# Patient Record
Sex: Female | Born: 1945
Health system: Southern US, Community
[De-identification: ages and names within clinical notes are randomized; demographics above are authoritative.]

## PROBLEM LIST (undated history)

## (undated) DIAGNOSIS — E079 Disorder of thyroid, unspecified: Secondary | ICD-10-CM

## (undated) DIAGNOSIS — K5790 Diverticulosis of intestine, part unspecified, without perforation or abscess without bleeding: Secondary | ICD-10-CM

## (undated) DIAGNOSIS — I10 Essential (primary) hypertension: Secondary | ICD-10-CM

## (undated) DIAGNOSIS — H269 Unspecified cataract: Secondary | ICD-10-CM

## (undated) DIAGNOSIS — Z87442 Personal history of urinary calculi: Secondary | ICD-10-CM

## (undated) DIAGNOSIS — I2510 Atherosclerotic heart disease of native coronary artery without angina pectoris: Secondary | ICD-10-CM

## (undated) DIAGNOSIS — I350 Nonrheumatic aortic (valve) stenosis: Secondary | ICD-10-CM

## (undated) DIAGNOSIS — C801 Malignant (primary) neoplasm, unspecified: Secondary | ICD-10-CM

## (undated) DIAGNOSIS — M1990 Unspecified osteoarthritis, unspecified site: Secondary | ICD-10-CM

## (undated) DIAGNOSIS — Z8719 Personal history of other diseases of the digestive system: Secondary | ICD-10-CM

## (undated) DIAGNOSIS — N390 Urinary tract infection, site not specified: Secondary | ICD-10-CM

## (undated) DIAGNOSIS — M109 Gout, unspecified: Secondary | ICD-10-CM

## (undated) DIAGNOSIS — E785 Hyperlipidemia, unspecified: Secondary | ICD-10-CM

## (undated) DIAGNOSIS — I639 Cerebral infarction, unspecified: Secondary | ICD-10-CM

## (undated) DIAGNOSIS — R0989 Other specified symptoms and signs involving the circulatory and respiratory systems: Secondary | ICD-10-CM

## (undated) DIAGNOSIS — F40240 Claustrophobia: Secondary | ICD-10-CM

## (undated) DIAGNOSIS — Z8249 Family history of ischemic heart disease and other diseases of the circulatory system: Secondary | ICD-10-CM

## (undated) DIAGNOSIS — R011 Cardiac murmur, unspecified: Secondary | ICD-10-CM

## (undated) DIAGNOSIS — K589 Irritable bowel syndrome without diarrhea: Secondary | ICD-10-CM

## (undated) DIAGNOSIS — K219 Gastro-esophageal reflux disease without esophagitis: Secondary | ICD-10-CM

## (undated) HISTORY — PX: CARDIAC CATHETERIZATION: SHX172

## (undated) HISTORY — PX: COLONOSCOPY: SHX174

## (undated) HISTORY — DX: Gout, unspecified: M10.9

## (undated) HISTORY — DX: Gastro-esophageal reflux disease without esophagitis: K21.9

## (undated) HISTORY — DX: Cerebral infarction, unspecified: I63.9

## (undated) HISTORY — PX: OTHER SURGICAL HISTORY: SHX169

## (undated) HISTORY — PX: OOPHORECTOMY: SHX86

## (undated) HISTORY — DX: Hyperlipidemia, unspecified: E78.5

## (undated) HISTORY — DX: Unspecified cataract: H26.9

## (undated) HISTORY — PX: BLADDER SURGERY: SHX569

## (undated) HISTORY — PX: PARTIAL HYSTERECTOMY: SHX80

## (undated) HISTORY — PX: APPENDECTOMY: SHX54

## (undated) HISTORY — DX: Nonrheumatic aortic (valve) stenosis: I35.0

## (undated) HISTORY — DX: Family history of ischemic heart disease and other diseases of the circulatory system: Z82.49

## (undated) HISTORY — PX: EYE SURGERY: SHX253

## (undated) HISTORY — DX: Urinary tract infection, site not specified: N39.0

---

## 1999-02-15 ENCOUNTER — Other Ambulatory Visit: Admission: RE | Admit: 1999-02-15 | Discharge: 1999-02-15 | Payer: Self-pay | Admitting: Gastroenterology

## 1999-08-26 ENCOUNTER — Other Ambulatory Visit: Admission: RE | Admit: 1999-08-26 | Discharge: 1999-08-26 | Payer: Self-pay | Admitting: Family Medicine

## 1999-10-06 ENCOUNTER — Ambulatory Visit: Admission: RE | Admit: 1999-10-06 | Discharge: 1999-10-06 | Payer: Self-pay | Admitting: Gynecology

## 1999-10-08 ENCOUNTER — Encounter: Admission: RE | Admit: 1999-10-08 | Discharge: 1999-10-08 | Payer: Self-pay | Admitting: Gynecology

## 1999-10-11 ENCOUNTER — Encounter: Payer: Self-pay | Admitting: Obstetrics and Gynecology

## 1999-10-11 ENCOUNTER — Ambulatory Visit: Admission: RE | Admit: 1999-10-11 | Discharge: 1999-10-11 | Payer: Self-pay | Admitting: Obstetrics and Gynecology

## 1999-10-19 ENCOUNTER — Inpatient Hospital Stay (HOSPITAL_COMMUNITY): Admission: RE | Admit: 1999-10-19 | Discharge: 1999-10-22 | Payer: Self-pay | Admitting: Obstetrics and Gynecology

## 1999-10-19 ENCOUNTER — Encounter (INDEPENDENT_AMBULATORY_CARE_PROVIDER_SITE_OTHER): Payer: Self-pay

## 2001-03-12 ENCOUNTER — Ambulatory Visit (HOSPITAL_COMMUNITY): Admission: RE | Admit: 2001-03-12 | Discharge: 2001-03-12 | Payer: Self-pay | Admitting: Surgery

## 2001-03-12 ENCOUNTER — Encounter: Payer: Self-pay | Admitting: Surgery

## 2001-05-10 ENCOUNTER — Encounter: Payer: Self-pay | Admitting: Emergency Medicine

## 2001-05-10 ENCOUNTER — Emergency Department (HOSPITAL_COMMUNITY): Admission: EM | Admit: 2001-05-10 | Discharge: 2001-05-10 | Payer: Self-pay | Admitting: Emergency Medicine

## 2002-03-08 ENCOUNTER — Other Ambulatory Visit: Admission: RE | Admit: 2002-03-08 | Discharge: 2002-03-08 | Payer: Self-pay | Admitting: Urology

## 2002-04-10 ENCOUNTER — Ambulatory Visit (HOSPITAL_BASED_OUTPATIENT_CLINIC_OR_DEPARTMENT_OTHER): Admission: RE | Admit: 2002-04-10 | Discharge: 2002-04-10 | Payer: Self-pay | Admitting: Urology

## 2002-04-23 DIAGNOSIS — D126 Benign neoplasm of colon, unspecified: Secondary | ICD-10-CM

## 2003-01-14 ENCOUNTER — Other Ambulatory Visit: Admission: RE | Admit: 2003-01-14 | Discharge: 2003-01-14 | Payer: Self-pay | Admitting: Urology

## 2003-01-17 ENCOUNTER — Other Ambulatory Visit: Admission: RE | Admit: 2003-01-17 | Discharge: 2003-01-17 | Payer: Self-pay | Admitting: Obstetrics and Gynecology

## 2003-02-12 ENCOUNTER — Ambulatory Visit (HOSPITAL_BASED_OUTPATIENT_CLINIC_OR_DEPARTMENT_OTHER): Admission: RE | Admit: 2003-02-12 | Discharge: 2003-02-12 | Payer: Self-pay | Admitting: Urology

## 2004-03-04 ENCOUNTER — Ambulatory Visit (HOSPITAL_BASED_OUTPATIENT_CLINIC_OR_DEPARTMENT_OTHER): Admission: RE | Admit: 2004-03-04 | Discharge: 2004-03-04 | Payer: Self-pay | Admitting: Orthopedic Surgery

## 2004-03-04 ENCOUNTER — Ambulatory Visit (HOSPITAL_COMMUNITY): Admission: RE | Admit: 2004-03-04 | Discharge: 2004-03-04 | Payer: Self-pay | Admitting: Orthopedic Surgery

## 2005-03-16 ENCOUNTER — Other Ambulatory Visit: Admission: RE | Admit: 2005-03-16 | Discharge: 2005-03-16 | Payer: Self-pay | Admitting: Obstetrics and Gynecology

## 2005-04-01 ENCOUNTER — Ambulatory Visit: Payer: Self-pay | Admitting: Gastroenterology

## 2005-04-12 ENCOUNTER — Ambulatory Visit: Payer: Self-pay | Admitting: Gastroenterology

## 2005-04-12 DIAGNOSIS — K5730 Diverticulosis of large intestine without perforation or abscess without bleeding: Secondary | ICD-10-CM | POA: Insufficient documentation

## 2005-12-21 ENCOUNTER — Ambulatory Visit (HOSPITAL_COMMUNITY): Admission: RE | Admit: 2005-12-21 | Discharge: 2005-12-21 | Payer: Self-pay | Admitting: Urology

## 2006-01-09 ENCOUNTER — Ambulatory Visit (HOSPITAL_COMMUNITY): Admission: RE | Admit: 2006-01-09 | Discharge: 2006-01-09 | Payer: Self-pay | Admitting: Urology

## 2008-02-10 ENCOUNTER — Emergency Department (HOSPITAL_COMMUNITY): Admission: EM | Admit: 2008-02-10 | Discharge: 2008-02-10 | Payer: Self-pay | Admitting: Emergency Medicine

## 2008-06-26 DIAGNOSIS — E781 Pure hyperglyceridemia: Secondary | ICD-10-CM

## 2008-06-26 DIAGNOSIS — E039 Hypothyroidism, unspecified: Secondary | ICD-10-CM | POA: Insufficient documentation

## 2008-06-26 DIAGNOSIS — E7800 Pure hypercholesterolemia, unspecified: Secondary | ICD-10-CM

## 2008-07-01 ENCOUNTER — Ambulatory Visit: Payer: Self-pay | Admitting: Gastroenterology

## 2008-07-01 DIAGNOSIS — K219 Gastro-esophageal reflux disease without esophagitis: Secondary | ICD-10-CM

## 2008-07-01 DIAGNOSIS — K625 Hemorrhage of anus and rectum: Secondary | ICD-10-CM | POA: Insufficient documentation

## 2008-07-02 ENCOUNTER — Telehealth: Payer: Self-pay | Admitting: Gastroenterology

## 2008-07-07 ENCOUNTER — Ambulatory Visit: Payer: Self-pay | Admitting: Gastroenterology

## 2009-04-08 ENCOUNTER — Emergency Department (HOSPITAL_BASED_OUTPATIENT_CLINIC_OR_DEPARTMENT_OTHER): Admission: EM | Admit: 2009-04-08 | Discharge: 2009-04-08 | Payer: Self-pay | Admitting: Emergency Medicine

## 2009-04-08 ENCOUNTER — Ambulatory Visit: Payer: Self-pay | Admitting: Diagnostic Radiology

## 2011-02-23 ENCOUNTER — Other Ambulatory Visit: Payer: Self-pay | Admitting: Dermatology

## 2011-04-06 LAB — BASIC METABOLIC PANEL
BUN: 17 mg/dL (ref 6–23)
Creatinine, Ser: 0.9 mg/dL (ref 0.4–1.2)
GFR calc non Af Amer: >60 mL/min (ref >60)
Glucose, Bld: 131 mg/dL — ABNORMAL HIGH (ref 70–99)
Potassium: 3.7 mEq/L (ref 3.5–5.1)

## 2011-04-06 LAB — POCT CARDIAC MARKERS: Troponin i, poc: <0.05 ng/mL (ref 0.00–0.09)

## 2011-05-13 NOTE — Op Note (Signed)
[name] [name], [name] [name]               ACCOUNT NO.:  [id number]   MEDICAL RECORD NO.:  [id number]          PATIENT TYPE:  AMB   LOCATION:  DAY                          FACILITY:  [facility]   PHYSICIAN:  Houston M. Kimbrough, M.D.DATE OF BIRTH:  Mar 20, 1946   DATE OF PROCEDURE:  12/21/2005  DATE OF DISCHARGE:                                 OPERATIVE REPORT   PREOPERATIVE DIAGNOSIS:  Left ureteral, renal calculus with obstruction,  hematuria, hemangioma left lateral wall.   POSTOPERATIVE DIAGNOSIS:  Left ureteral, renal calculus with obstruction,  hematuria, hemangioma left lateral wall.   OPERATION:  Cystoscopy, left retrograde pyelogram, insertion left ureteral  stent, fulguration of bladder lesion left lateral wall.   ANESTHESIA:  General.   SURGEON:  Courtney Paris, M.D.   BRIEF HISTORY:  This 65 year old lady presented with acute left flank pain  and findings on CT scan of an obstructing 1 cm stone in the proximal left  ureter and a 2 cm in her left mid kidney. She had had previous renal  lasertripsy of a primarily uric acid stone over 2 years ago but these are  now seen on KUB and have calcifications and most likely have calcium in  them. She is heading to [place] to spend a week with her sister right after  New Years and enters now for cysto and retrograde pyelogram and insertion of  left ureteral stent prior to anticipated lithotripsy in about 3 weeks.   DESCRIPTION OF PROCEDURE:  The patient was placed on the operating table in  the dorsal lithotomy position and after satisfactory induction of general  anesthesia was prepped and draped with Betadine in the usual sterile  fashion. The scope was inserted and the bladder inspected. There was a  little hemangioma in the left lateral wall which was photographed. A left  ureteral orifice was catheterized with a six open-ended ureteral catheter  and occlusive retrograde was done. This demonstrated an obstructing stone in  the  proximal left ureter at the UPJ and a large stone in the left mid  kidney. The ureter was normal and nonobstructing down to the bladder.   A floppy-tip guidewire was then passed up the open-ended catheter under  fluoroscopy past the stone into the renal pelvis and the open-ended catheter  was then removed. Over the guidewire, a 6-French x 24 cm length double-J  ureteral stent was then passed but when  I removed the guidewire, it became  apparent that this was a little too short for the patient. The fact that she  is going to have it for nearly a month, I elected to pull the end of the  stent out with forceps beyond the urethral meatus, passed another guidewire  up the stent under fluoroscopy and then removed the stent. I backloaded the  cystoscope over the guidewire and then passed a new 6-French x 26 cm stent  which seemed to be adequate length and position. There was a nice coil in  the renal pelvis and one in the bladder, scope was removed. Saline was then  discontinued and water was started and using  the Bugbee electrode, the  little hemangioma  in the left lateral wall was then fulgurated. Pictures were then made of  this as well. Hemostasis was quite good and the bladder drained. She was  given a B&O suppository and some Toradol and taken to the recovery room in  good condition to be later discharged as an outpatient.      Courtney Paris, M.D.  Electronically Signed     HMK/MEDQ  D:  12/21/2005  T:  12/22/2005  Job:  [id number]

## 2011-05-13 NOTE — Consult Note (Signed)
[facility]  Patient:    [name] [name], [name] [name]                 MRN: [id number] Proc. Date: 05/10/01 Adm. Date:  [id number] Attending:  Donnetta Hutching CC:         Angelena Sole, M.D. [facility]   Consultation Report  DATE OF BIRTH:  [birth year]  REQUESTING PHYSICIAN:  [facility].  REASON FOR EVALUATION:  Left leg numbness.  HISTORY OF PRESENT ILLNESS:  This is the initial emergency room consultation evaluation of this 65 year old woman with a past medical history including hypertension, hypercholesterolemia and hypothyroidism who one week ago, went to [facility] after developing left facial numbness.  She was diagnosed with a presumed cerebrovascular event at that time and was treated with Plavix and increase in her Zestril.  She subsequently had an MRI at [facility], the report of which is available today, which showed an old left basal ganglia lacunar stroke but no apparent acute infarction.  Per the MRI technician at [facility], this was done with diffusion-weighted imaging.  She comes to the ER today because this morning she was standing in her kitchen making soup and experienced acute tingling and numbness in the left foot up to the ankle.  There was no associated weakness, slurring of speech or vision changes.  She reported that she has been set up for outpatient carotid Doppler and echocardiogram and that this is being arranged. She states her symptoms have improved a little bit since being in the ER.  PAST MEDICAL HISTORY:  Hypertension, up a little bit more recently; hypercholesterolemia; no known history of diabetes; she is also hypothyroid and has gastroesophageal reflux disease.  FAMILY HISTORY:  She reports that her mother had carotid disease and had an endarterectomy.  SOCIAL HISTORY:  She lives with her husband.  She does not smoke.  MEDICATIONS:  Plavix for the last two to three days only,  Zestril, levothyroxine, Prevacid, Zocor.  REVIEW OF SYSTEMS:  No headache, loss of consciousness, dizziness.  No chest pain, palpitations.  No trouble breathing.  No nausea, vomiting, abdominal pain, diarrhea.  She does have chronic neck and back pain and occasionally some radiating pain, especially on the left.  PHYSICAL EXAMINATION:  VITAL SIGNS:  Temperature 97.2, blood pressure 155/91, pulse 96, respirations 20.  GENERAL:  She is alert in no evident distress.  NECK:  Supple without carotid bruit.  HEENT:  Cranium is normocephalic and atraumatic.  Oropharynx is benign.  HEART:  Regular rate and rhythm without murmurs.  NEUROLOGIC:  Mental status:  She is awake, alert and completely oriented. Mood is euthymic and affect appropriate.  Speech is fluent and not dysarthric. Cranial nerves:  Funduscopic exam is benign.  Pupils are equal and briskly reactive.  Extraocular movements are normal without nystagmus.  Visual fields are full to confrontation.  Facial strength and sensation are normal.  Tongue and palate move normally and in the midline.  Motor exam:  Normal bulk and tone.  No atrophy or fasciculations.  Normal strength in all tested extremity muscles.  Sensation is intact to light touch, vibration and pinprick in all extremities.  Reflexes are 2+ and symmetric.  Toes are downgoing. Finger-to-nose and heel-to-shin are performed well.  Gait exam is deferred.  LABORATORY REVIEW:  CBC is normal.  Coagulations are normal. CMET is normal except for an elevated glucose of 129.  CT of the head is personally reviewed and  reveals an old left basal ganglia lacunar without evident acute findings.  IMPRESSION: 1. Left foot numbness.  Etiology of this is unclear.  It is possibly a very    small pure sensory ______ , though I more likely would favor a referred    pain from her low back. 2. Numbness in the left thigh, most likely meralgia paresthetica. 3. Increased glucose of unclear  significance. 4. Hypertension. 5. Hypercholesterolemia.  PLAN: 1. Continue Plavix. 2. Will add baby aspirin 81 mg q.d. 3. Continue Zestril and Zocor. 4. Outpatient Doppler and MRI with cranial MRA.  This should be done within    about a week. 5. Follow up with [facility] as appropriate. DD:  05/10/01 TD:  05/11/01 Job: [id number]

## 2011-05-13 NOTE — Op Note (Signed)
[facility]  Patient:    [name] [name], [name] [name] Visit Number: [id number] MRN: [id number]          Service Type: NES Location: NESC Attending Physician:  Katherine Roan Dictated by:   Rozanna Boer., M.D. Proc. Date: 04/10/02 Admit Date:  04/10/2002                             Operative Report  PREOPERATIVE DIAGNOSES:  Left mid renal calcification, hematuria.  POSTOPERATIVE DIAGNOSES:  Left mid renal calcification, hematuria.  PROCEDURE:  Cystoscopy, left retrograde pyelogram.  ANESTHESIA:  General.  SURGEON:  Rozanna Boer., M.D.  BRIEF HISTORY:  This 65 year old white female was admitted with left mid renal calcification for cystoscopy, retrograde, and further evaluation.  She possibly had a stone in the past but has never recovered one.  She usually has less than one UTI per year.  No irritative bladder symptoms.  A CT scan before this did show that she had two, around 5 mm stones in the left mid pole calix on the left side and some liver hemangiomas that were stable.  There is no hydronephrosis present.  DESCRIPTION OF PROCEDURE:  After satisfactory preoperative evaluation, she underwent successful induction of general anesthesia and was prepped and draped with Betadine in the usual sterile fashion.  A 22 panendoscope was inserted, and the the bladder was careful inspected using the right angle and the four oblique lens.  No bladder mucosal lesions seen, no tumors.  Trigone appeared normal.  There was clear efflux bilaterally.  A 6 open-ended ureteral catheter was inserted in the left orifice, and an occlusive retrograde was done, filling out the filling defect in the left mid pole calix which from previous studies appeared to be two 5 mm stones.  There was some blunting in the upper pole calices, but the ureter drained well on postdrainage film.  The bladder was drained and a B&O suppository inserted, and she  was taken to the recovery room in good condition to be later discharged as an outpatient.  She may need lithotripsy for these stones in the future, but this will be ascertained at a later time. Dictated by:   Rozanna Boer., M.D. Attending Physician:  Katherine Roan DD:  04/10/02 TD:  04/11/02 Job: [id number]

## 2011-05-13 NOTE — Op Note (Signed)
NAME:  [name] [name], [name] [name]                         ACCOUNT NO.:  [id number]   MEDICAL RECORD NO.:  [id number]                   PATIENT TYPE:  AMB   LOCATION:  NESC                                 FACILITY:  [facility]   PHYSICIAN:  Rozanna Boer., M.D.      DATE OF BIRTH:  02-12-1946   DATE OF PROCEDURE:  02/12/2003  DATE OF DISCHARGE:                                 OPERATIVE REPORT   PREOPERATIVE DIAGNOSIS:  Left renal calculus.   POSTOPERATIVE DIAGNOSIS:  Left renal calculus.   OPERATION:  1. Flexible ureteroscopy with holmium lithotripsy.  2. Insertion of stent.   ANESTHESIA:  General.   SURGEON:  Courtney Paris, M.D.   BRIEF HISTORY:  This 66 year old patient was admitted with some  complications of the left kidney that had doubled in size and was thought to  be possibly in a caliceal diverticulum.  Cytology was reactive, does have  hematuria, now has a 20 x 12 mm faintly calcified stone in the mid left  kidney.  Last year was 12 x 10.  She had a TAH in 1988, cystocele in 1989,  ovarian cystectomy in 2000.   DESCRIPTION OF PROCEDURE:  The patient was placed on the operating table in  the dorsal lithotomy position after satisfactory induction of general  anesthesia, was prepped and draped with Betadine in the usual sterile  fashion.  The 21 panendoscope was inserted.  The bladder was smooth,  nontrabeculated, and had no bladder mucosal lesions seen.  The left orifice  was catheterized with a .038 floppy-tip guidewire which under fluoroscopy  was passed up to the level of the kidney.  Over the guidewire, a 55 cm  ureteral access sheath was passed over the guidewire under fluoroscopy up to  the level of the UP junction.  The Stylet was then removed and through the  ureteral access sheath, a 6 French flexible ureteroscope was passed into the  kidney.  The stone in the mid pole calix was seen.  It was quite large, and  there was not a caliceal diverticulum that I  could see.  With the holmium  laser at  3.2 watts, I was able to break the stone into tiny pieces.  I then  removed the scope and inserted the guidewire back to the access sheath and  removed the access sheath.  Over the guidewire, a 6 French x 24 cm length  double-J ureteral stent was passed under fluoroscopy in good position, as  the guidewire was then removed.  The bladder was drained, scope removed, and  B&O suppository inserted.  The patient was then taken to the recovery room  in good condition to be later discharged as an outpatient.  Rozanna Boer., M.D.    HMK/MEDQ  D:  02/12/2003  T:  02/12/2003  Job:  [id number]

## 2011-05-13 NOTE — Op Note (Signed)
NAME:  [name] [name], [name] [name]                         ACCOUNT NO.:  [id number]   MEDICAL RECORD NO.:  [id number]                   PATIENT TYPE:  AMB   LOCATION:  DSC                                  FACILITY:  [facility]   PHYSICIAN:  Katy Fitch. Naaman Plummer., M.D.          DATE OF BIRTH:  Dec 06, 1946   DATE OF PROCEDURE:  03/04/2004  DATE OF DISCHARGE:                                 OPERATIVE REPORT   PREOPERATIVE DIAGNOSIS:  Chronic lesion, radial aspect of right ring finger  radial nail fold, consistent with draining mucous cyst.   POSTOPERATIVE DIAGNOSIS:  Chronic lesion, radial aspect of right ring finger  radial nail fold, consistent with draining mucous cyst.   OPERATION:  Debridement of mucoid cyst and debridement of osteophytes at  distal interphalangeal joint, right ring finger.   OPERATING SURGEON:  Katy Fitch. Sypher, M.D.   ASSISTANT:  Jonni Sanger, P.A.   ANESTHESIA:  0.25% Marcaine and 2% lidocaine metacarpal-head level block,  right ring finger, supplemented by IV sedation supervised by the  anesthesiologist, Janetta Hora. Gelene Mink, M.D.   INDICATIONS:  [name] is a 65 year old woman who was referred by her  family physician, Dr. Ivory Broad, for evaluation of a chronic lesion on the  radial nail fold of her right ring finger.   She had had a course of three separate antibiotics without resolution.  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   She requested surgical management.   DESCRIPTION OF PROCEDURE:  Konstantina Nachreiner is brought to the operating room  and placed in the supine position on the operating table.  Following routine  Betadine scrub and paint, the right ring finger was anesthetized with 0.25%  Marcaine and 2% lidocaine infiltrated at the metacarpal  head level.   When anesthesia in the ring finger was satisfactory, the finger was  exsanguinated with a gauze wrap and a quarter-inch  Penrose drain at the base  of the finger was used as a digital tourniquet.  The procedure commenced  with an elliptical excision of the sinus tract and the lesion at the radial  nail fold.  This was removed piecemeal.  The radial nail fold was then  incised back to the level of the DIP joint.  The capsule was entered and the  marginal osteophytes excised.   Care was taken to spread through Cleland's ligaments to be certain there was  not a glomus tumor deep to the ligaments or other lesion.   The wound was then thoroughly irrigated and repaired with interrupted suture  of 5-0 nylon.   There were no apparent complications.   For aftercare Ms. Gerstenberger has a prescription for oxycodone, which she will  use.  She is also given a prescription for Levaquin 500 mg one p.o. daily x4  days as a prophylactic antibiotic.  Katy Fitch Naaman Plummer., M.D.    RVS/MEDQ  D:  03/04/2004  T:  03/05/2004  Job:  [id number]

## 2011-05-27 ENCOUNTER — Other Ambulatory Visit: Payer: Self-pay | Admitting: Dermatology

## 2011-09-06 ENCOUNTER — Other Ambulatory Visit: Payer: Self-pay | Admitting: Dermatology

## 2011-10-04 ENCOUNTER — Other Ambulatory Visit: Payer: Self-pay | Admitting: Dermatology

## 2012-03-13 ENCOUNTER — Other Ambulatory Visit: Payer: Self-pay | Admitting: Dermatology

## 2012-09-11 ENCOUNTER — Other Ambulatory Visit: Payer: Self-pay | Admitting: Dermatology

## 2013-06-11 ENCOUNTER — Emergency Department (HOSPITAL_BASED_OUTPATIENT_CLINIC_OR_DEPARTMENT_OTHER)
Admission: EM | Admit: 2013-06-11 | Discharge: 2013-06-11 | Disposition: A | Discharge status: Home / Self Care | Payer: Medicare Other | Attending: Emergency Medicine | Admitting: Emergency Medicine

## 2013-06-11 ENCOUNTER — Encounter (HOSPITAL_BASED_OUTPATIENT_CLINIC_OR_DEPARTMENT_OTHER): Payer: Self-pay | Admitting: *Deleted

## 2013-06-11 ENCOUNTER — Emergency Department (HOSPITAL_BASED_OUTPATIENT_CLINIC_OR_DEPARTMENT_OTHER): Payer: Medicare Other

## 2013-06-11 DIAGNOSIS — N23 Unspecified renal colic: Secondary | ICD-10-CM | POA: Insufficient documentation

## 2013-06-11 DIAGNOSIS — R112 Nausea with vomiting, unspecified: Secondary | ICD-10-CM | POA: Insufficient documentation

## 2013-06-11 DIAGNOSIS — Z90710 Acquired absence of both cervix and uterus: Secondary | ICD-10-CM | POA: Insufficient documentation

## 2013-06-11 DIAGNOSIS — Z79899 Other long term (current) drug therapy: Secondary | ICD-10-CM | POA: Insufficient documentation

## 2013-06-11 DIAGNOSIS — E079 Disorder of thyroid, unspecified: Secondary | ICD-10-CM | POA: Insufficient documentation

## 2013-06-11 DIAGNOSIS — Z880 Allergy status to penicillin: Secondary | ICD-10-CM | POA: Insufficient documentation

## 2013-06-11 DIAGNOSIS — Z87442 Personal history of urinary calculi: Secondary | ICD-10-CM | POA: Insufficient documentation

## 2013-06-11 DIAGNOSIS — I10 Essential (primary) hypertension: Secondary | ICD-10-CM | POA: Insufficient documentation

## 2013-06-11 HISTORY — DX: Disorder of thyroid, unspecified: E07.9

## 2013-06-11 HISTORY — DX: Essential (primary) hypertension: I10

## 2013-06-11 LAB — URINALYSIS, ROUTINE W REFLEX MICROSCOPIC
Glucose, UA: 100 mg/dL — AB
Nitrite: NEGATIVE
Protein, ur: NEGATIVE mg/dL
pH: 5.5 (ref 5.0–8.0)

## 2013-06-11 LAB — URINE MICROSCOPIC-ADD ON

## 2013-06-11 LAB — CBC WITH DIFFERENTIAL/PLATELET
Basophils Absolute: 0 10*3/uL (ref 0.0–0.1)
Basophils Relative: 0 % (ref 0–1)
HCT: 42.4 % (ref 36.0–46.0)
Lymphocytes Relative: 17 % (ref 12–46)
Monocytes Absolute: 0.7 10*3/uL (ref 0.1–1.0)
Neutro Abs: 9.5 10*3/uL — ABNORMAL HIGH (ref 1.7–7.7)
Neutrophils Relative %: 77 % (ref 43–77)
RDW: 14.6 % (ref 11.5–15.5)
WBC: 12.4 10*3/uL — ABNORMAL HIGH (ref 4.0–10.5)

## 2013-06-11 LAB — BASIC METABOLIC PANEL
CO2: 25 mEq/L (ref 19–32)
Chloride: 100 mEq/L (ref 96–112)
Creatinine, Ser: 0.8 mg/dL (ref 0.50–1.10)
GFR calc Af Amer: 85 mL/min — ABNORMAL LOW (ref >90)
Potassium: 2.9 mEq/L — ABNORMAL LOW (ref 3.5–5.1)
Sodium: 140 mEq/L (ref 135–145)

## 2013-06-11 MED ORDER — KETOROLAC TROMETHAMINE 15 MG/ML IJ SOLN
15.0000 mg | Freq: Once | INTRAMUSCULAR | Status: AC
  Administered 2013-06-11: 15 mg via INTRAVENOUS
  Filled 2013-06-11: qty 1

## 2013-06-11 MED ORDER — ONDANSETRON HCL 4 MG/2ML IJ SOLN
4.0000 mg | Freq: Once | INTRAMUSCULAR | Status: AC
  Administered 2013-06-11: 4 mg via INTRAVENOUS
  Filled 2013-06-11: qty 2

## 2013-06-11 MED ORDER — METOCLOPRAMIDE HCL 5 MG/ML IJ SOLN
INTRAMUSCULAR | Status: AC
  Administered 2013-06-11: 5 mg via INTRAVENOUS
  Filled 2013-06-11: qty 2

## 2013-06-11 MED ORDER — NAPROXEN SODIUM 275 MG PO TABS: ORAL_TABLET | ORAL | Status: DC

## 2013-06-11 MED ORDER — PROMETHAZINE HCL 25 MG PO TABS: 25.0000 mg | ORAL_TABLET | Freq: Four times a day (QID) | ORAL | Status: DC | PRN

## 2013-06-11 MED ORDER — METOCLOPRAMIDE HCL 5 MG/ML IJ SOLN: 5.0000 mg | Freq: Once | INTRAMUSCULAR | Status: AC

## 2013-06-11 MED ORDER — SODIUM CHLORIDE 0.9 % IV SOLN
INTRAVENOUS | Status: DC
  Administered 2013-06-11: 04:00:00 via INTRAVENOUS

## 2013-06-11 MED ORDER — TAMSULOSIN HCL 0.4 MG PO CAPS: ORAL_CAPSULE | ORAL | Status: DC

## 2013-06-11 MED ORDER — FENTANYL CITRATE 0.05 MG/ML IJ SOLN
100.0000 ug | Freq: Once | INTRAMUSCULAR | Status: AC
  Administered 2013-06-11: 100 ug via INTRAVENOUS
  Filled 2013-06-11: qty 2

## 2013-06-11 MED ORDER — OXYCODONE-ACETAMINOPHEN 10-325 MG PO TABS: 0.5000 | ORAL_TABLET | ORAL | Status: DC | PRN

## 2013-06-11 NOTE — ED Provider Notes (Signed)
History     CSN: [id number]  Arrival date & time 06/11/13  4098   First MD Initiated Contact with Patient 06/11/13 0405      Chief Complaint  Patient presents with  . Flank Pain    (Consider location/radiation/quality/duration/timing/severity/associated sxs/prior treatment) HPI Level 5 Caveat: severe, distracting pain. This is a 67 year old female with right flank pain that woke her from sleep approximately 1:30 this morning. The pain is severe and is characterized as similar to prior or kidney stone. The pain does not change with movement or palpation. She took 2 tramadol without relief. She has had associated nausea and a small amount of vomiting with this. She has not noticed any hematuria.  Past Medical History  Diagnosis Date  . Hypertension   . Thyroid disease     Past Surgical History  Procedure Laterality Date  . Abdominal hysterectomy    . Bladder lift      No family history on file.  History  Substance Use Topics  . Smoking status: Never Smoker   . Smokeless tobacco: Not on file  . Alcohol Use: No    OB History   Grav Para Term Preterm Abortions TAB SAB Ect Mult Living                  Review of Systems  Unable to perform ROS   Allergies  Ampicillin and Clopidogrel bisulfate  Home Medications   Current Outpatient Rx  Name  Route  Sig  Dispense  Refill  . levothyroxine (SYNTHROID, LEVOTHROID) 125 MCG tablet   Oral   Take 125 mcg by mouth daily before breakfast.         . olmesartan (BENICAR) 40 MG tablet   Oral   Take 40 mg by mouth daily.           BP 185/92  Pulse 74  Temp(Src) 98.2 F (36.8 C) (Oral)  Resp 18  SpO2 100%  Physical Exam General: Well-developed, well-nourished female; appearance consistent with age of record HENT: normocephalic, atraumatic Eyes: pupils equal round and reactive to light; extraocular muscles intact Neck: supple Heart: regular rate and rhythm Lungs: clear to auscultation bilaterally Abdomen:  soft; nondistended; nontender; no masses or hepatosplenomegaly; bowel sounds present GU: No CVA tenderness; clear, yellow urine Extremities: No deformity; full range of motion; pulses normal Neurologic: Awake, alert; motor function intact in all extremities and symmetric; no facial droop Skin: Warm and dry Psychiatric: Moaning in obvious discomfort    ED Course  Procedures (including critical care time)     MDM   Nursing notes and vitals signs, including pulse oximetry, reviewed.  Summary of this visit's results, reviewed by myself:  Labs:  Results for orders placed during the hospital encounter of 06/11/13 (from the past 24 hour(s))  CBC WITH DIFFERENTIAL     Status: Abnormal   Collection Time    06/11/13  4:00 AM      Result Value Range   WBC 12.4 (*) 4.0 - 10.5 K/uL   RBC 4.92  3.87 - 5.11 MIL/uL   Hemoglobin 14.5  12.0 - 15.0 g/dL   HCT 11.9  14.7 - 82.9 %   MCV 86.2  78.0 - 100.0 fL   MCH 29.5  26.0 - 34.0 pg   MCHC 34.2  30.0 - 36.0 g/dL   RDW 56.2  13.0 - 86.5 %   Platelets 217  150 - 400 K/uL   Neutrophils Relative % 77  43 - 77 %  Neutro Abs 9.5 (*) 1.7 - 7.7 K/uL   Lymphocytes Relative 17  12 - 46 %   Lymphs Abs 2.1  0.7 - 4.0 K/uL   Monocytes Relative 6  3 - 12 %   Monocytes Absolute 0.7  0.1 - 1.0 K/uL   Eosinophils Relative 0  0 - 5 %   Eosinophils Absolute 0.0  0.0 - 0.7 K/uL   Basophils Relative 0  0 - 1 %   Basophils Absolute 0.0  0.0 - 0.1 K/uL  BASIC METABOLIC PANEL     Status: Abnormal   Collection Time    06/11/13  4:00 AM      Result Value Range   Sodium 140  135 - 145 mEq/L   Potassium 2.9 (*) 3.5 - 5.1 mEq/L   Chloride 100  96 - 112 mEq/L   CO2 25  19 - 32 mEq/L   Glucose, Bld 200 (*) 70 - 99 mg/dL   BUN 21  6 - 23 mg/dL   Creatinine, Ser 1.61  0.50 - 1.10 mg/dL   Calcium 09.6  8.4 - 04.5 mg/dL   GFR calc non Af Amer 74 (*) >90 mL/min   GFR calc Af Amer 85 (*) >90 mL/min  URINALYSIS, ROUTINE W REFLEX MICROSCOPIC     Status: Abnormal    Collection Time    06/11/13  4:56 AM      Result Value Range   Color, Urine YELLOW  YELLOW   APPearance CLEAR  CLEAR   Specific Gravity, Urine 1.024  1.005 - 1.030   pH 5.5  5.0 - 8.0   Glucose, UA 100 (*) NEGATIVE mg/dL   Hgb urine dipstick LARGE (*) NEGATIVE   Bilirubin Urine NEGATIVE  NEGATIVE   Ketones, ur NEGATIVE  NEGATIVE mg/dL   Protein, ur NEGATIVE  NEGATIVE mg/dL   Urobilinogen, UA 0.2  0.0 - 1.0 mg/dL   Nitrite NEGATIVE  NEGATIVE   Leukocytes, UA SMALL (*) NEGATIVE  URINE MICROSCOPIC-ADD ON     Status: Abnormal   Collection Time    06/11/13  4:56 AM      Result Value Range   Squamous Epithelial / LPF MANY (*) RARE   WBC, UA 3-6  <3 WBC/hpf   RBC / HPF 7-10  <3 RBC/hpf   Bacteria, UA FEW (*) RARE   Casts HYALINE CASTS (*) NEGATIVE   Urine-Other MUCOUS PRESENT      Imaging Studies: Ct Abdomen Pelvis Wo Contrast  06/11/2013   *RADIOLOGY REPORT*  Clinical Data: Right-sided flank pain.  CT ABDOMEN AND PELVIS WITHOUT CONTRAST  Technique:  Multidetector CT imaging of the abdomen and pelvis was performed following the standard protocol without intravenous contrast.  Comparison: CT of the abdomen and pelvis 12/21/2007.  Findings:  Lung Bases: Dependent areas of scarring and/or atelectasis in the lower lobes of the lungs bilaterally.  Moderate sized hiatal hernia.  Atherosclerotic calcifications throughout the mid and distal right coronary artery.  Abdomen/Pelvis:  Image 78 of series 2 demonstrates a 3 mm calcification in the posterior aspect of the urinary bladder at or immediately beyond the right ureterovesicular junction.  There is some mild right-sided hydroureteronephrosis and extensive right- sided perinephric stranding.  No additional calculi are noted within the collecting system of either kidney, along the course of either ureter, or within the lumen of the urinary bladder at this time.  No focal renal lesions are identified on today's noncontrast CT examination.  The  unenhanced appearance of the liver, gallbladder, pancreas,  spleen and bilateral adrenal glands is unremarkable. Atherosclerosis throughout the abdominal and pelvic vasculature, without definite aneurysm or dissection.  There are numerous colonic diverticula, particularly in the descending and sigmoid colon, without surrounding inflammatory changes to suggest acute diverticulitis at this time.  Fecalization of distal small bowel contents is incidentally noted.  Status post hysterectomy.  Ovaries are not confidently identified may be surgically absent or atrophic.  No significant volume of ascites.  No pneumoperitoneum. No pathologic distension of small bowel. No definite pathologic lymphadenopathy identified within the abdomen or pelvis on today's noncontrast CT examination.  Musculoskeletal: There are no aggressive appearing lytic or blastic lesions noted in the visualized portions of the skeleton.  IMPRESSION: 1.  3 mm calcification along the posterior aspect of the urinary bladder.  This is either at or immediately beyond the right ureterovesicular junction.  The mild right-sided hydroureteronephrosis and extensive right-sided perinephric stranding are either indicative of residual mild obstruction, or are sequelae of recent obstruction in the setting of a recently passed stone. 2.  Extensive colonic diverticulosis without findings to suggest acute diverticulitis at this time. 3.  Extensive atherosclerosis, including right coronary artery disease. 4.  Additional incidental findings, as above.   Original Report Authenticated By: Trudie Reed, M.D.   6:30 AM Patient still having some discomfort in the right flank although her severe pain has been well-controlled with fentanyl and Toradol IV. It is unclear if the patient has passed her stone or if it is still lodged at the UVJ. She has not passed it through urine strainer in the ED.         Hanley Seamen, MD 06/11/13 [phone]

## 2013-06-11 NOTE — ED Notes (Signed)
Patient transported to CT 

## 2013-06-11 NOTE — ED Notes (Signed)
Pt c/o right flank pain that woke her from sleep at approx. 1:30 this morning. Pt took 2 tramadol without relief.

## 2013-06-11 NOTE — ED Notes (Signed)
Pt strained her urine but no stone or sediment noted in strainer. Pt c/o nausea. Dr. Read Drivers notified and order placed.

## 2013-06-11 NOTE — ED Notes (Signed)
Immediately after giving fentanyl, pt became somnolent. Pulse ox monitor placed on pt and O2 sat found to be 78. Pt roused to voice and oxygen was applied at 2LPM via [place]. Pt encouraged to breath deeply through her nose and her O2 sat climbed back up to 96.

## 2013-06-12 LAB — URINE CULTURE

## 2013-11-29 ENCOUNTER — Other Ambulatory Visit: Payer: Self-pay | Admitting: Obstetrics and Gynecology

## 2013-12-26 HISTORY — PX: COLONOSCOPY: SHX174

## 2013-12-27 ENCOUNTER — Encounter: Payer: Self-pay | Admitting: Gastroenterology

## 2014-01-22 ENCOUNTER — Encounter: Payer: Self-pay | Admitting: Gastroenterology

## 2014-01-22 ENCOUNTER — Ambulatory Visit (INDEPENDENT_AMBULATORY_CARE_PROVIDER_SITE_OTHER): Payer: No Typology Code available for payment source | Admitting: Gastroenterology

## 2014-01-22 VITALS — BP 140/80 | HR 92 | Ht 65.5 in | Wt 227.0 lb

## 2014-01-22 DIAGNOSIS — D126 Benign neoplasm of colon, unspecified: Secondary | ICD-10-CM

## 2014-01-22 DIAGNOSIS — R195 Other fecal abnormalities: Secondary | ICD-10-CM

## 2014-01-22 NOTE — Assessment & Plan Note (Signed)
Hemoccult-positive stool may be do to polyps, AVMs, less likely neoplasm, or hemorrhoids.  Occult upper GI bleeding source is also a consideration.  Recommendations #1 colonoscopy

## 2014-01-22 NOTE — Patient Instructions (Signed)

## 2014-01-22 NOTE — Progress Notes (Signed)
_                                                                                                                History of Present Illness: Pleasant 68 year old white female referred for Hemoccult-positive stool.  This was noted on routine testing.  She rarely has seen minimal amounts of blood on the toilet tissue which she attributes to hemorrhoids.  She has some difficulty with rectal cleansing.  Colon polyps were removed in the late 1990s and 2003.  Diverticula and hemorrhoids were seen at 2006.  She underwent colonoscopy because of gross hematochezia in 2009.  Bleeding was attributed to internal hemorrhoids.  Bowels tend to be loose and erratic.  She is on no gastric irritants including nonsteroidals.  She denies abdominal pain, melena or hematochezia.    Past Medical History  Diagnosis Date  . Hypertension   . Thyroid disease   . GERD (gastroesophageal reflux disease)   . Kidney stones   . Hyperlipemia   . Gout    Past Surgical History  Procedure Laterality Date  . Partial hysterectomy    . Bladder surgery      Lift   . Oophorectomy     family history is negative for Colon cancer. Current Outpatient Prescriptions  Medication Sig Dispense Refill  . allopurinol (ZYLOPRIM) 100 MG tablet Take 100 mg by mouth daily.      Marland Kitchen amLODipine (NORVASC) 5 MG tablet Take 5 mg by mouth daily.      . lansoprazole (PREVACID) 15 MG capsule Take 15 mg by mouth daily.      Marland Kitchen levothyroxine (SYNTHROID, LEVOTHROID) 125 MCG tablet Take 125 mcg by mouth daily before breakfast.      . naproxen sodium (ANAPROX) 275 MG tablet Take 1 tablet twice daily until stone passes.  30 tablet  0  . olmesartan (BENICAR) 40 MG tablet Take 40 mg by mouth daily.      . rosuvastatin (CRESTOR) 5 MG tablet Take 5 mg by mouth daily.      . traMADol (ULTRAM-ER) 100 MG 24 hr tablet Take 100 mg by mouth daily.       No current facility-administered medications for this visit.   Allergies as of 01/22/2014 -  Review Complete 01/22/2014  Allergen Reaction Noted  . Ampicillin  07/01/2008  . Clopidogrel bisulfate  07/01/2008    reports that she has never smoked. She has never used smokeless tobacco. She reports that she does not drink alcohol or use illicit drugs.     Review of Systems: Pertinent positive and negative review of systems were noted in the above HPI section. All other review of systems were otherwise negative.  Vital signs were reviewed in today's medical record Physical Exam: General: Well developed , well nourished, no acute distress Skin: anicteric Head: Normocephalic and atraumatic Eyes:  sclerae anicteric, EOMI Ears: Normal auditory acuity Mouth: No deformity or lesions Neck: Supple, no masses or thyromegaly Lungs: Clear throughout to auscultation Heart: Regular rate and rhythm; no murmurs,  rubs or bruits Abdomen: Soft, non tender and non distended. No masses, hepatosplenomegaly or hernias noted. Normal Bowel sounds Rectal: There are external skin tags on visual examination Musculoskeletal: Symmetrical with no gross deformities  Skin: No lesions on visible extremities Pulses:  Normal pulses noted Extremities: No clubbing, cyanosis, edema or deformities noted Neurological: Alert oriented x 4, grossly nonfocal Cervical Nodes:  No significant cervical adenopathy Inguinal Nodes: No significant inguinal adenopathy Psychological:  Alert and cooperative. Normal mood and affect  See Assessment and Plan under Problem List

## 2014-01-28 ENCOUNTER — Ambulatory Visit (AMBULATORY_SURGERY_CENTER): Payer: No Typology Code available for payment source | Admitting: Gastroenterology

## 2014-01-28 ENCOUNTER — Encounter: Payer: Self-pay | Admitting: Gastroenterology

## 2014-01-28 VITALS — BP 123/83 | HR 79 | Temp 98.3°F | Resp 18 | Ht 65.5 in | Wt 227.0 lb

## 2014-01-28 DIAGNOSIS — Z860100 Personal history of colon polyps, unspecified: Secondary | ICD-10-CM

## 2014-01-28 DIAGNOSIS — D126 Benign neoplasm of colon, unspecified: Secondary | ICD-10-CM

## 2014-01-28 MED ORDER — SODIUM CHLORIDE 0.9 % IV SOLN: 500.0000 mL | INTRAVENOUS | Status: DC

## 2014-01-28 NOTE — Progress Notes (Signed)
Lidocaine-40mg IV prior to Propofol InductionPropofol given over incremental dosages 

## 2014-01-28 NOTE — Progress Notes (Signed)
Called to room to assist during endoscopic procedure.  Patient ID and intended procedure confirmed with present staff. Received instructions for my participation in the procedure from the performing physician.  

## 2014-01-28 NOTE — Op Note (Signed)
[name]  [name] [place], [address]   COLONOSCOPY PROCEDURE REPORT  PATIENT: [name] [name]  MR#: [id number] BIRTHDATE: [birth year] , 67  yrs. old GENDER: Female ENDOSCOPIST: Inda Castle, MD REFERRED [id number] Seth Bake, M.D. PROCEDURE DATE:  01/28/2014 PROCEDURE:   Colonoscopy with snare polypectomy and Colonoscopy with cold biopsy polypectomy First Screening Colonoscopy - Avg.  risk and is 50 yrs.  old or older - No.  Prior Negative Screening - Now for repeat screening. N/A  History of Adenoma - Now for follow-up colonoscopy & has been > or = to 3 yrs.  Yes hx of adenoma.  Has been 3 or more years since last colonoscopy.  Polyps Removed Today? Yes. ASA CLASS:   Class II INDICATIONS:heme-positive stool. MEDICATIONS: MAC sedation, administered by CRNA and propofol (Diprivan) 250mg  IV  DESCRIPTION OF PROCEDURE:   After the risks benefits and alternatives of the procedure were thoroughly explained, informed consent was obtained.  A digital rectal exam revealed no abnormalities of the rectum.   The LB [id number]  endoscope was introduced through the anus and advanced to the cecum, which was identified by both the appendix and ileocecal valve. No adverse events experienced.   The quality of the prep was Suprep good  The instrument was then slowly withdrawn as the colon was fully examined.      COLON FINDINGS: A sessile polyp measuring 2 mm in size was found at the cecum.  A polypectomy was performed with cold forceps.   A sessile polyp measuring 3 mm in size was found in the descending colon.  A polypectomy was performed with a cold snare.  The resection was complete and the polyp tissue was completely retrieved.   There was severe diverticulosis noted in the sigmoid colon with associated tortuosity, muscular hypertrophy, colonic narrowing and luminal narrowing.   There was moderate scattered diverticulosis noted in the descending colon,  transverse colon, and ascending colon.   Internal hemorrhoids were found.  Retroflexed views revealed no abnormalities. The time to cecum=3 minutes 03 seconds.  Withdrawal time=7 minutes 15 seconds.  The scope was withdrawn and the procedure completed. COMPLICATIONS: There were no complications.  ENDOSCOPIC IMPRESSION: 1.   Sessile polyp measuring 2 mm in size was found at the cecum; polypectomy was performed with cold forceps 2.   Sessile polyp measuring 3 mm in size was found in the descending colon; polypectomy was performed with a cold snare 3.   There was severe diverticulosis noted in the sigmoid colon 4.   There was moderate diverticulosis noted in the descending colon, transverse colon, and ascending colon 5.   Internal hemorrhoids  Hemoccult-positive stool could be due to hemorrhoids or a more proximal colonic bleeding source.  Diminutive polyps were not bleeding.  RECOMMENDATIONS: 1.  If the polyp(s) removed today are proven to be adenomatous (pre-cancerous) polyps, you will need a repeat colonoscopy in 5 years.  Otherwise you should continue to follow colorectal cancer screening guidelines for "routine risk" patients with colonoscopy in 10 years.  You will receive a letter within 1-2 weeks with the results of your biopsy as well as final recommendations.  Please call my office if you have not received a letter after 3 weeks. 2.  followup Hemoccults in 2 weeks 3.  office visit in one month   eSigned:  Inda Castle, MD 01/28/2014 4:23 PM   cc:   PATIENT NAME:  [name], [name] MR#: [id number]

## 2014-01-28 NOTE — Patient Instructions (Signed)
YOU HAD AN ENDOSCOPIC PROCEDURE TODAY AT THE [facility]: Refer to the procedure report that was given to you for any specific questions about what was found during the examination.  If the procedure report does not answer your questions, please call your gastroenterologist to clarify.  If you requested that your care partner not be given the details of your procedure findings, then the procedure report has been included in a sealed envelope for you to review at your convenience later.  YOU SHOULD EXPECT: Some feelings of bloating in the abdomen. Passage of more gas than usual.  Walking can help get rid of the air that was put into your GI tract during the procedure and reduce the bloating. If you had a lower endoscopy (such as a colonoscopy or flexible sigmoidoscopy) you may notice spotting of blood in your stool or on the toilet paper. If you underwent a bowel prep for your procedure, then you may not have a normal bowel movement for a few days.  DIET: Your first meal following the procedure should be a light meal and then it is ok to progress to your normal diet.  A half-sandwich or bowl of soup is an example of a good first meal.  Heavy or fried foods are harder to digest and may make you feel nauseous or bloated.  Likewise meals heavy in dairy and vegetables can cause extra gas to form and this can also increase the bloating.  Drink plenty of fluids but you should avoid alcoholic beverages for 24 hours.  ACTIVITY: Your care partner should take you home directly after the procedure.  You should plan to take it easy, moving slowly for the rest of the day.  You can resume normal activity the day after the procedure however you should NOT DRIVE or use heavy machinery for 24 hours (because of the sedation medicines used during the test).    SYMPTOMS TO REPORT IMMEDIATELY: A gastroenterologist can be reached at any hour.  During normal business hours, 8:30 AM to 5:00 PM Monday through Friday,  call [phone].  After hours and on weekends, please call the GI answering service at [phone] who will take a message and have the physician on call contact you.   Following lower endoscopy (colonoscopy or flexible sigmoidoscopy):  Excessive amounts of blood in the stool  Significant tenderness or worsening of abdominal pains  Swelling of the abdomen that is new, acute  Fever of 100F or higher  FOLLOW UP: If any biopsies were taken you will be contacted by phone or by letter within the next 1-3 weeks.  Call your gastroenterologist if you have not heard about the biopsies in 3 weeks.  Our staff will call the home number listed on your records the next business day following your procedure to check on you and address any questions or concerns that you may have at that time regarding the information given to you following your procedure. This is a courtesy call and so if there is no answer at the home number and we have not heard from you through the emergency physician on call, we will assume that you have returned to your regular daily activities without incident.  SIGNATURES/CONFIDENTIALITY: You and/or your care partner have signed paperwork which will be entered into your electronic medical record.  These signatures attest to the fact that that the information above on your After Visit Summary has been reviewed and is understood.  Full responsibility of the confidentiality of this   discharge information lies with you and/or your care-partner.  Recommendations See procedure report  

## 2014-01-28 NOTE — Progress Notes (Signed)
BP rechecked 150/100 at 1533.

## 2014-01-29 ENCOUNTER — Telehealth: Payer: Self-pay | Admitting: *Deleted

## 2014-01-29 NOTE — Telephone Encounter (Signed)
  Follow up Call-  Call back number 01/28/2014  Post procedure Call Back phone  # [phone]  Permission to leave phone message Yes     Patient questions:  Do you have a fever, pain , or abdominal swelling? no Pain Score  0 *  Have you tolerated food without any problems? yes  Have you been able to return to your normal activities? yes  Do you have any questions about your discharge instructions: Diet   no Medications  no Follow up visit  no  Do you have questions or concerns about your Care? no  Actions: * If pain score is 4 or above: No action needed, pain <4.

## 2014-01-30 ENCOUNTER — Telehealth: Payer: Self-pay | Admitting: *Deleted

## 2014-01-30 ENCOUNTER — Other Ambulatory Visit: Payer: Self-pay

## 2014-01-30 ENCOUNTER — Encounter: Payer: Self-pay | Admitting: Podiatrist

## 2014-01-30 ENCOUNTER — Ambulatory Visit (INDEPENDENT_AMBULATORY_CARE_PROVIDER_SITE_OTHER): Payer: Medicare Other | Admitting: Podiatrist

## 2014-01-30 VITALS — BP 136/65 | HR 77 | Resp 12

## 2014-01-30 DIAGNOSIS — R195 Other fecal abnormalities: Secondary | ICD-10-CM

## 2014-01-30 DIAGNOSIS — B351 Tinea unguium: Secondary | ICD-10-CM

## 2014-01-30 MED ORDER — EFINACONAZOLE 10 % EX SOLN: 1.0000 [drp] | Freq: Every day | CUTANEOUS | Status: DC

## 2014-01-30 NOTE — Telephone Encounter (Signed)
Left 5th toenail fragment sent to [facility] for definitive diagnosis of fungal element.

## 2014-01-30 NOTE — Patient Instructions (Signed)
Your prescription has been sent to [facility] as it is the most affordable place to fill your prescription for this particular medication.  Please give them a call at [phone] to confirm insurance benefits and to ensure correct delivery information.  If they have not heard from you, they will contact you to be sure you want them to fill the medication for you. Calling them first greatly speeds up the time it will take for you to receive your medication so it can begin working for you!    Their phone number is [phone].  Their pharmacy hours are Mon-Thurs 8-7; Fri 8-6pm.      

## 2014-01-30 NOTE — Progress Notes (Signed)
   Subjective:    Patient ID: [name] [name], female    DOB: 02-13-1946, 68 y.o.   MRN: [id number]  HPI    Review of Systems  HENT: Postnasal drip: '' LT FOOT 5TH TOE IS THICK HAND HAVE FUNGUS AND TREATMENT TRIED CICLOPIROX.''   All other systems reviewed and are negative.       Objective:   Physical Exam GENERAL APPEARANCE: Alert, conversant. Appropriately groomed. No acute distress.  VASCULAR: Pedal pulses palpable at 2/4 DP and PT bilateral.  Capillary refill time is immediate to all digits,  Proximal to distal cooling it warm to warm.  Digital hair growth is present bilateral  NEUROLOGIC: sensation is intact epicritically and protectively to 5.07 monofilament at 5/5 sites bilateral.  Light touch is intact bilateral, vibratory sensation intact bilateral, achilles tendon reflex is intact bilateral.  MUSCULOSKELETAL: acceptable muscle strength, tone and stability bilateral.  Intrinsic muscluature intact bilateral.  Rectus appearance of foot and digits noted bilateral.   DERMATOLOGIC: Dryness and flaking is located on the lateral aspect of the left foot. It appears irritated with a red base. No pustules are present. No sign of infection is noted. The left fifth digital nail also is thickened appears that there is fungus possible fungus versus dystrophy second toe right . She has tried ciclopirox with no relief in symptoms. She's also tried soaking and Listerine and states that she's noticed improvement with this. She has used the Solaraze gel for the porokeratotic lesions and this has worked [employer]. She has not used the urea cream for anything at.  \    Assessment & Plan:  Mycotic nail left fifth digit and possible right second digit, dermatitis, xerosis, porokeratosis. Plan: Recommended use of urea cream on the area of dry skin. If no improvement in symptoms can consider Elidel 1% cream.  Also called in [place] for her to try as opposed to the ciclopirox. She'll be seen back as  needed for followup and will call if these symptoms do not resolve with the prescribe medications.

## 2014-02-03 ENCOUNTER — Encounter: Payer: Self-pay | Admitting: Gastroenterology

## 2014-02-04 ENCOUNTER — Encounter: Payer: Self-pay | Admitting: *Deleted

## 2014-02-07 ENCOUNTER — Telehealth: Payer: Self-pay | Admitting: Gastroenterology

## 2014-02-07 NOTE — Telephone Encounter (Signed)
Discussed stool cards with pt and questions answered.

## 2014-02-13 ENCOUNTER — Telehealth: Payer: Self-pay | Admitting: Gastroenterology

## 2014-02-13 NOTE — Telephone Encounter (Signed)
Pt states she messed up doing her stool cards and she is going out of town next week. Wants to know if it will be ok for her to complete the cards after her trip. Discussed with pt that it will be fine for her to wait until her trip to [place]. Pt also wanted to know some names of drugs for IBS, she wants to check with her insurance company to see if they will cover any of them.  Pt called back and states that her insurance company will pay for dicyclomine and she would like to try this. Pt would like a script for this. Please advise.

## 2014-02-14 MED ORDER — DICYCLOMINE HCL 10 MG PO CAPS: ORAL_CAPSULE | ORAL | Status: DC

## 2014-02-14 NOTE — Telephone Encounter (Signed)
Okay to use dicyclomine 10 mg 4 times a day when necessary #30

## 2014-02-14 NOTE — Telephone Encounter (Signed)
Pt aware and script sent to the pharmacy. 

## 2014-02-17 ENCOUNTER — Telehealth: Payer: Self-pay | Admitting: *Deleted

## 2014-02-17 NOTE — Telephone Encounter (Signed)
Patient called regarding results of a nail specimen she sent out for a few weeks ago.

## 2014-02-17 NOTE — Telephone Encounter (Signed)
Called patient. Nail specimen was sent to [facility] lab for a definitive diagnosis. I advised the pt that this could take 4-6 weeks to obtain results. Patient states that she would like to wait for results before starting any treatment to the toenail. Therefore, she will hold off on using Jubila at this time. Informed we would call her with results and possible treatment options.

## 2014-02-21 ENCOUNTER — Telehealth: Payer: Self-pay | Admitting: *Deleted

## 2014-02-21 NOTE — Telephone Encounter (Signed)
DR Valentina Lucks states preliminary result of fungal culture are negative, and may mail copy of results to pt.  Mailed to pt's home address.

## 2014-03-04 ENCOUNTER — Ambulatory Visit (INDEPENDENT_AMBULATORY_CARE_PROVIDER_SITE_OTHER): Payer: No Typology Code available for payment source | Admitting: Gastroenterology

## 2014-03-04 ENCOUNTER — Encounter: Payer: Self-pay | Admitting: Gastroenterology

## 2014-03-04 VITALS — BP 118/78 | HR 80 | Ht 65.5 in | Wt 226.8 lb

## 2014-03-04 DIAGNOSIS — D126 Benign neoplasm of colon, unspecified: Secondary | ICD-10-CM

## 2014-03-04 DIAGNOSIS — K589 Irritable bowel syndrome without diarrhea: Secondary | ICD-10-CM | POA: Insufficient documentation

## 2014-03-04 DIAGNOSIS — K648 Other hemorrhoids: Secondary | ICD-10-CM

## 2014-03-04 DIAGNOSIS — R195 Other fecal abnormalities: Secondary | ICD-10-CM

## 2014-03-04 MED ORDER — GLYCOPYRROLATE 2 MG PO TABS: ORAL_TABLET | ORAL | Status: DC

## 2014-03-04 NOTE — Assessment & Plan Note (Signed)
Patient has been symptomatic in the past with rectal bleeding.  This is an intermittent problem.  Since symptoms are minimal weill not pursue treatment any further at this time.

## 2014-03-04 NOTE — Progress Notes (Signed)
          History of Present Illness:  [name] [name] has returned following colonoscopy where small adenomatous polyps were removed.  Diverticulosis and internal hemorrhoids were seen as well.  Followup Hemoccults cards are pending.  She had been taking aspirin and ibuprofen but has held this for the past month.  Her main complaint is frequent abdominal cramps with loose stools.  She remains highly anxious in anticipation of having GI problems when she leaves the home.  She tends to run anxious as well.  Librax has helped her in the past.    Review of Systems: Pertinent positive and negative review of systems were noted in the above HPI section. All other review of systems were otherwise negative.    Current Medications, Allergies, Past Medical History, Past Surgical History, Family History and Social History were reviewed in [facility] record  Vital signs were reviewed in today's medical record. Physical Exam: General: Well developed , well nourished, no acute distress  See Assessment and Plan under Problem List

## 2014-03-04 NOTE — Assessment & Plan Note (Signed)
This may have been do to hemorrhoidal bleeding or perhaps related to NSAID use.  Recommendations #1 followup Hemoccults.  Patient has been off ibuprofen for over one month

## 2014-03-04 NOTE — Assessment & Plan Note (Signed)
Plan followup colonoscopy 2020

## 2014-03-04 NOTE — Assessment & Plan Note (Signed)
Patient has diarrhea predominant IBS.  Symptoms are likely exacerbated by anxiety.  We discussed anxiolytic  Medication as well as anticholinergics.  She wishes to try the latter first.  Recon  Recommendations #1 trial of Robinul forte; if not improved I would consider an SSRI

## 2014-03-04 NOTE — Patient Instructions (Signed)
Go to the basement for your IFOB test Follow up in 6 weeks

## 2014-03-06 ENCOUNTER — Telehealth: Payer: Self-pay | Admitting: *Deleted

## 2014-03-06 NOTE — Telephone Encounter (Addendum)
Pt states received a letter she needs explained.  I sent fungal result to pt as directed by Dr. Valentina Lucks.  I called [phone] and the phone was busy at 216pm.  I called [phone] left a message informing pt the copy of the fungal results was for her records.

## 2014-03-09 DIAGNOSIS — N390 Urinary tract infection, site not specified: Secondary | ICD-10-CM

## 2014-03-09 HISTORY — DX: Urinary tract infection, site not specified: N39.0

## 2014-03-10 ENCOUNTER — Other Ambulatory Visit (INDEPENDENT_AMBULATORY_CARE_PROVIDER_SITE_OTHER): Payer: No Typology Code available for payment source

## 2014-03-10 DIAGNOSIS — R195 Other fecal abnormalities: Secondary | ICD-10-CM

## 2014-03-10 LAB — FECAL OCCULT BLOOD, IMMUNOCHEMICAL: FECAL OCCULT BLD: POSITIVE — AB

## 2014-03-11 ENCOUNTER — Other Ambulatory Visit: Payer: Self-pay

## 2014-03-11 DIAGNOSIS — R195 Other fecal abnormalities: Secondary | ICD-10-CM

## 2014-03-13 ENCOUNTER — Other Ambulatory Visit (INDEPENDENT_AMBULATORY_CARE_PROVIDER_SITE_OTHER): Payer: No Typology Code available for payment source

## 2014-03-13 ENCOUNTER — Ambulatory Visit (AMBULATORY_SURGERY_CENTER): Payer: No Typology Code available for payment source

## 2014-03-13 VITALS — Ht 65.0 in | Wt 230.0 lb

## 2014-03-13 DIAGNOSIS — K219 Gastro-esophageal reflux disease without esophagitis: Secondary | ICD-10-CM

## 2014-03-13 DIAGNOSIS — R195 Other fecal abnormalities: Secondary | ICD-10-CM

## 2014-03-13 LAB — CBC WITH DIFFERENTIAL/PLATELET
Basophils Absolute: 0 10*3/uL (ref 0.0–0.1)
Basophils Relative: 0.5 % (ref 0.0–3.0)
EOS PCT: 1.4 % (ref 0.0–5.0)
Eosinophils Absolute: 0.1 10*3/uL (ref 0.0–0.7)
HCT: 41.3 % (ref 36.0–46.0)
Hemoglobin: 13.7 g/dL (ref 12.0–15.0)
LYMPHS PCT: 26.7 % (ref 12.0–46.0)
Lymphs Abs: 2.2 10*3/uL (ref 0.7–4.0)
MCHC: 33.3 g/dL (ref 30.0–36.0)
MCV: 86.1 fl (ref 78.0–100.0)
MONO ABS: 0.7 10*3/uL (ref 0.1–1.0)
Monocytes Relative: 8.1 % (ref 3.0–12.0)
NEUTROS PCT: 63.3 % (ref 43.0–77.0)
Neutro Abs: 5.3 10*3/uL (ref 1.4–7.7)
PLATELETS: 261 10*3/uL (ref 150.0–400.0)
RBC: 4.8 Mil/uL (ref 3.87–5.11)
RDW: 14.7 % — ABNORMAL HIGH (ref 11.5–14.6)
WBC: 8.3 10*3/uL (ref 4.5–10.5)

## 2014-03-14 ENCOUNTER — Encounter: Payer: Self-pay | Admitting: Gastroenterology

## 2014-03-24 ENCOUNTER — Telehealth: Payer: Self-pay | Admitting: Gastroenterology

## 2014-03-24 NOTE — Telephone Encounter (Signed)
Pt calling for lab results. Please advise. 

## 2014-03-24 NOTE — Telephone Encounter (Signed)
Spoke with pt and she is aware.

## 2014-03-24 NOTE — Telephone Encounter (Signed)
Blood counts are normal.

## 2014-03-25 ENCOUNTER — Ambulatory Visit (AMBULATORY_SURGERY_CENTER): Payer: No Typology Code available for payment source | Admitting: Gastroenterology

## 2014-03-25 ENCOUNTER — Encounter: Payer: Self-pay | Admitting: Gastroenterology

## 2014-03-25 VITALS — BP 134/77 | HR 77 | Temp 97.3°F | Resp 18 | Ht 65.0 in | Wt 230.0 lb

## 2014-03-25 DIAGNOSIS — D131 Benign neoplasm of stomach: Secondary | ICD-10-CM

## 2014-03-25 DIAGNOSIS — R195 Other fecal abnormalities: Secondary | ICD-10-CM

## 2014-03-25 DIAGNOSIS — K219 Gastro-esophageal reflux disease without esophagitis: Secondary | ICD-10-CM

## 2014-03-25 MED ORDER — SODIUM CHLORIDE 0.9 % IV SOLN: 500.0000 mL | INTRAVENOUS | Status: DC

## 2014-03-25 NOTE — Progress Notes (Signed)
Report to pacu rn, vss, bbs=clear 

## 2014-03-25 NOTE — Op Note (Addendum)
[facility]  [address]. [place], [address]   ENDOSCOPY PROCEDURE REPORT  PATIENT: [name] [name], [name] [name]  MR#: [id number] BIRTHDATE: [birth year] , 22  yrs. old GENDER: Female ENDOSCOPIST: Inda Castle, MD REFERRED BY: PROCEDURE DATE:  03/25/2014 PROCEDURE:  EGD, diagnostic ASA CLASS:     Class II INDICATIONS:  Occult blood positive. MEDICATIONS: MAC sedation, administered by CRNA, propofol (Diprivan) 150mg  IV, and Simethicone 0.6cc PO TOPICAL ANESTHETIC: Cetacaine Spray  DESCRIPTION OF PROCEDURE: After the risks benefits and alternatives of the procedure were thoroughly explained, informed consent was obtained.  The LB [id number] endoscope was introduced through the mouth and advanced to the third portion of the duodenum. Without limitations.  The instrument was slowly withdrawn as the mucosa was fully examined.      The upper, middle and distal third of the esophagus were carefully inspected and no abnormalities were noted.  The z-line was well seen at the GEJ.  The endoscope was pushed into the fundus which was normal including a retroflexed view.  The antrum, gastric body, first and second part of the duodenum were unremarkable. There are multiple fundic appearing polyps in the gastric fundus.  There were not bleeding. Retroflexed views revealed no abnormalities.     The scope was then withdrawn from the patient and the procedure completed.  COMPLICATIONS: There were no complications. ENDOSCOPIC IMPRESSION: fundic gland polyps  RECOMMENDATIONS: no further GI workup.  Heme positive stool probably secondary to hemorrhoids.  Hemoglobin is normal. REPEAT EXAM:  eSigned:  Inda Castle, MD 03/25/2014 10:29 AM Revised: 03/25/2014 10:29 AM  CC:

## 2014-03-25 NOTE — Patient Instructions (Signed)

## 2014-03-25 NOTE — Progress Notes (Signed)
No complaints noted in the recovery room. Maw   

## 2014-03-26 ENCOUNTER — Telehealth: Payer: Self-pay | Admitting: *Deleted

## 2014-03-26 NOTE — Telephone Encounter (Signed)
Per Dr. Valentina Lucks, I called and informed her that her culture came back negative for fungus.  However, Dr. Valentina Lucks wants her to continue to use the Jublia to see if there's any improvement.  She stated she hasn't used it yet but she will start using it.  She will call if it doesn't improve any.

## 2014-03-26 NOTE — Telephone Encounter (Signed)
  Follow up Call-  Call back number 03/25/2014 01/28/2014  Post procedure Call Back phone  # [phone]  Permission to leave phone message Yes Yes     Patient questions:  Do you have a fever, pain , or abdominal swelling? no Pain Score  0 *  Have you tolerated food without any problems? yes  Have you been able to return to your normal activities? yes  Do you have any questions about your discharge instructions: Diet   no Medications  no Follow up visit  no  Do you have questions or concerns about your Care? yes  Actions: * If pain score is 4 or above: No action needed, pain <4.

## 2014-04-08 ENCOUNTER — Encounter: Payer: Self-pay | Admitting: Podiatrist

## 2014-05-30 ENCOUNTER — Emergency Department (HOSPITAL_COMMUNITY)
Admission: EM | Admit: 2014-05-30 | Discharge: 2014-05-30 | Disposition: A | Discharge status: Home / Self Care | Payer: No Typology Code available for payment source | Attending: Emergency Medicine | Admitting: Emergency Medicine

## 2014-05-30 ENCOUNTER — Encounter (HOSPITAL_COMMUNITY): Payer: Self-pay | Admitting: Emergency Medicine

## 2014-05-30 ENCOUNTER — Emergency Department (HOSPITAL_COMMUNITY): Payer: No Typology Code available for payment source

## 2014-05-30 DIAGNOSIS — Z87891 Personal history of nicotine dependence: Secondary | ICD-10-CM | POA: Insufficient documentation

## 2014-05-30 DIAGNOSIS — K219 Gastro-esophageal reflux disease without esophagitis: Secondary | ICD-10-CM | POA: Insufficient documentation

## 2014-05-30 DIAGNOSIS — Y92410 Unspecified street and highway as the place of occurrence of the external cause: Secondary | ICD-10-CM | POA: Diagnosis not present

## 2014-05-30 DIAGNOSIS — Z87442 Personal history of urinary calculi: Secondary | ICD-10-CM | POA: Diagnosis not present

## 2014-05-30 DIAGNOSIS — Z7982 Long term (current) use of aspirin: Secondary | ICD-10-CM | POA: Diagnosis not present

## 2014-05-30 DIAGNOSIS — E785 Hyperlipidemia, unspecified: Secondary | ICD-10-CM | POA: Insufficient documentation

## 2014-05-30 DIAGNOSIS — Z880 Allergy status to penicillin: Secondary | ICD-10-CM | POA: Diagnosis not present

## 2014-05-30 DIAGNOSIS — I10 Essential (primary) hypertension: Secondary | ICD-10-CM | POA: Diagnosis not present

## 2014-05-30 DIAGNOSIS — E079 Disorder of thyroid, unspecified: Secondary | ICD-10-CM | POA: Diagnosis not present

## 2014-05-30 DIAGNOSIS — R0789 Other chest pain: Secondary | ICD-10-CM

## 2014-05-30 DIAGNOSIS — S298XXA Other specified injuries of thorax, initial encounter: Secondary | ICD-10-CM | POA: Diagnosis present

## 2014-05-30 DIAGNOSIS — S139XXA Sprain of joints and ligaments of unspecified parts of neck, initial encounter: Secondary | ICD-10-CM | POA: Insufficient documentation

## 2014-05-30 DIAGNOSIS — Z87440 Personal history of urinary (tract) infections: Secondary | ICD-10-CM | POA: Insufficient documentation

## 2014-05-30 DIAGNOSIS — S8000XA Contusion of unspecified knee, initial encounter: Secondary | ICD-10-CM | POA: Diagnosis not present

## 2014-05-30 DIAGNOSIS — Y9389 Activity, other specified: Secondary | ICD-10-CM | POA: Diagnosis not present

## 2014-05-30 DIAGNOSIS — Z79899 Other long term (current) drug therapy: Secondary | ICD-10-CM | POA: Diagnosis not present

## 2014-05-30 DIAGNOSIS — S161XXA Strain of muscle, fascia and tendon at neck level, initial encounter: Secondary | ICD-10-CM

## 2014-05-30 LAB — I-STAT TROPONIN, ED: Troponin i, poc: 0.01 ng/mL (ref 0.00–0.08)

## 2014-05-30 LAB — BASIC METABOLIC PANEL
BUN: 15 mg/dL (ref 6–23)
CHLORIDE: 99 meq/L (ref 96–112)
CO2: 27 mEq/L (ref 19–32)
CREATININE: 0.7 mg/dL (ref 0.50–1.10)
Calcium: 10 mg/dL (ref 8.4–10.5)
GFR calc non Af Amer: 86 mL/min — ABNORMAL LOW (ref >90)
Glucose, Bld: 103 mg/dL — ABNORMAL HIGH (ref 70–99)
Potassium: 3.8 mEq/L (ref 3.7–5.3)
Sodium: 141 mEq/L (ref 137–147)

## 2014-05-30 LAB — APTT: APTT: 28 s (ref 24–37)

## 2014-05-30 LAB — CBC
HCT: 43.8 % (ref 36.0–46.0)
Hemoglobin: 14.5 g/dL (ref 12.0–15.0)
MCH: 29 pg (ref 26.0–34.0)
MCHC: 33.1 g/dL (ref 30.0–36.0)
MCV: 87.6 fL (ref 78.0–100.0)
PLATELETS: 224 10*3/uL (ref 150–400)
RBC: 5 MIL/uL (ref 3.87–5.11)
RDW: 14.5 % (ref 11.5–15.5)
WBC: 11.3 10*3/uL — AB (ref 4.0–10.5)

## 2014-05-30 LAB — PROTIME-INR
INR: 0.92 (ref 0.00–1.49)
Prothrombin Time: 12.2 seconds (ref 11.6–15.2)

## 2014-05-30 MED ORDER — CYCLOBENZAPRINE HCL 5 MG PO TABS: 5.0000 mg | ORAL_TABLET | Freq: Three times a day (TID) | ORAL | Status: DC | PRN

## 2014-05-30 MED ORDER — OXYCODONE-ACETAMINOPHEN 5-325 MG PO TABS
1.0000 | ORAL_TABLET | Freq: Four times a day (QID) | ORAL | Status: DC | PRN
Start: 2014-05-30 — End: 2017-01-03

## 2014-05-30 MED ORDER — OXYCODONE-ACETAMINOPHEN 5-325 MG PO TABS
1.0000 | ORAL_TABLET | Freq: Once | ORAL | Status: AC
  Administered 2014-05-30: 1 via ORAL
  Filled 2014-05-30 (×2): qty 1

## 2014-05-30 NOTE — Discharge Instructions (Signed)
Return to the ED with any concerns including increased pain, difficulty breathing, weakness in arms or legs, abdominal pain, decreased level of alertness/lethargy, or any other alarming symptoms  The xrays did not show any fractures.  It can happen that the xray does not show subtle fractures initially.  If pain continues you should be re-evaluated by your doctor and perhaps have xrays repeated

## 2014-05-30 NOTE — ED Notes (Addendum)
Patient returned from [place]. Chastity at the bedside placing patient back on the monitor.
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
## 2014-08-06 ENCOUNTER — Encounter: Payer: Self-pay | Admitting: Gastroenterology

## 2015-02-03 ENCOUNTER — Ambulatory Visit: Payer: No Typology Code available for payment source | Admitting: Cardiovascular Disease

## 2015-03-17 ENCOUNTER — Ambulatory Visit (INDEPENDENT_AMBULATORY_CARE_PROVIDER_SITE_OTHER): Payer: No Typology Code available for payment source | Admitting: Cardiovascular Disease

## 2015-03-17 ENCOUNTER — Encounter: Payer: Self-pay | Admitting: Cardiovascular Disease

## 2015-03-17 VITALS — BP 148/86 | HR 83 | Ht 65.0 in | Wt 234.0 lb

## 2015-03-17 DIAGNOSIS — I10 Essential (primary) hypertension: Secondary | ICD-10-CM

## 2015-03-17 DIAGNOSIS — E7800 Pure hypercholesterolemia, unspecified: Secondary | ICD-10-CM

## 2015-03-17 MED ORDER — LOSARTAN POTASSIUM-HCTZ 100-25 MG PO TABS: 1.0000 | ORAL_TABLET | Freq: Every day | ORAL | Status: DC

## 2015-03-17 NOTE — Assessment & Plan Note (Signed)
History of hypertension blood pressure measured at 148/86. She is on amlodipine and Benicar/hydrochlorothiazide. Because of her current insurance company she wishes to change to a generic ARB. We will switch her to losartan.

## 2015-03-17 NOTE — Assessment & Plan Note (Signed)
History of hyperlipidemia on intermittent Crestor because of statin intolerance with recent lipid profile performed 01/10/15 revealing total cholesterol 2:30, LDL of 86, HDL of 41 and triglyceride level of 222.

## 2015-03-17 NOTE — Patient Instructions (Signed)
Your physician wants you to follow-up in: 1 year with Dr Gwenlyn Found. You will receive a reminder letter in the mail two months in advance. If you don't receive a letter, please call our office to schedule the follow-up appointment.  Losartan Hct replaced the Benicar Hct.  I have sent in the prescription for you.

## 2015-03-17 NOTE — Progress Notes (Signed)
03/17/2015 [name] [name]   1946/06/28  [id number]  Primary Physician [name] Calamity, MD Primary Cardiologist: [name] Harp MD [name] [name]   HPI:  [name] [name] is a 69 year old mildly overweight married Caucasian female para 3, her mother to 6 grandchildren accompanied by her husband dentist today. She is self-referred to be established in our practice, a friend of hers is normal patients. She is retired from [name] in [place] and moved to [place] 17 years ago here at her primary care physician is Dr. Clyda [name]. Her cardiovascular risk factor profile is notable for family history of heart disease with both parents died of myocardial infarctions in their 18s and 76s and a brother who died of an MI at 74. She has never had a heart attack or a clinical stroke. Risk factors otherwise remarkable for treated hypertension and hyperlipidemia. She does have GERD. She's had atypical jaw and chest pain for years occurring at rest every several months. She had negative nuclear stress test and 2-D echocardiogram at [facility] 2 years ago.   Current Outpatient Prescriptions  Medication Sig Dispense Refill  . allopurinol (ZYLOPRIM) 100 MG tablet Take 100 mg by mouth daily.    Marland Kitchen amLODipine (NORVASC) 10 MG tablet Take 5 mg by mouth daily.    Marland Kitchen aspirin 81 MG tablet Take 81 mg by mouth daily.    . Cholecalciferol (VITAMIN D) 2000 UNITS CAPS Take 2,000 Units by mouth.    . cyclobenzaprine (FLEXERIL) 5 MG tablet Take 1 tablet (5 mg total) by mouth 3 (three) times daily as needed for muscle spasms. 20 tablet 0  . dicyclomine (BENTYL) 10 MG capsule Take 10 mg by mouth daily. Take 1 tablet by mouth 4 times a day as needed    . lansoprazole (PREVACID) 15 MG capsule Take 15 mg by mouth daily.    Marland Kitchen levothyroxine (SYNTHROID, LEVOTHROID) 125 MCG tablet Take 125 mcg by mouth daily before breakfast.    . oxyCODONE-acetaminophen  (PERCOCET/ROXICET) 5-325 MG per tablet Take 1-2 tablets by mouth every 6 (six) hours as needed for severe pain. 15 tablet 0  . rosuvastatin (CRESTOR) 5 MG tablet Take 5 mg by mouth every other day.     . traMADol (ULTRAM-ER) 100 MG 24 hr tablet Take 100 mg by mouth daily.    Marland Kitchen losartan-hydrochlorothiazide (HYZAAR) 100-25 MG per tablet Take 1 tablet by mouth daily. 30 tablet 11   No current facility-administered medications for this visit.    Allergies  Allergen Reactions  . Amoxicillin-Pot Clavulanate   . Clarithromycin   . Amoxicillin Rash  . Ampicillin Rash  . Clopidogrel Bisulfate Rash  . Zestril [Lisinopril] Rash    History   Social History  . Marital Status: Married    Spouse Name: N/A  . Number of Children: N/A  . Years of Education: N/A   Occupational History  . Not on file.   Social History Main Topics  . Smoking status: Former Research scientist (life sciences)  . Smokeless tobacco: Never Used  . Alcohol Use: No  . Drug Use: No  . Sexual Activity: Not on file   Other Topics Concern  . Not on file   Social History Narrative     Review of Systems: General: negative for chills, fever, night sweats or weight changes.  Cardiovascular: negative for chest pain, dyspnea on exertion, edema, orthopnea, palpitations, paroxysmal nocturnal dyspnea or shortness of breath Dermatological: negative for rash Respiratory: negative for cough or  wheezing Urologic: negative for hematuria Abdominal: negative for nausea, vomiting, diarrhea, bright red blood per rectum, melena, or hematemesis Neurologic: negative for visual changes, syncope, or dizziness All other systems reviewed and are otherwise negative except as noted above.    Blood pressure 148/86, pulse 83, height 5\' 5"  (1.651 m), weight 234 lb (106.142 kg).  General appearance: alert and no distress Neck: no adenopathy, no carotid bruit, no JVD, supple, symmetrical, trachea midline and thyroid not enlarged, symmetric, no  tenderness/mass/nodules Lungs: clear to auscultation bilaterally Heart: soft outflow tract murmur Extremities: intact pedal pulses  EKG normal sinus rhythm 83 without ST or T-wave changes. I personally reviewed this EKG  ASSESSMENT AND PLAN:   HYPERCHOLESTEROLEMIA History of hyperlipidemia on intermittent Crestor because of statin intolerance with recent lipid profile performed 01/10/15 revealing total cholesterol 2:30, LDL of 86, HDL of 41 and triglyceride level of 222.   Essential hypertension History of hypertension blood pressure measured at 148/86. She is on amlodipine and Benicar/hydrochlorothiazide. Because of her current insurance company she wishes to change to a generic ARB. We will switch her to losartan.       [name] Harp MD FACP,FACC,FAHA, [facility] 03/17/2015 2:06 PM

## 2015-05-06 ENCOUNTER — Other Ambulatory Visit: Payer: Self-pay | Admitting: Obstetrics and Gynecology

## 2015-05-07 LAB — CYTOLOGY - PAP

## 2015-05-28 ENCOUNTER — Telehealth: Payer: Self-pay | Admitting: Gastroenterology

## 2015-05-29 NOTE — Telephone Encounter (Signed)
Discussed with the patient. She is comfortable with continuing to take her OTC Prevacid. She is taking it every other day. She will contact us if she decides she wants to change PPI.

## 2016-01-04 DIAGNOSIS — R7309 Other abnormal glucose: Secondary | ICD-10-CM | POA: Diagnosis not present

## 2016-01-04 DIAGNOSIS — R350 Frequency of micturition: Secondary | ICD-10-CM | POA: Diagnosis not present

## 2016-01-04 DIAGNOSIS — E039 Hypothyroidism, unspecified: Secondary | ICD-10-CM | POA: Diagnosis not present

## 2016-01-04 DIAGNOSIS — E785 Hyperlipidemia, unspecified: Secondary | ICD-10-CM | POA: Diagnosis not present

## 2016-01-04 DIAGNOSIS — G8929 Other chronic pain: Secondary | ICD-10-CM | POA: Diagnosis not present

## 2016-01-04 DIAGNOSIS — M25561 Pain in right knee: Secondary | ICD-10-CM | POA: Diagnosis not present

## 2016-01-04 DIAGNOSIS — I10 Essential (primary) hypertension: Secondary | ICD-10-CM | POA: Diagnosis not present

## 2016-01-04 DIAGNOSIS — M1A9XX0 Chronic gout, unspecified, without tophus (tophi): Secondary | ICD-10-CM | POA: Diagnosis not present

## 2016-01-06 DIAGNOSIS — M25562 Pain in left knee: Secondary | ICD-10-CM | POA: Diagnosis not present

## 2016-01-06 DIAGNOSIS — M170 Bilateral primary osteoarthritis of knee: Secondary | ICD-10-CM | POA: Diagnosis not present

## 2016-01-06 DIAGNOSIS — M1712 Unilateral primary osteoarthritis, left knee: Secondary | ICD-10-CM | POA: Diagnosis not present

## 2016-01-06 DIAGNOSIS — M1711 Unilateral primary osteoarthritis, right knee: Secondary | ICD-10-CM | POA: Diagnosis not present

## 2016-01-22 DIAGNOSIS — N390 Urinary tract infection, site not specified: Secondary | ICD-10-CM | POA: Diagnosis not present

## 2016-02-05 DIAGNOSIS — N323 Diverticulum of bladder: Secondary | ICD-10-CM | POA: Diagnosis not present

## 2016-02-05 DIAGNOSIS — Z0000 Encounter for general adult medical examination without abnormal findings: Secondary | ICD-10-CM | POA: Diagnosis not present

## 2016-02-05 DIAGNOSIS — K589 Irritable bowel syndrome without diarrhea: Secondary | ICD-10-CM | POA: Diagnosis not present

## 2016-02-05 DIAGNOSIS — N3020 Other chronic cystitis without hematuria: Secondary | ICD-10-CM | POA: Diagnosis not present

## 2016-02-05 DIAGNOSIS — R1011 Right upper quadrant pain: Secondary | ICD-10-CM | POA: Diagnosis not present

## 2016-02-10 DIAGNOSIS — N3020 Other chronic cystitis without hematuria: Secondary | ICD-10-CM | POA: Diagnosis not present

## 2016-02-12 DIAGNOSIS — M1711 Unilateral primary osteoarthritis, right knee: Secondary | ICD-10-CM | POA: Diagnosis not present

## 2016-02-19 DIAGNOSIS — H25813 Combined forms of age-related cataract, bilateral: Secondary | ICD-10-CM | POA: Diagnosis not present

## 2016-02-19 DIAGNOSIS — M1711 Unilateral primary osteoarthritis, right knee: Secondary | ICD-10-CM | POA: Diagnosis not present

## 2016-02-19 DIAGNOSIS — H04123 Dry eye syndrome of bilateral lacrimal glands: Secondary | ICD-10-CM | POA: Diagnosis not present

## 2016-02-19 DIAGNOSIS — E876 Hypokalemia: Secondary | ICD-10-CM | POA: Diagnosis not present

## 2016-02-19 DIAGNOSIS — H1851 Endothelial corneal dystrophy: Secondary | ICD-10-CM | POA: Diagnosis not present

## 2016-02-26 DIAGNOSIS — M1711 Unilateral primary osteoarthritis, right knee: Secondary | ICD-10-CM | POA: Diagnosis not present

## 2016-03-07 DIAGNOSIS — H25811 Combined forms of age-related cataract, right eye: Secondary | ICD-10-CM | POA: Diagnosis not present

## 2016-03-07 DIAGNOSIS — H2511 Age-related nuclear cataract, right eye: Secondary | ICD-10-CM | POA: Diagnosis not present

## 2016-03-15 DIAGNOSIS — E876 Hypokalemia: Secondary | ICD-10-CM | POA: Diagnosis not present

## 2016-03-16 DIAGNOSIS — D2271 Melanocytic nevi of right lower limb, including hip: Secondary | ICD-10-CM | POA: Diagnosis not present

## 2016-03-16 DIAGNOSIS — C44712 Basal cell carcinoma of skin of right lower limb, including hip: Secondary | ICD-10-CM | POA: Diagnosis not present

## 2016-03-16 DIAGNOSIS — Z23 Encounter for immunization: Secondary | ICD-10-CM | POA: Diagnosis not present

## 2016-03-16 DIAGNOSIS — Z85828 Personal history of other malignant neoplasm of skin: Secondary | ICD-10-CM | POA: Diagnosis not present

## 2016-03-16 DIAGNOSIS — Z808 Family history of malignant neoplasm of other organs or systems: Secondary | ICD-10-CM | POA: Diagnosis not present

## 2016-03-16 DIAGNOSIS — D485 Neoplasm of uncertain behavior of skin: Secondary | ICD-10-CM | POA: Diagnosis not present

## 2016-03-16 DIAGNOSIS — L821 Other seborrheic keratosis: Secondary | ICD-10-CM | POA: Diagnosis not present

## 2016-03-21 DIAGNOSIS — H2512 Age-related nuclear cataract, left eye: Secondary | ICD-10-CM | POA: Diagnosis not present

## 2016-03-21 DIAGNOSIS — H25812 Combined forms of age-related cataract, left eye: Secondary | ICD-10-CM | POA: Diagnosis not present

## 2016-04-15 DIAGNOSIS — M1711 Unilateral primary osteoarthritis, right knee: Secondary | ICD-10-CM | POA: Diagnosis not present

## 2016-04-26 DIAGNOSIS — L089 Local infection of the skin and subcutaneous tissue, unspecified: Secondary | ICD-10-CM | POA: Diagnosis not present

## 2016-04-26 DIAGNOSIS — C44712 Basal cell carcinoma of skin of right lower limb, including hip: Secondary | ICD-10-CM | POA: Diagnosis not present

## 2016-05-25 DIAGNOSIS — L821 Other seborrheic keratosis: Secondary | ICD-10-CM | POA: Diagnosis not present

## 2016-05-25 DIAGNOSIS — L570 Actinic keratosis: Secondary | ICD-10-CM | POA: Diagnosis not present

## 2016-06-20 DIAGNOSIS — R21 Rash and other nonspecific skin eruption: Secondary | ICD-10-CM | POA: Diagnosis not present

## 2016-06-20 DIAGNOSIS — W57XXXA Bitten or stung by nonvenomous insect and other nonvenomous arthropods, initial encounter: Secondary | ICD-10-CM | POA: Diagnosis not present

## 2016-06-30 DIAGNOSIS — D485 Neoplasm of uncertain behavior of skin: Secondary | ICD-10-CM | POA: Diagnosis not present

## 2016-06-30 DIAGNOSIS — L821 Other seborrheic keratosis: Secondary | ICD-10-CM | POA: Diagnosis not present

## 2016-06-30 DIAGNOSIS — B078 Other viral warts: Secondary | ICD-10-CM | POA: Diagnosis not present

## 2016-06-30 DIAGNOSIS — L110 Acquired keratosis follicularis: Secondary | ICD-10-CM | POA: Diagnosis not present

## 2016-07-11 DIAGNOSIS — S99921A Unspecified injury of right foot, initial encounter: Secondary | ICD-10-CM | POA: Diagnosis not present

## 2016-07-11 DIAGNOSIS — M62830 Muscle spasm of back: Secondary | ICD-10-CM | POA: Diagnosis not present

## 2016-07-11 DIAGNOSIS — M545 Low back pain: Secondary | ICD-10-CM | POA: Diagnosis not present

## 2016-07-11 DIAGNOSIS — G8929 Other chronic pain: Secondary | ICD-10-CM | POA: Diagnosis not present

## 2016-07-20 DIAGNOSIS — M1711 Unilateral primary osteoarthritis, right knee: Secondary | ICD-10-CM | POA: Diagnosis not present

## 2016-07-21 DIAGNOSIS — G8929 Other chronic pain: Secondary | ICD-10-CM | POA: Diagnosis not present

## 2016-07-21 DIAGNOSIS — S99921D Unspecified injury of right foot, subsequent encounter: Secondary | ICD-10-CM | POA: Diagnosis not present

## 2016-07-21 DIAGNOSIS — H6120 Impacted cerumen, unspecified ear: Secondary | ICD-10-CM | POA: Diagnosis not present

## 2016-07-21 DIAGNOSIS — M545 Low back pain: Secondary | ICD-10-CM | POA: Diagnosis not present

## 2016-07-26 DIAGNOSIS — N200 Calculus of kidney: Secondary | ICD-10-CM | POA: Diagnosis not present

## 2016-08-09 DIAGNOSIS — M1A9XX0 Chronic gout, unspecified, without tophus (tophi): Secondary | ICD-10-CM | POA: Diagnosis not present

## 2016-08-09 DIAGNOSIS — E559 Vitamin D deficiency, unspecified: Secondary | ICD-10-CM | POA: Diagnosis not present

## 2016-08-09 DIAGNOSIS — G8929 Other chronic pain: Secondary | ICD-10-CM | POA: Diagnosis not present

## 2016-08-09 DIAGNOSIS — E039 Hypothyroidism, unspecified: Secondary | ICD-10-CM | POA: Diagnosis not present

## 2016-08-09 DIAGNOSIS — I10 Essential (primary) hypertension: Secondary | ICD-10-CM | POA: Diagnosis not present

## 2016-08-09 DIAGNOSIS — M545 Low back pain: Secondary | ICD-10-CM | POA: Diagnosis not present

## 2016-08-12 DIAGNOSIS — M8588 Other specified disorders of bone density and structure, other site: Secondary | ICD-10-CM | POA: Diagnosis not present

## 2016-08-12 DIAGNOSIS — M7731 Calcaneal spur, right foot: Secondary | ICD-10-CM | POA: Diagnosis not present

## 2016-08-12 DIAGNOSIS — M2578 Osteophyte, vertebrae: Secondary | ICD-10-CM | POA: Diagnosis not present

## 2016-08-12 DIAGNOSIS — M2011 Hallux valgus (acquired), right foot: Secondary | ICD-10-CM | POA: Diagnosis not present

## 2016-08-12 DIAGNOSIS — M79671 Pain in right foot: Secondary | ICD-10-CM | POA: Diagnosis not present

## 2016-08-12 DIAGNOSIS — G8929 Other chronic pain: Secondary | ICD-10-CM | POA: Diagnosis not present

## 2016-08-12 DIAGNOSIS — M5136 Other intervertebral disc degeneration, lumbar region: Secondary | ICD-10-CM | POA: Diagnosis not present

## 2016-08-12 DIAGNOSIS — M545 Low back pain: Secondary | ICD-10-CM | POA: Diagnosis not present

## 2016-08-12 DIAGNOSIS — I700 Atherosclerosis of aorta: Secondary | ICD-10-CM | POA: Diagnosis not present

## 2016-08-16 DIAGNOSIS — L821 Other seborrheic keratosis: Secondary | ICD-10-CM | POA: Diagnosis not present

## 2016-08-16 DIAGNOSIS — L570 Actinic keratosis: Secondary | ICD-10-CM | POA: Diagnosis not present

## 2016-08-16 DIAGNOSIS — L818 Other specified disorders of pigmentation: Secondary | ICD-10-CM | POA: Diagnosis not present

## 2016-08-30 DIAGNOSIS — M545 Low back pain: Secondary | ICD-10-CM | POA: Diagnosis not present

## 2016-08-30 DIAGNOSIS — M25571 Pain in right ankle and joints of right foot: Secondary | ICD-10-CM | POA: Diagnosis not present

## 2016-09-06 DIAGNOSIS — D2271 Melanocytic nevi of right lower limb, including hip: Secondary | ICD-10-CM | POA: Diagnosis not present

## 2016-09-06 DIAGNOSIS — Z808 Family history of malignant neoplasm of other organs or systems: Secondary | ICD-10-CM | POA: Diagnosis not present

## 2016-09-06 DIAGNOSIS — L821 Other seborrheic keratosis: Secondary | ICD-10-CM | POA: Diagnosis not present

## 2016-09-06 DIAGNOSIS — S91311A Laceration without foreign body, right foot, initial encounter: Secondary | ICD-10-CM | POA: Diagnosis not present

## 2016-09-06 DIAGNOSIS — Z85828 Personal history of other malignant neoplasm of skin: Secondary | ICD-10-CM | POA: Diagnosis not present

## 2016-09-14 DIAGNOSIS — M1711 Unilateral primary osteoarthritis, right knee: Secondary | ICD-10-CM | POA: Diagnosis not present

## 2016-09-21 DIAGNOSIS — M1711 Unilateral primary osteoarthritis, right knee: Secondary | ICD-10-CM | POA: Diagnosis not present

## 2016-09-28 DIAGNOSIS — M1711 Unilateral primary osteoarthritis, right knee: Secondary | ICD-10-CM | POA: Diagnosis not present

## 2016-10-04 DIAGNOSIS — Z87440 Personal history of urinary (tract) infections: Secondary | ICD-10-CM | POA: Diagnosis not present

## 2016-10-04 DIAGNOSIS — R3915 Urgency of urination: Secondary | ICD-10-CM | POA: Diagnosis not present

## 2016-10-06 DIAGNOSIS — Z23 Encounter for immunization: Secondary | ICD-10-CM | POA: Diagnosis not present

## 2016-10-06 DIAGNOSIS — L821 Other seborrheic keratosis: Secondary | ICD-10-CM | POA: Diagnosis not present

## 2016-10-06 DIAGNOSIS — B079 Viral wart, unspecified: Secondary | ICD-10-CM | POA: Diagnosis not present

## 2016-11-03 DIAGNOSIS — N390 Urinary tract infection, site not specified: Secondary | ICD-10-CM | POA: Diagnosis not present

## 2016-11-03 DIAGNOSIS — N958 Other specified menopausal and perimenopausal disorders: Secondary | ICD-10-CM | POA: Diagnosis not present

## 2016-11-03 DIAGNOSIS — Z01419 Encounter for gynecological examination (general) (routine) without abnormal findings: Secondary | ICD-10-CM | POA: Diagnosis not present

## 2016-11-03 DIAGNOSIS — Z1231 Encounter for screening mammogram for malignant neoplasm of breast: Secondary | ICD-10-CM | POA: Diagnosis not present

## 2016-11-03 DIAGNOSIS — Z6837 Body mass index (BMI) 37.0-37.9, adult: Secondary | ICD-10-CM | POA: Diagnosis not present

## 2016-11-24 DIAGNOSIS — L821 Other seborrheic keratosis: Secondary | ICD-10-CM | POA: Diagnosis not present

## 2016-11-24 DIAGNOSIS — L570 Actinic keratosis: Secondary | ICD-10-CM | POA: Diagnosis not present

## 2016-11-30 DIAGNOSIS — N390 Urinary tract infection, site not specified: Secondary | ICD-10-CM | POA: Diagnosis not present

## 2016-11-30 DIAGNOSIS — R0602 Shortness of breath: Secondary | ICD-10-CM | POA: Diagnosis not present

## 2016-11-30 DIAGNOSIS — I10 Essential (primary) hypertension: Secondary | ICD-10-CM | POA: Diagnosis not present

## 2016-11-30 DIAGNOSIS — Z1231 Encounter for screening mammogram for malignant neoplasm of breast: Secondary | ICD-10-CM | POA: Diagnosis not present

## 2016-11-30 DIAGNOSIS — E785 Hyperlipidemia, unspecified: Secondary | ICD-10-CM | POA: Diagnosis not present

## 2016-11-30 DIAGNOSIS — Z13820 Encounter for screening for osteoporosis: Secondary | ICD-10-CM | POA: Diagnosis not present

## 2016-12-01 DIAGNOSIS — N390 Urinary tract infection, site not specified: Secondary | ICD-10-CM | POA: Diagnosis not present

## 2016-12-09 DIAGNOSIS — L03032 Cellulitis of left toe: Secondary | ICD-10-CM | POA: Diagnosis not present

## 2017-01-03 ENCOUNTER — Ambulatory Visit (INDEPENDENT_AMBULATORY_CARE_PROVIDER_SITE_OTHER): Payer: PPO | Admitting: Cardiovascular Disease

## 2017-01-03 ENCOUNTER — Encounter: Payer: Self-pay | Admitting: Cardiovascular Disease

## 2017-01-03 VITALS — BP 148/88 | HR 83 | Ht 65.0 in | Wt 228.6 lb

## 2017-01-03 DIAGNOSIS — I10 Essential (primary) hypertension: Secondary | ICD-10-CM | POA: Diagnosis not present

## 2017-01-03 DIAGNOSIS — R0989 Other specified symptoms and signs involving the circulatory and respiratory systems: Secondary | ICD-10-CM

## 2017-01-03 DIAGNOSIS — R011 Cardiac murmur, unspecified: Secondary | ICD-10-CM | POA: Diagnosis not present

## 2017-01-03 DIAGNOSIS — E7800 Pure hypercholesterolemia, unspecified: Secondary | ICD-10-CM | POA: Diagnosis not present

## 2017-01-03 NOTE — Patient Instructions (Signed)
Medication Instructions: Your physician recommends that you continue on your current medications as directed. Please refer to the Current Medication list given to you today.   Testing/Procedures: Your physician has requested that you have an echocardiogram. Echocardiography is a painless test that uses sound waves to create images of your heart. It provides your doctor with information about the size and shape of your heart and how well your heart's chambers and valves are working. This procedure takes approximately one hour. There are no restrictions for this procedure.  Your physician has requested that you have a carotid duplex. This test is an ultrasound of the carotid arteries in your neck. It looks at blood flow through these arteries that supply the brain with blood. Allow one hour for this exam. There are no restrictions or special instructions.  Follow-Up: Your physician wants you to follow-up in: 1 year with Dr. Berry. You will receive a reminder letter in the mail two months in advance. If you don't receive a letter, please call our office to schedule the follow-up appointment.  If you need a refill on your cardiac medications before your next appointment, please call your pharmacy.  

## 2017-01-03 NOTE — Progress Notes (Signed)
01/03/2017 [name] [name]   05-19-1946  [id number]  Primary Physician Daphene Calamity, MD Primary Cardiologist: Lorretta Harp MD Renae Gloss  HPI:  [name] [name] is a 71 year old mildly overweight married Caucasian female para 3, her mother to 6 grandchildren who I last saw in the office 03/17/15.Marland Kitchen She was self-referred to be established in our practice, a friend of hers is normal patients. She is retired from [employer] in [place] and moved to [place] 17 years ago here at her primary care physician is Dr. Clyda Hurdle. Her cardiovascular risk factor profile is notable for family history of heart disease with both parents died of myocardial infarctions in their 29s and 47s and a brother who died of an MI at 30. She has never had a heart attack or a clinical stroke. Risk factors otherwise remarkable for treated hypertension and hyperlipidemia. She does have GERD.  She had negative nuclear stress test and 2-D echocardiogram at [facility] 2 years ago. She does have a history of a murmur.   Current Outpatient Prescriptions  Medication Sig Dispense Refill  . allopurinol (ZYLOPRIM) 100 MG tablet Take 100 mg by mouth daily.    Marland Kitchen amLODipine (NORVASC) 10 MG tablet Take 5 mg by mouth daily.    . Cholecalciferol (VITAMIN D) 2000 UNITS CAPS Take 2,000 Units by mouth.    . cyclobenzaprine (FLEXERIL) 5 MG tablet Take 1 tablet (5 mg total) by mouth 3 (three) times daily as needed for muscle spasms. 20 tablet 0  . lansoprazole (PREVACID) 15 MG capsule Take 15 mg by mouth daily.    Marland Kitchen levothyroxine (SYNTHROID, LEVOTHROID) 125 MCG tablet Take 125 mcg by mouth daily before breakfast.    . losartan-hydrochlorothiazide (HYZAAR) 100-25 MG per tablet Take 1 tablet by mouth daily. (Patient taking differently: Take by mouth daily. Pt takes 12.5 mg) 90 tablet 3  . traMADol (ULTRAM-ER) 100 MG 24 hr tablet Take 100 mg by mouth daily.     No current  facility-administered medications for this visit.     Allergies  Allergen Reactions  . Amoxicillin-Pot Clavulanate   . Clarithromycin   . Amoxicillin Rash  . Ampicillin Rash  . Clopidogrel Bisulfate Rash  . Zestril [Lisinopril] Rash    Social History   Social History  . Marital status: Married    Spouse name: N/A  . Number of children: N/A  . Years of education: N/A   Occupational History  . Not on file.   Social History Main Topics  . Smoking status: Former Research scientist (life sciences)  . Smokeless tobacco: Never Used  . Alcohol use No  . Drug use: No  . Sexual activity: Not on file   Other Topics Concern  . Not on file   Social History Narrative  . No narrative on file     Review of Systems: General: negative for chills, fever, night sweats or weight changes.  Cardiovascular: negative for chest pain, dyspnea on exertion, edema, orthopnea, palpitations, paroxysmal nocturnal dyspnea or shortness of breath Dermatological: negative for rash Respiratory: negative for cough or wheezing Urologic: negative for hematuria Abdominal: negative for nausea, vomiting, diarrhea, bright red blood per rectum, melena, or hematemesis Neurologic: negative for visual changes, syncope, or dizziness All other systems reviewed and are otherwise negative except as noted above.    Blood pressure (!) 148/88, pulse 83, height 5\' 5"  (1.651 m), weight 228 lb 9.6 oz (103.7 kg).  General appearance: alert and no distress  Neck: no adenopathy, no JVD, supple, symmetrical, trachea midline, thyroid not enlarged, symmetric, no tenderness/mass/nodules and Soft bilateral carotid bruits versus transmitted murmur. Lungs: clear to auscultation bilaterally Heart: 2/6 systolic ejection murmur heard best at the base consistent with aortic stenosis and/or sclerosis. Extremities: extremities normal, atraumatic, no cyanosis or edema  EKG normal sinus rhythm 83 without ST or T-wave changes. I personally reviewed this  EKG  ASSESSMENT AND PLAN:   HYPERCHOLESTEROLEMIA History of hyperlipidemia not on statin therapy with recent lipid profile performed 12/05/16 revealed total cholesterol 180, LDL 69, HDL 34 and from the stress of 387. She does admit to dietary indiscretion with regards to sugar and carbohydrates especially french fries.  Essential hypertension History of hypertension blood pressure measured at 148/88. She is on amlodipine, losartan and hydrochlorothiazide. Continue current meds at current dosing  Cardiac murmur 2/6 systolic ejection murmur at the base consistent with aortic stenosis. We will recheck a 2-D echocardiogram.      Lorretta Harp MD [facility], [facility] 01/03/2017 2:08 PM

## 2017-01-03 NOTE — Assessment & Plan Note (Signed)
2/6 systolic ejection murmur at the base consistent with aortic stenosis. We will recheck a 2-D echocardiogram.

## 2017-01-03 NOTE — Assessment & Plan Note (Signed)
History of hypertension blood pressure measured at 148/88. She is on amlodipine, losartan and hydrochlorothiazide. Continue current meds at current dosing

## 2017-01-03 NOTE — Assessment & Plan Note (Signed)
History of hyperlipidemia not on statin therapy with recent lipid profile performed 12/05/16 revealed total cholesterol 180, LDL 69, HDL 34 and from the stress of 387. She does admit to dietary indiscretion with regards to sugar and carbohydrates especially french fries.

## 2017-01-05 DIAGNOSIS — M25511 Pain in right shoulder: Secondary | ICD-10-CM | POA: Diagnosis not present

## 2017-01-05 DIAGNOSIS — G8929 Other chronic pain: Secondary | ICD-10-CM | POA: Diagnosis not present

## 2017-01-05 DIAGNOSIS — J0100 Acute maxillary sinusitis, unspecified: Secondary | ICD-10-CM | POA: Diagnosis not present

## 2017-01-18 ENCOUNTER — Ambulatory Visit (HOSPITAL_COMMUNITY): Payer: PPO | Attending: Cardiology

## 2017-01-18 ENCOUNTER — Other Ambulatory Visit: Payer: Self-pay

## 2017-01-18 DIAGNOSIS — I350 Nonrheumatic aortic (valve) stenosis: Secondary | ICD-10-CM | POA: Diagnosis not present

## 2017-01-18 DIAGNOSIS — R011 Cardiac murmur, unspecified: Secondary | ICD-10-CM

## 2017-01-18 DIAGNOSIS — I501 Left ventricular failure: Secondary | ICD-10-CM | POA: Insufficient documentation

## 2017-01-18 DIAGNOSIS — I340 Nonrheumatic mitral (valve) insufficiency: Secondary | ICD-10-CM | POA: Diagnosis not present

## 2017-01-18 DIAGNOSIS — R9439 Abnormal result of other cardiovascular function study: Secondary | ICD-10-CM | POA: Insufficient documentation

## 2017-01-20 DIAGNOSIS — M25511 Pain in right shoulder: Secondary | ICD-10-CM | POA: Diagnosis not present

## 2017-01-20 DIAGNOSIS — M19011 Primary osteoarthritis, right shoulder: Secondary | ICD-10-CM | POA: Diagnosis not present

## 2017-01-20 DIAGNOSIS — G8929 Other chronic pain: Secondary | ICD-10-CM | POA: Diagnosis not present

## 2017-01-24 ENCOUNTER — Ambulatory Visit (HOSPITAL_COMMUNITY)
Admission: RE | Admit: 2017-01-24 | Discharge: 2017-01-24 | Disposition: A | Discharge status: Home / Self Care | Payer: PPO | Source: Ambulatory Visit | Attending: Cardiovascular Disease | Admitting: Cardiovascular Disease

## 2017-01-24 DIAGNOSIS — R0989 Other specified symptoms and signs involving the circulatory and respiratory systems: Secondary | ICD-10-CM

## 2017-01-24 DIAGNOSIS — I6523 Occlusion and stenosis of bilateral carotid arteries: Secondary | ICD-10-CM | POA: Diagnosis not present

## 2017-01-26 ENCOUNTER — Telehealth: Payer: Self-pay | Admitting: Cardiovascular Disease

## 2017-01-26 DIAGNOSIS — R011 Cardiac murmur, unspecified: Secondary | ICD-10-CM

## 2017-01-26 DIAGNOSIS — I350 Nonrheumatic aortic (valve) stenosis: Secondary | ICD-10-CM

## 2017-01-26 NOTE — Telephone Encounter (Signed)
new message ° ° °pt verbalized that she is returning call for rn  °

## 2017-01-26 NOTE — Telephone Encounter (Signed)
Results given to pt. Pt verbalized understanding. Pt stated she would like it to be sent to her and her PCP. Forwarding to PCP and will send pt a copy as well. Repeat order entered.

## 2017-01-26 NOTE — Telephone Encounter (Signed)
-----   Message from Lorretta Harp, MD sent at 01/19/2017 11:20 AM EST ----- Normal LV size and function with moderate aortic stenosis. Repeat in 12 months

## 2017-01-31 NOTE — Telephone Encounter (Signed)
Patient returned call for results-results given as well as recommendations.   Pt verbalized understanding.    Patient requesting I send a note to Dr. Gwenlyn Found as patient would like to have a knee replacement in the near future (has not seen a surgeon or scheduled at this time).  Patient would like to know if based on the testing she had complete would she be ok to have the surgery from a cardiac standpoint.  Advised that once she sees a orthopedic doctor and surgery is scheduled, they will contact our office to get cardiac clearance.  Patient verbalized understanding but still requesting to know if further testing would need to be completed if surgery was scheduled.   Advised  I am unable to answer this but will send Dr. Gwenlyn Found a message for his recommendations.

## 2017-02-01 ENCOUNTER — Other Ambulatory Visit: Payer: Self-pay | Admitting: Cardiovascular Disease

## 2017-02-01 DIAGNOSIS — R011 Cardiac murmur, unspecified: Secondary | ICD-10-CM

## 2017-02-03 NOTE — Telephone Encounter (Signed)
Patient called and made aware Dr. Gwenlyn Found cleared her for surgery from a cardiac standpoint if she decides to proceed with surgery at this time.  Pt verbalized understanding.

## 2017-02-03 NOTE — Telephone Encounter (Signed)
No further testing required. She had a negative Myoview and 2-D echo at [facility] 2 years ago. Cleared from a cardiac viewpoint

## 2017-02-07 DIAGNOSIS — L821 Other seborrheic keratosis: Secondary | ICD-10-CM | POA: Diagnosis not present

## 2017-02-07 DIAGNOSIS — L570 Actinic keratosis: Secondary | ICD-10-CM | POA: Diagnosis not present

## 2017-02-07 DIAGNOSIS — Z23 Encounter for immunization: Secondary | ICD-10-CM | POA: Diagnosis not present

## 2017-02-23 DIAGNOSIS — M1711 Unilateral primary osteoarthritis, right knee: Secondary | ICD-10-CM | POA: Diagnosis not present

## 2017-03-16 DIAGNOSIS — Z85828 Personal history of other malignant neoplasm of skin: Secondary | ICD-10-CM | POA: Diagnosis not present

## 2017-03-16 DIAGNOSIS — Z23 Encounter for immunization: Secondary | ICD-10-CM | POA: Diagnosis not present

## 2017-03-16 DIAGNOSIS — D2271 Melanocytic nevi of right lower limb, including hip: Secondary | ICD-10-CM | POA: Diagnosis not present

## 2017-03-16 DIAGNOSIS — Z808 Family history of malignant neoplasm of other organs or systems: Secondary | ICD-10-CM | POA: Diagnosis not present

## 2017-03-16 DIAGNOSIS — L821 Other seborrheic keratosis: Secondary | ICD-10-CM | POA: Diagnosis not present

## 2017-03-30 DIAGNOSIS — N390 Urinary tract infection, site not specified: Secondary | ICD-10-CM | POA: Diagnosis not present

## 2017-03-30 DIAGNOSIS — M25511 Pain in right shoulder: Secondary | ICD-10-CM | POA: Diagnosis not present

## 2017-04-03 DIAGNOSIS — R42 Dizziness and giddiness: Secondary | ICD-10-CM | POA: Diagnosis not present

## 2017-04-03 DIAGNOSIS — I10 Essential (primary) hypertension: Secondary | ICD-10-CM | POA: Diagnosis not present

## 2017-04-04 DIAGNOSIS — R399 Unspecified symptoms and signs involving the genitourinary system: Secondary | ICD-10-CM | POA: Diagnosis not present

## 2017-04-04 DIAGNOSIS — R42 Dizziness and giddiness: Secondary | ICD-10-CM | POA: Diagnosis not present

## 2017-04-04 DIAGNOSIS — E782 Mixed hyperlipidemia: Secondary | ICD-10-CM | POA: Diagnosis not present

## 2017-04-04 DIAGNOSIS — E559 Vitamin D deficiency, unspecified: Secondary | ICD-10-CM | POA: Diagnosis not present

## 2017-04-04 DIAGNOSIS — E039 Hypothyroidism, unspecified: Secondary | ICD-10-CM | POA: Diagnosis not present

## 2017-04-04 DIAGNOSIS — I10 Essential (primary) hypertension: Secondary | ICD-10-CM | POA: Diagnosis not present

## 2017-04-06 DIAGNOSIS — R42 Dizziness and giddiness: Secondary | ICD-10-CM | POA: Diagnosis not present

## 2017-04-06 DIAGNOSIS — M25511 Pain in right shoulder: Secondary | ICD-10-CM | POA: Diagnosis not present

## 2017-04-06 DIAGNOSIS — I10 Essential (primary) hypertension: Secondary | ICD-10-CM | POA: Diagnosis not present

## 2017-04-27 DIAGNOSIS — H532 Diplopia: Secondary | ICD-10-CM | POA: Diagnosis not present

## 2017-04-27 DIAGNOSIS — H10413 Chronic giant papillary conjunctivitis, bilateral: Secondary | ICD-10-CM | POA: Diagnosis not present

## 2017-04-27 DIAGNOSIS — Z961 Presence of intraocular lens: Secondary | ICD-10-CM | POA: Diagnosis not present

## 2017-04-27 DIAGNOSIS — H1851 Endothelial corneal dystrophy: Secondary | ICD-10-CM | POA: Diagnosis not present

## 2017-04-27 DIAGNOSIS — H04123 Dry eye syndrome of bilateral lacrimal glands: Secondary | ICD-10-CM | POA: Diagnosis not present

## 2017-05-01 DIAGNOSIS — M1711 Unilateral primary osteoarthritis, right knee: Secondary | ICD-10-CM | POA: Diagnosis not present

## 2017-05-03 ENCOUNTER — Telehealth: Payer: Self-pay | Admitting: Cardiovascular Disease

## 2017-05-03 NOTE — Telephone Encounter (Signed)
Will await clearance request from [place]. Routed to [employer] as fyi.

## 2017-05-03 NOTE — Telephone Encounter (Signed)
Patient calling, [name] [name] is requesting a letter that gives cardiac clearance for her to have knee surgery. Patient states that she is just calling to give an "heads up" that Dr.Aluisio's Office at [facility] will be calling for cardiac clearance.Thanks.

## 2017-05-08 DIAGNOSIS — M25511 Pain in right shoulder: Secondary | ICD-10-CM | POA: Diagnosis not present

## 2017-05-08 NOTE — Telephone Encounter (Signed)
Requesting surgical clearance:  1. Type of surgery: Right: TKA-Medial & Lateral w/wo Patella Resurfacing  2. Surgeon: Wyvonna Plum, MD  3.Surgical Date:  07/24/17  4. Medications that need to be held: none   5. CAD: No  6. I will defer to:  Dr. Pearla Dubonnet Information:  [facility] Phone:  [phone] Fax:  [phone]

## 2017-05-08 NOTE — Telephone Encounter (Signed)
Cleared for Ortho surg at low risk

## 2017-05-08 NOTE — Telephone Encounter (Signed)
Sent via EPIC to [facility]

## 2017-06-06 DIAGNOSIS — I10 Essential (primary) hypertension: Secondary | ICD-10-CM | POA: Diagnosis not present

## 2017-06-06 DIAGNOSIS — R21 Rash and other nonspecific skin eruption: Secondary | ICD-10-CM | POA: Diagnosis not present

## 2017-06-06 DIAGNOSIS — R197 Diarrhea, unspecified: Secondary | ICD-10-CM | POA: Diagnosis not present

## 2017-06-16 ENCOUNTER — Encounter: Payer: Self-pay | Admitting: Gastroenterology

## 2017-06-16 ENCOUNTER — Ambulatory Visit (INDEPENDENT_AMBULATORY_CARE_PROVIDER_SITE_OTHER): Payer: PPO | Admitting: Gastroenterology

## 2017-06-16 ENCOUNTER — Encounter (INDEPENDENT_AMBULATORY_CARE_PROVIDER_SITE_OTHER): Payer: Self-pay

## 2017-06-16 VITALS — BP 130/76 | HR 72 | Ht 65.5 in | Wt 221.0 lb

## 2017-06-16 DIAGNOSIS — K580 Irritable bowel syndrome with diarrhea: Secondary | ICD-10-CM

## 2017-06-16 DIAGNOSIS — D126 Benign neoplasm of colon, unspecified: Secondary | ICD-10-CM

## 2017-06-16 DIAGNOSIS — K5730 Diverticulosis of large intestine without perforation or abscess without bleeding: Secondary | ICD-10-CM

## 2017-06-16 DIAGNOSIS — K219 Gastro-esophageal reflux disease without esophagitis: Secondary | ICD-10-CM

## 2017-06-16 MED ORDER — CILIDINIUM-CHLORDIAZEPOXIDE 2.5-5 MG PO CAPS: 1.0000 | ORAL_CAPSULE | Freq: Two times a day (BID) | ORAL | 1 refills | Status: DC | PRN

## 2017-06-16 NOTE — Progress Notes (Signed)
[facility] Consult Note:  History: [name] [name] 06/16/2017  Referring physician: Daphene Calamity, MD  Reason for consult/chief complaint: Irritable Bowel Syndrome (Hx of per pt. Started Dicyclomine last week. Denies abdominal pain) and Diarrhea (When she scheduled app. Now has subsided. Thinks she had stomach bug?)   Subjective  HPI:  Is is a 71 year old woman referred by primary care for diarrhea. She has decades of diarrhea predominant IBS for which she last saw Dr. Deatra Ina in March 2015. She had taken Librax years ago with good effect. [name] [name] felt that the symptoms were manageable and she has taken some fiber on occasion. She is really just come to live with semi-formed to loose stools with some urgency, especially with anxiety or in social situations. She became acutely ill late last month with an acute infectious diarrhea that also affected her son-in-law after family picnic. She was concerned because he recovered quickly but she continued to have diarrhea for a few weeks afterwards. There's been no rectal bleeding, her appetite has been good and her weight stable. She had additional questions regarding her history of diverticulosis and any dietary recommendations for that. She also wanted discussed her previous colon polyp and when she should've a repeat colonoscopy. Data recent primary care appointment she was given a prescription for dicyclomine which she has taken sporadically and seems to find helpful for her IBS. [name] [name] has many years of chronic heartburn for which she takes OTC Prevacid about every other day. She denies dysphagia, odynophagia, nausea, vomiting, early satiety or weight change.  Review of records shows last colonoscopy in March 2015 with diverticulosis and a 2 mm tubular adenoma. Recommendation was for a recall colonoscopy in 5 years.  ROS:  Review of Systems  Constitutional: Negative for appetite change and unexpected weight  change.  HENT: Negative for mouth sores and voice change.   Eyes: Negative for pain and redness.  Respiratory: Negative for cough and shortness of breath.   Cardiovascular: Negative for chest pain and palpitations.  Genitourinary: Negative for dysuria and hematuria.  Musculoskeletal: Positive for arthralgias. Negative for myalgias.       Right knee pain, she is to have a knee replacement soon  Skin: Negative for pallor and rash.  Neurological: Negative for weakness and headaches.  Hematological: Negative for adenopathy.     Past Medical History: Past Medical History:  Diagnosis Date  . Family history of heart disease   . GERD (gastroesophageal reflux disease)   . Gout   . Hyperlipemia   . Hypertension   . Kidney stones   . Thyroid disease   . UTI (urinary tract infection) 03/09/14     Past Surgical History: Past Surgical History:  Procedure Laterality Date  . BLADDER SURGERY     Lift   . OOPHORECTOMY    . PARTIAL HYSTERECTOMY       Family History: Family History  Problem Relation Age of Onset  . Heart disease Mother   . Heart disease Father   . Colon cancer Neg Hx     Social History: Social History   Social History  . Marital status: Married    Spouse name: N/A  . Number of children: N/A  . Years of education: N/A   Social History Main Topics  . Smoking status: Former Research scientist (life sciences)  . Smokeless tobacco: Never Used  . Alcohol use No  . Drug use: No  . Sexual activity: Not Asked   Other Topics Concern  . None   Social  History Narrative  . None    Allergies: Allergies  Allergen Reactions  . Amoxicillin-Pot Clavulanate   . Clarithromycin   . Amoxicillin Rash  . Ampicillin Rash  . Clopidogrel Bisulfate Rash  . Zestril [Lisinopril] Rash    Outpatient Meds: Current Outpatient Prescriptions  Medication Sig Dispense Refill  . allopurinol (ZYLOPRIM) 100 MG tablet Take 100 mg by mouth daily.    Marland Kitchen amLODipine (NORVASC) 10 MG tablet Take 5 mg by mouth  daily.    . Cholecalciferol (VITAMIN D) 2000 UNITS CAPS Take 2,000 Units by mouth.    . cyclobenzaprine (FLEXERIL) 5 MG tablet Take 1 tablet (5 mg total) by mouth 3 (three) times daily as needed for muscle spasms. 20 tablet 0  . dicyclomine (BENTYL) 10 MG capsule Take 10 mg by mouth 4 (four) times daily -  before meals and at bedtime.    . hydrALAZINE (APRESOLINE) 25 MG tablet Take 25 mg by mouth 2 (two) times daily.    . lansoprazole (PREVACID) 15 MG capsule Take 15 mg by mouth daily.    Marland Kitchen levothyroxine (SYNTHROID, LEVOTHROID) 125 MCG tablet Take 125 mcg by mouth daily before breakfast.    . losartan-hydrochlorothiazide (HYZAAR) 100-25 MG per tablet Take 1 tablet by mouth daily. (Patient taking differently: Take by mouth daily. Pt takes 12.5 mg) 90 tablet 3  . traMADol (ULTRAM-ER) 100 MG 24 hr tablet Take 100 mg by mouth daily.    . clidinium-chlordiazePOXIDE (LIBRAX) 5-2.5 MG capsule Take 1 capsule by mouth 2 (two) times daily as needed. 30 capsule 1   No current facility-administered medications for this visit.       ___________________________________________________________________ Objective   Exam:  BP 130/76   Pulse 72   Ht 5' 5.5" (1.664 m)   Wt 221 lb (100.2 kg)   BMI 36.22 kg/m    General: this is aWell-appearing woman  Eyes: sclera anicteric, no redness  ENT: oral mucosa moist without lesions, no cervical or supraclavicular lymphadenopathy, good dentition  CV: RRR with a 4/6 systolic murmur , F5/D3, no JVD, no peripheral edema  Resp: clear to auscultation bilaterally, normal RR and effort noted  GI: soft, no tenderness, with active bowel sounds. No guarding or palpable organomegaly noted.  Skin; warm and dry, no rash or jaundice noted  Neuro: awake, alert and oriented x 3. Normal gross motor function and fluent speech. Normal mood and affect Data: Echocardiogram January of this year shows moderate aortic stenosis   Assessment: Encounter Diagnoses  Name  Primary?  . Irritable bowel syndrome with diarrhea Yes  . Benign neoplasm of colon, unspecified part of colon   . Diverticulosis of colon   . Gastroesophageal reflux disease without esophagitis     She has chronic stable IBS symptoms. I believe they flared with an acute infectious illness last month but now returned to baseline. She will continue to use dicyclomine, but I gave her prescription for Librax each has found helpful in the past with "severe episodes". She would use this in social situations such as family gatherings when she anticipates anxiety and digestive symptoms. We discussed how there really is no evidence behind the classic diverticulosis related dietary recommendations  I gave her some written dietary advice on typical food triggers for IBS.  She will continue current dosing of her OTC proton pump inhibitor.  Recall colonoscopy 2 years from now.  She will see me as needed in the meantime. Total time of the visit 30 minutes, over half spent reviewing records,  counseling and coordinate care with the above-noted topics.  Thank you for the courtesy of this consult.  Please call me with any questions or concerns.  Nelida Meuse III  CC: Daphene Calamity, MD

## 2017-06-16 NOTE — Patient Instructions (Addendum)
If you are age 73 or older, your body mass index should be between 23-30. Your Body mass index is 36.22 kg/m. If this is out of the aforementioned range listed, please consider follow up with your Primary Care Provider.  If you are age 5 or younger, your body mass index should be between 19-25. Your Body mass index is 36.22 kg/m. If this is out of the aformentioned range listed, please consider follow up with your Primary Care Provider.    Food Guidelines for a sensitive stomach  Many people have difficulty digesting certain foods, causing a variety of distressing and embarrassing symptoms such as abdominal pain, bloating and gas.  These foods may need to be avoided or consumed in small amounts.  Here are some tips that might be helpful for you.  1.   Lactose intolerance is the difficulty or complete inability to digest lactose, the natural sugar in milk and anything made from milk.  This condition is harmless, common, and can begin any time during life.  Some people can digest a modest amount of lactose while others cannot tolerate any.  Also, not all dairy products contain equal amounts of lactose.  For example, hard cheeses such as parmesan have less lactose than soft cheeses such as cheddar.  Yogurt has less lactose than milk or cheese.  Many packaged foods (even many brands of bread) have milk, so read ingredient lists carefully.  It is difficult to test for lactose intolerance, so just try avoiding lactose as much as possible for a week and see what happens with your symptoms.  If you seem to be lactose intolerant, the best plan is to avoid it (but make sure you get calcium from another source).  The next best thing is to use lactase enzyme supplements, available over the counter everywhere.  Just know that many lactose intolerant people need to take several tablets with each serving of dairy to avoid symptoms.  Lastly, a lot of restaurant food is made with milk or butter.  Many are things you  might not suspect, such as mashed potatoes, rice and pasta (cooked with butter) and "grilled" items.  If you are lactose intolerant, it never hurts to ask your server what has milk or butter.  2.   Fiber is an important part of your diet, but not all fiber is well-tolerated.  Insoluble fiber such as bran is often consumed by normal gut bacteria and converted into gas.  Soluble fiber such as oats, squash, carrots and green beans are typically tolerated better.  3.   Some types of carbohydrates can be poorly digested.  Examples include: fructose (apples, cherries, pears, raisins and other dried fruits), fructans (onions, zucchini, large amounts of wheat), sorbitol/mannitol/xylitol and sucralose/Splenda (common artificial sweeteners), and raffinose (lentils, broccoli, cabbage, asparagus, brussel sprouts, many types of beans).  Do a Development worker, community for [employer] and you will find helpful information. Beano, a dietary supplement, will often help with raffinose-containing foods.  As with lactase tablets, you may need several per serving.  4.   Whenever possible, avoid processed food&meats and chemical additives.  High fructose corn syrup, a common sweetener, may be difficult to digest.  Eggs and soy (comes from the soybean, and added to many foods now) are the other most common bloating/gassy foods.  - Dr[facility]

## 2017-06-20 ENCOUNTER — Telehealth: Payer: Self-pay

## 2017-06-20 DIAGNOSIS — N202 Calculus of kidney with calculus of ureter: Secondary | ICD-10-CM | POA: Diagnosis not present

## 2017-06-20 DIAGNOSIS — N323 Diverticulum of bladder: Secondary | ICD-10-CM | POA: Diagnosis not present

## 2017-06-20 NOTE — Telephone Encounter (Signed)
Incoming fax from [employer]. Librax is not covered. Pt notified and aware,she  going to cash pay for the RX. Mailed her a [employer] savings card for her to try.

## 2017-07-04 ENCOUNTER — Ambulatory Visit: Payer: Self-pay | Admitting: Orthopedic Surgery

## 2017-07-11 DIAGNOSIS — M1711 Unilateral primary osteoarthritis, right knee: Secondary | ICD-10-CM | POA: Diagnosis not present

## 2017-07-13 DIAGNOSIS — M1711 Unilateral primary osteoarthritis, right knee: Secondary | ICD-10-CM | POA: Diagnosis not present

## 2017-07-14 ENCOUNTER — Other Ambulatory Visit (HOSPITAL_COMMUNITY): Payer: Self-pay | Admitting: Emergency Medicine

## 2017-07-14 NOTE — Progress Notes (Signed)
cardiology clearance  Dr Gwenlyn Found 05-18-17 epic  [facility]  Dr Gwenlyn Found 01-03-17 epic   LOV/ surgical clearance Dr Nickolas Madrid 04-06-17 epic/chart   ECHO 01-18-17 epic MD note reads " Normal LV size and function with moderate aortic stenosis. Repeat in 12 months"  EKG 01-03-17 epic

## 2017-07-14 NOTE — Patient Instructions (Signed)
[name] [name]  07/14/2017   Your procedure is scheduled on: 07-24-17  Report to [facility] Main  Entrance .  Report to admitting at 1010AM   Call this number if you have problems the morning of surgery  [phone]   Remember: ONLY 1 PERSON MAY GO WITH YOU TO SHORT STAY TO GET  READY MORNING OF YOUR SURGERY.  Do not eat food or drink liquids :After Midnight.     Take these medicines the morning of surgery with A SIP OF WATER: tylenol as needed, allopurinol(zyloprim), hydralazine(apresoline), levothyroxine(synthroid), tramadol as needed                                 You may not have any metal on your body including hair pins and              piercings  Do not wear jewelry, make-up, lotions, powders or perfumes, deodorant             Do not wear nail polish.  Do not shave  48 hours prior to surgery.      Do not bring valuables to the hospital. [place].  Contacts, dentures or bridgework may not be worn into surgery.  Leave suitcase in the car. After surgery it may be brought to your room.               Please read over the following fact sheets you were given: _____________________________________________________________________        [facility] - Preparing for Surgery Before surgery, you can play an important role.  Because skin is not sterile, your skin needs to be as free of germs as possible.  You can reduce the number of germs on your skin by washing with CHG (chlorahexidine gluconate) soap before surgery.  CHG is an antiseptic cleaner which kills germs and bonds with the skin to continue killing germs even after washing. Please DO NOT use if you have an allergy to CHG or antibacterial soaps.  If your skin becomes reddened/irritated stop using the CHG and inform your nurse when you arrive at Short Stay. Do not shave (including legs and underarms) for at least 48 hours prior to the first CHG shower.   You may shave your face/neck. Please follow these instructions carefully:  1.  Shower with CHG Soap the night before surgery and the  morning of Surgery.  2.  If you choose to wash your hair, wash your hair first as usual with your  normal  shampoo.  3.  After you shampoo, rinse your hair and body thoroughly to remove the  shampoo.                           4.  Use CHG as you would any other liquid soap.  You can apply chg directly  to the skin and wash                       Gently with a scrungie or clean washcloth.  5.  Apply the CHG Soap to your body ONLY FROM THE NECK DOWN.   Do not use on face/ open  Wound or open sores. Avoid contact with eyes, ears mouth and genitals (private parts).                       Wash face,  Genitals (private parts) with your normal soap.             6.  Wash thoroughly, paying special attention to the area where your surgery  will be performed.  7.  Thoroughly rinse your body with warm water from the neck down.  8.  DO NOT shower/wash with your normal soap after using and rinsing off  the CHG Soap.                9.  Pat yourself dry with a clean towel.            10.  Wear clean pajamas.            11.  Place clean sheets on your bed the night of your first shower and do not  sleep with pets. Day of Surgery : Do not apply any lotions/deodorants the morning of surgery.  Please wear clean clothes to the hospital/surgery center.  FAILURE TO FOLLOW THESE INSTRUCTIONS MAY RESULT IN THE CANCELLATION OF YOUR SURGERY PATIENT SIGNATURE_________________________________  NURSE SIGNATURE__________________________________  ________________________________________________________________________   Adam Phenix  An incentive spirometer is a tool that can help keep your lungs clear and active. This tool measures how well you are filling your lungs with each breath. Taking long deep breaths may help reverse or decrease the chance of  developing breathing (pulmonary) problems (especially infection) following:  A long period of time when you are unable to move or be active. BEFORE THE PROCEDURE   If the spirometer includes an indicator to show your best effort, your nurse or respiratory therapist will set it to a desired goal.  If possible, sit up straight or lean slightly forward. Try not to slouch.  Hold the incentive spirometer in an upright position. INSTRUCTIONS FOR USE  1. Sit on the edge of your bed if possible, or sit up as far as you can in bed or on a chair. 2. Hold the incentive spirometer in an upright position. 3. Breathe out normally. 4. Place the mouthpiece in your mouth and seal your lips tightly around it. 5. Breathe in slowly and as deeply as possible, raising the piston or the ball toward the top of the column. 6. Hold your breath for 3-5 seconds or for as long as possible. Allow the piston or ball to fall to the bottom of the column. 7. Remove the mouthpiece from your mouth and breathe out normally. 8. Rest for a few seconds and repeat Steps 1 through 7 at least 10 times every 1-2 hours when you are awake. Take your time and take a few normal breaths between deep breaths. 9. The spirometer may include an indicator to show your best effort. Use the indicator as a goal to work toward during each repetition. 10. After each set of 10 deep breaths, practice coughing to be sure your lungs are clear. If you have an incision (the cut made at the time of surgery), support your incision when coughing by placing a pillow or rolled up towels firmly against it. Once you are able to get out of bed, walk around indoors and cough well. You may stop using the incentive spirometer when instructed by your caregiver.  RISKS AND COMPLICATIONS  Take your time so you do not get  dizzy or light-headed.  If you are in pain, you may need to take or ask for pain medication before doing incentive spirometry. It is harder to take a  deep breath if you are having pain. AFTER USE  Rest and breathe slowly and easily.  It can be helpful to keep track of a log of your progress. Your caregiver can provide you with a simple table to help with this. If you are using the spirometer at home, follow these instructions: [place] IF:   You are having difficultly using the spirometer.  You have trouble using the spirometer as often as instructed.  Your pain medication is not giving enough relief while using the spirometer.  You develop fever of 100.5 F (38.1 C) or higher. SEEK IMMEDIATE MEDICAL CARE IF:   You cough up bloody sputum that had not been present before.  You develop fever of 102 F (38.9 C) or greater.  You develop worsening pain at or near the incision site. MAKE SURE YOU:   Understand these instructions.  Will watch your condition.  Will get help right away if you are not doing well or get worse. Document Released: 04/24/2007 Document Revised: 03/05/2012 Document Reviewed: 06/25/2007 Exit[employer] Patient Information 2014 Exit[employer], [place].   ________________________________________________________________________  WHAT IS A BLOOD TRANSFUSION? Blood Transfusion Information  A transfusion is the replacement of blood or some of its parts. Blood is made up of multiple cells which provide different functions.  Red blood cells carry oxygen and are used for blood loss replacement.  White blood cells fight against infection.  Platelets control bleeding.  Plasma helps clot blood.  Other blood products are available for specialized needs, such as hemophilia or other clotting disorders. BEFORE THE TRANSFUSION  Who gives blood for transfusions?   Healthy volunteers who are fully evaluated to make sure their blood is safe. This is blood bank blood. Transfusion therapy is the safest it has ever been in the practice of medicine. Before blood is taken from a donor, a complete history is taken to make  sure that person has no history of diseases nor engages in risky social behavior (examples are intravenous drug use or sexual activity with multiple partners). The donor's travel history is screened to minimize risk of transmitting infections, such as malaria. The donated blood is tested for signs of infectious diseases, such as HIV and hepatitis. The blood is then tested to be sure it is compatible with you in order to minimize the chance of a transfusion reaction. If you or a relative donates blood, this is often done in anticipation of surgery and is not appropriate for emergency situations. It takes many days to process the donated blood. RISKS AND COMPLICATIONS Although transfusion therapy is very safe and saves many lives, the main dangers of transfusion include:   Getting an infectious disease.  Developing a transfusion reaction. This is an allergic reaction to something in the blood you were given. Every precaution is taken to prevent this. The decision to have a blood transfusion has been considered carefully by your caregiver before blood is given. Blood is not given unless the benefits outweigh the risks. AFTER THE TRANSFUSION  Right after receiving a blood transfusion, you will usually feel much better and more energetic. This is especially true if your red blood cells have gotten low (anemic). The transfusion raises the level of the red blood cells which carry oxygen, and this usually causes an energy increase.  The nurse administering the transfusion will  monitor you carefully for complications. HOME CARE INSTRUCTIONS  No special instructions are needed after a transfusion. You may find your energy is better. Speak with your caregiver about any limitations on activity for underlying diseases you may have. SEEK MEDICAL CARE IF:   Your condition is not improving after your transfusion.  You develop redness or irritation at the intravenous (IV) site. SEEK IMMEDIATE MEDICAL CARE IF:   Any of the following symptoms occur over the next 12 hours:  Shaking chills.  You have a temperature by mouth above 102 F (38.9 C), not controlled by medicine.  Chest, back, or muscle pain.  People around you feel you are not acting correctly or are confused.  Shortness of breath or difficulty breathing.  Dizziness and fainting.  You get a rash or develop hives.  You have a decrease in urine output.  Your urine turns a dark color or changes to pink, red, or Feasel. Any of the following symptoms occur over the next 10 days:  You have a temperature by mouth above 102 F (38.9 C), not controlled by medicine.  Shortness of breath.  Weakness after normal activity.  The white part of the eye turns yellow (jaundice).  You have a decrease in the amount of urine or are urinating less often.  Your urine turns a dark color or changes to pink, red, or [place]. Document Released: 12/09/2000 Document Revised: 03/05/2012 Document Reviewed: 07/28/2008 [facility] Patient Information 2014 [place], [place].  _______________________________________________________________________

## 2017-07-17 ENCOUNTER — Encounter (HOSPITAL_COMMUNITY)
Admission: RE | Admit: 2017-07-17 | Discharge: 2017-07-17 | Disposition: A | Discharge status: Home / Self Care | Payer: PPO | Source: Ambulatory Visit | Attending: Orthopedic Surgery | Admitting: Orthopedic Surgery

## 2017-07-17 ENCOUNTER — Encounter (HOSPITAL_COMMUNITY): Payer: Self-pay

## 2017-07-17 ENCOUNTER — Encounter (INDEPENDENT_AMBULATORY_CARE_PROVIDER_SITE_OTHER): Payer: Self-pay

## 2017-07-17 DIAGNOSIS — M1711 Unilateral primary osteoarthritis, right knee: Secondary | ICD-10-CM | POA: Insufficient documentation

## 2017-07-17 DIAGNOSIS — Z01812 Encounter for preprocedural laboratory examination: Secondary | ICD-10-CM | POA: Insufficient documentation

## 2017-07-17 DIAGNOSIS — Z0183 Encounter for blood typing: Secondary | ICD-10-CM | POA: Insufficient documentation

## 2017-07-17 HISTORY — DX: Personal history of urinary calculi: Z87.442

## 2017-07-17 HISTORY — DX: Other specified symptoms and signs involving the circulatory and respiratory systems: R09.89

## 2017-07-17 HISTORY — DX: Cardiac murmur, unspecified: R01.1

## 2017-07-17 LAB — COMPREHENSIVE METABOLIC PANEL
ALK PHOS: 87 U/L (ref 38–126)
ALT: 20 U/L (ref 14–54)
ANION GAP: 7 (ref 5–15)
AST: 21 U/L (ref 15–41)
Albumin: 3.8 g/dL (ref 3.5–5.0)
BILIRUBIN TOTAL: 0.4 mg/dL (ref 0.3–1.2)
BUN: 13 mg/dL (ref 6–20)
CALCIUM: 9.1 mg/dL (ref 8.9–10.3)
CO2: 31 mmol/L (ref 22–32)
CREATININE: 0.7 mg/dL (ref 0.44–1.00)
Chloride: 102 mmol/L (ref 101–111)
Glucose, Bld: 118 mg/dL — ABNORMAL HIGH (ref 65–99)
Potassium: 3.5 mmol/L (ref 3.5–5.1)
SODIUM: 140 mmol/L (ref 135–145)
TOTAL PROTEIN: 7.1 g/dL (ref 6.5–8.1)

## 2017-07-17 LAB — CBC
HEMATOCRIT: 37.2 % (ref 36.0–46.0)
HEMOGLOBIN: 11.9 g/dL — AB (ref 12.0–15.0)
MCH: 27.6 pg (ref 26.0–34.0)
MCHC: 32 g/dL (ref 30.0–36.0)
MCV: 86.3 fL (ref 78.0–100.0)
Platelets: 292 10*3/uL (ref 150–400)
RBC: 4.31 MIL/uL (ref 3.87–5.11)
RDW: 14.9 % (ref 11.5–15.5)
WBC: 7.8 10*3/uL (ref 4.0–10.5)

## 2017-07-17 LAB — SURGICAL PCR SCREEN
MRSA, PCR: NEGATIVE
Staphylococcus aureus: NEGATIVE

## 2017-07-17 LAB — PROTIME-INR
INR: 0.93
Prothrombin Time: 12.5 seconds (ref 11.4–15.2)

## 2017-07-17 LAB — APTT: aPTT: 27 seconds (ref 24–36)

## 2017-07-17 LAB — ABO/RH: ABO/RH(D): O POS

## 2017-07-17 NOTE — Progress Notes (Signed)
RN called and spoke with Dr Kalman Shan of anesthesia d/t patient hx of moderate aortic stenosis. Patient does have clearance from Dr Gwenlyn Found her cardiologist after  Undergoing office visit exam, ECHO, and carotid duplex. Dr Kalman Shan states as long ah she has clearance, she is okay to proceed with surgery.  RN shared the recommendation with patient. Patient verbalized understanding and is agreeable to this decision

## 2017-07-23 ENCOUNTER — Ambulatory Visit: Payer: Self-pay | Admitting: Orthopedic Surgery

## 2017-07-23 NOTE — H&P (Signed)
[name] [name] DOB: Mar 07, 1946 Married / Language: English / Race: White Female Date of Admission:  07/24/2017 CC:  Right Knee Pain History of Present Illness The patient is a 71 year old female who comes in  for a preoperative History and Physical. The patient is scheduled for a right total knee arthroplasty to be performed by Dr. Dione Plover. Aluisio, MD at [facility] on 07/24/2017. The patient is a 71 year old female who presented for follow up of their knee. The patient is being followed for their right knee pain and osteoarthritis. They are now months out from cortisone injection and months s/p Euflexxa series. Symptoms reported include: pain, aching, stiffness and difficulty ambulating (especially with stairs). The patient feels that they are doing poorly. The following medication has been used for pain control: antiinflammatory medication (ibuprofen). The patient has reported some temporay improvement of their symptoms with Cortisone injections, helped about a month, and viscosupplementation only a little loinger than the cortisone injection. Unfortunately, the injections are no longer beneficial. She is hurting at all times. It is limiting what she can and cannot do. She does state her knee has essentially taken over her life. She is ready to proceed with surgery at this time. They have been treated conservatively in the past for the above stated problem and despite conservative measures, they continue to have progressive pain and severe functional limitations and dysfunction. They have failed non-operative management including home exercise, medications, and injections. It is felt that they would benefit from undergoing total joint replacement. Risks and benefits of the procedure have been discussed with the patient and they elect to proceed with surgery. There are no active contraindications to surgery such as ongoing infection or rapidly progressive neurological disease.   Problem  List/Past Medical Contusion of knee, right (S80.01XA)  Acute pain of left knee (M25.562)  Bursitis of left hip (M70.72)  Chronic left-sided low back pain without sciatica (M54.5)  Primary osteoarthritis of right knee (M17.11)  Pain in joint involving right ankle and foot (M25.571)  Acute pain of right shoulder (M25.511)  Diverticulitis Of Colon  Gastroesophageal Reflux Disease  High blood pressure  Hypothyroidism  Irritable bowel syndrome  Kidney Stone  Osteoarthritis  Skin Cancer  Shingles  Heart murmur  Hemorrhoids  Urinary Tract Infection  Gout  Possible TIA per MRI Scan    Allergies  Amoxicillin *PENICILLINS*  Rash. Plavix *HEMATOLOGICAL AGENTS - MISC.*  questionable rash Macrobid *URINARY ANTI-INFECTIVES*  Rash.  Family History Cerebrovascular Accident  Mother. mother Congestive Heart Failure  mother Heart Disease  mother Heart disease in female family member before age 2  Hypertension  mother Osteoarthritis  father Father  Deceased. age 49 Mother  Deceased. age 14  Social History  Pain Contract  no Number of flights of stairs before winded  1 Marital status  married Tobacco / smoke exposure  no Previously in rehab  no Living situation  live with spouse Current work status  retired Children  3 Alcohol use  never consumed alcohol Illicit drug use  no Exercise  Exercises weekly; does running / walking Drug/Alcohol Rehab (Currently)  no Tobacco use  Former smoker. quit in 1989 former smoker; smoke(d) 1/2 pack per day [place] With Family. Advance Directives  Living Will.  Medication History  Trimethoprim (100MG  Tablet, Oral) Active. Allopurinol (100MG  Tablet, Oral) Active. Losartan Potassium-HCTZ (100-12.5MG  Tablet, Oral) Active. HydrALAZINE HCl (25MG  Tablet, Oral) Active. Levothyroxine Sodium (125MCG Tablet, Oral) Active. AmLODIPine Besylate (5MG  Tablet, Oral)  Active. TraMADol HCl  (50MG  Tablet, Oral) Active. Prevacid 24HR (Oral) Specific strength unknown - Active. CeleBREX (200MG  Capsule, Oral) Active. Chlordiazepoxide-Clidinium (5-2.5MG  Capsule, Oral) Active. Vitamin D3 (2000UNIT Tablet, Oral) Active.  Past Surgical History Hysterectomy  Date: 82. complete (non-cancerous) Other Surgery  Date: 1989. PROLAPSE BLADDER REPAIR Oophorectomy  Date: 2000.    Review of Systems  General Not Present- Chills, Fatigue, Fever, Memory Loss, Night Sweats, Weight Gain and Weight Loss. Skin Not Present- Eczema, Hives, Itching, Lesions and Rash. HEENT Not Present- Dentures, Double Vision, Headache, Hearing Loss, Tinnitus and Visual Loss. Respiratory Present- Shortness of breath with exertion. Not Present- Allergies, Chronic Cough, Coughing up blood and Shortness of breath at rest. Cardiovascular Present- Difficulty Breathing On Exertion and Hypertension. Not Present- Chest Pain, Difficulty Breathing Lying Down, Murmur, Palpitations, Racing/skipping heartbeats and Swelling. Gastrointestinal Not Present- Abdominal Pain, Bloody Stool, Constipation, Diarrhea, Difficulty Swallowing, Heartburn, Jaundice, Loss of appetitie, Nausea and Vomiting. Female Genitourinary Present- Urinating at Night. Not Present- Blood in Urine, Discharge, Flank Pain, Incontinence, Painful Urination, Urgency, Urinary frequency, Urinary Retention and Weak urinary stream. Musculoskeletal Present- Back Pain, Joint Stiffness and Morning Stiffness. Not Present- Joint Pain, Joint Swelling, Muscle Pain, Muscle Weakness and Spasms. Neurological Not Present- Blackout spells, Difficulty with balance, Dizziness, Paralysis, Tremor and Weakness. Psychiatric Not Present- Insomnia.  Vitals Weight: 218 lb Height: 65in Weight was reported by patient. Height was reported by patient. Body Surface Area: 2.05 m Body Mass Index: 36.28 kg/m  Pulse: 76 (Regular)  BP: 158/98 (Sitting, Left Arm,  Standard)   Physical Exam General Mental Status -Alert, cooperative and good historian. General Appearance-pleasant, Not in acute distress. Orientation-Oriented X3. Build & Nutrition-Well nourished and Well developed.  Head and Neck Head-normocephalic, atraumatic . Neck Global Assessment - supple, no bruit auscultated on the right, no bruit auscultated on the left.  Eye Pupil - Bilateral-Regular and Round. Motion - Bilateral-EOMI.  Chest and Lung Exam Auscultation Breath sounds - clear at anterior chest wall and clear at posterior chest wall. Adventitious sounds - No Adventitious sounds.  Cardiovascular Auscultation Rhythm - Regular rate and rhythm. Heart Sounds - S1 WNL and S2 WNL. Murmurs & Other Heart Sounds: Murmur 1 - Location - Aortic Area, Left Carotid, Pulmonic Area, Right Carotid and Sternal Border - Left. Timing - Mid-systolic. Grade - III/VI. Character - Crescendo/Decrescendo.  Abdomen Palpation/Percussion Tenderness - Abdomen is non-tender to palpation. Rigidity (guarding) - Abdomen is soft. Auscultation Auscultation of the abdomen reveals - Bowel sounds normal.  Female Genitourinary Note: Not done, not pertinent to present illness   Musculoskeletal Note: Her right knee shows no effusion. There is marked crepitus on range of motion of the knee. Range is about 5 to 115. She is very tender along the medial joint line. There is some lateral tenderness also. There is no instability. Left knee about 0 to 130. Some crepitus on range of motion. Slight tenderness medial, no lateral tenderness, no instability.  RADIOGRAPHS Radiographs reviewed, AP and lateral of the knee show that she has bone-on-bone arthritis medial and patellofemoral in the right knee. These were recent x-rays from a few months ago.   Assessment & Plan  Primary osteoarthritis of right knee (M17.11) Primary osteoarthritis of left knee (M17.12)  Note:Surgical Plans: Right Total  Knee Replacement  Disposition: Home with family, Start with HHPT  PCP: Dr. Tenny Craw Cards: Dr. Gwenlyn Found - Patient has been seen preoperatively and felt to be stable for surgery.  Topical TXA  Anesthesia Issues: None  Patient was instructed on  what medications to stop prior to surgery.  Signed electronically by Joelene Millin, III PA-C

## 2017-07-24 ENCOUNTER — Inpatient Hospital Stay (HOSPITAL_COMMUNITY): Payer: PPO | Admitting: Certified Registered"

## 2017-07-24 ENCOUNTER — Encounter (HOSPITAL_COMMUNITY): Payer: Self-pay | Admitting: *Deleted

## 2017-07-24 ENCOUNTER — Inpatient Hospital Stay (HOSPITAL_COMMUNITY)
Admission: RE | Admit: 2017-07-24 | Discharge: 2017-07-26 | DRG: 470 | Disposition: A | Discharge status: Home Health | Payer: PPO | Source: Ambulatory Visit | Attending: Orthopedic Surgery | Admitting: Orthopedic Surgery

## 2017-07-24 ENCOUNTER — Encounter (HOSPITAL_COMMUNITY)
Admission: RE | Disposition: A | Discharge status: Home Health | Payer: Self-pay | Source: Ambulatory Visit | Attending: Orthopedic Surgery

## 2017-07-24 DIAGNOSIS — E785 Hyperlipidemia, unspecified: Secondary | ICD-10-CM | POA: Diagnosis present

## 2017-07-24 DIAGNOSIS — Z90710 Acquired absence of both cervix and uterus: Secondary | ICD-10-CM

## 2017-07-24 DIAGNOSIS — M1711 Unilateral primary osteoarthritis, right knee: Secondary | ICD-10-CM | POA: Diagnosis not present

## 2017-07-24 DIAGNOSIS — E039 Hypothyroidism, unspecified: Secondary | ICD-10-CM | POA: Diagnosis present

## 2017-07-24 DIAGNOSIS — Z888 Allergy status to other drugs, medicaments and biological substances status: Secondary | ICD-10-CM

## 2017-07-24 DIAGNOSIS — Z87891 Personal history of nicotine dependence: Secondary | ICD-10-CM | POA: Diagnosis not present

## 2017-07-24 DIAGNOSIS — I10 Essential (primary) hypertension: Secondary | ICD-10-CM | POA: Diagnosis not present

## 2017-07-24 DIAGNOSIS — Z79891 Long term (current) use of opiate analgesic: Secondary | ICD-10-CM | POA: Diagnosis not present

## 2017-07-24 DIAGNOSIS — M25761 Osteophyte, right knee: Secondary | ICD-10-CM | POA: Diagnosis present

## 2017-07-24 DIAGNOSIS — K219 Gastro-esophageal reflux disease without esophagitis: Secondary | ICD-10-CM | POA: Diagnosis not present

## 2017-07-24 DIAGNOSIS — Z90722 Acquired absence of ovaries, bilateral: Secondary | ICD-10-CM

## 2017-07-24 DIAGNOSIS — Z880 Allergy status to penicillin: Secondary | ICD-10-CM | POA: Diagnosis not present

## 2017-07-24 DIAGNOSIS — Z79899 Other long term (current) drug therapy: Secondary | ICD-10-CM | POA: Diagnosis not present

## 2017-07-24 DIAGNOSIS — M170 Bilateral primary osteoarthritis of knee: Principal | ICD-10-CM | POA: Diagnosis present

## 2017-07-24 DIAGNOSIS — G8918 Other acute postprocedural pain: Secondary | ICD-10-CM | POA: Diagnosis not present

## 2017-07-24 DIAGNOSIS — M1710 Unilateral primary osteoarthritis, unspecified knee: Secondary | ICD-10-CM

## 2017-07-24 DIAGNOSIS — M7072 Other bursitis of hip, left hip: Secondary | ICD-10-CM | POA: Diagnosis present

## 2017-07-24 DIAGNOSIS — I350 Nonrheumatic aortic (valve) stenosis: Secondary | ICD-10-CM | POA: Diagnosis present

## 2017-07-24 DIAGNOSIS — M179 Osteoarthritis of knee, unspecified: Secondary | ICD-10-CM | POA: Diagnosis present

## 2017-07-24 DIAGNOSIS — Z8261 Family history of arthritis: Secondary | ICD-10-CM | POA: Diagnosis not present

## 2017-07-24 DIAGNOSIS — Z881 Allergy status to other antibiotic agents status: Secondary | ICD-10-CM

## 2017-07-24 DIAGNOSIS — M109 Gout, unspecified: Secondary | ICD-10-CM | POA: Diagnosis not present

## 2017-07-24 DIAGNOSIS — K589 Irritable bowel syndrome without diarrhea: Secondary | ICD-10-CM | POA: Diagnosis present

## 2017-07-24 DIAGNOSIS — M25561 Pain in right knee: Secondary | ICD-10-CM | POA: Diagnosis present

## 2017-07-24 DIAGNOSIS — Z8249 Family history of ischemic heart disease and other diseases of the circulatory system: Secondary | ICD-10-CM

## 2017-07-24 HISTORY — PX: TOTAL KNEE ARTHROPLASTY: SHX125

## 2017-07-24 LAB — TYPE AND SCREEN
ABO/RH(D): O POS
Antibody Screen: NEGATIVE

## 2017-07-24 SURGERY — ARTHROPLASTY, KNEE, TOTAL
Anesthesia: General | Site: Knee | Laterality: Right

## 2017-07-24 MED ORDER — PANTOPRAZOLE SODIUM 40 MG PO TBEC
40.0000 mg | DELAYED_RELEASE_TABLET | Freq: Every day | ORAL | Status: DC
  Administered 2017-07-25 – 2017-07-26 (×2): 40 mg via ORAL
  Filled 2017-07-24 (×2): qty 1

## 2017-07-24 MED ORDER — HYDRALAZINE HCL 25 MG PO TABS
25.0000 mg | ORAL_TABLET | Freq: Two times a day (BID) | ORAL | Status: DC
  Administered 2017-07-24 – 2017-07-26 (×4): 25 mg via ORAL
  Filled 2017-07-24 (×4): qty 1

## 2017-07-24 MED ORDER — METOCLOPRAMIDE HCL 5 MG/ML IJ SOLN: 10.0000 mg | Freq: Once | INTRAMUSCULAR | Status: DC | PRN

## 2017-07-24 MED ORDER — DIPHENHYDRAMINE HCL 12.5 MG/5ML PO ELIX: 12.5000 mg | ORAL_SOLUTION | ORAL | Status: DC | PRN

## 2017-07-24 MED ORDER — EPHEDRINE SULFATE-NACL 50-0.9 MG/10ML-% IV SOSY
PREFILLED_SYRINGE | INTRAVENOUS | Status: DC | PRN
  Administered 2017-07-24: 10 mg via INTRAVENOUS

## 2017-07-24 MED ORDER — HYDROCHLOROTHIAZIDE 12.5 MG PO CAPS
12.5000 mg | ORAL_CAPSULE | Freq: Every evening | ORAL | Status: DC
  Administered 2017-07-25: 18:00:00 12.5 mg via ORAL
  Filled 2017-07-24: qty 1

## 2017-07-24 MED ORDER — LIDOCAINE 2% (20 MG/ML) 5 ML SYRINGE
INTRAMUSCULAR | Status: AC
  Filled 2017-07-24: qty 5

## 2017-07-24 MED ORDER — SODIUM CHLORIDE 0.9 % IJ SOLN
INTRAMUSCULAR | Status: DC | PRN
  Administered 2017-07-24: 60 mL

## 2017-07-24 MED ORDER — LOSARTAN POTASSIUM 50 MG PO TABS
100.0000 mg | ORAL_TABLET | Freq: Every evening | ORAL | Status: DC
  Administered 2017-07-25: 100 mg via ORAL
  Filled 2017-07-24: qty 2

## 2017-07-24 MED ORDER — FENTANYL CITRATE (PF) 100 MCG/2ML IJ SOLN
INTRAMUSCULAR | Status: AC
  Filled 2017-07-24: qty 2

## 2017-07-24 MED ORDER — ONDANSETRON HCL 4 MG PO TABS: 4.0000 mg | ORAL_TABLET | Freq: Four times a day (QID) | ORAL | Status: DC | PRN

## 2017-07-24 MED ORDER — MENTHOL 3 MG MT LOZG: 1.0000 | LOZENGE | OROMUCOSAL | Status: DC | PRN

## 2017-07-24 MED ORDER — BUPIVACAINE LIPOSOME 1.3 % IJ SUSP
20.0000 mL | Freq: Once | INTRAMUSCULAR | Status: DC
  Filled 2017-07-24: qty 20

## 2017-07-24 MED ORDER — GABAPENTIN 300 MG PO CAPS
ORAL_CAPSULE | ORAL | Status: AC
  Administered 2017-07-24: 300 mg via ORAL
  Filled 2017-07-24: qty 1

## 2017-07-24 MED ORDER — DEXAMETHASONE SODIUM PHOSPHATE 10 MG/ML IJ SOLN
10.0000 mg | Freq: Once | INTRAMUSCULAR | Status: AC
  Administered 2017-07-24: 10 mg via INTRAVENOUS

## 2017-07-24 MED ORDER — MIDAZOLAM HCL 2 MG/2ML IJ SOLN
INTRAMUSCULAR | Status: AC
  Administered 2017-07-24: 2 mg via INTRAVENOUS
  Filled 2017-07-24: qty 2

## 2017-07-24 MED ORDER — OXYCODONE HCL 5 MG PO TABS
5.0000 mg | ORAL_TABLET | ORAL | Status: DC | PRN
  Administered 2017-07-24: 5 mg via ORAL
  Administered 2017-07-24 – 2017-07-26 (×10): 10 mg via ORAL
  Filled 2017-07-24 (×2): qty 2
  Filled 2017-07-24: qty 1
  Filled 2017-07-24: qty 2
  Filled 2017-07-24: qty 1
  Filled 2017-07-24 (×7): qty 2

## 2017-07-24 MED ORDER — SODIUM CHLORIDE 0.9 % IJ SOLN
INTRAMUSCULAR | Status: AC
  Filled 2017-07-24: qty 10

## 2017-07-24 MED ORDER — FENTANYL CITRATE (PF) 100 MCG/2ML IJ SOLN
INTRAMUSCULAR | Status: AC
  Administered 2017-07-24: 100 ug via INTRAVENOUS
  Filled 2017-07-24: qty 2

## 2017-07-24 MED ORDER — EPHEDRINE 5 MG/ML INJ
INTRAVENOUS | Status: AC
  Filled 2017-07-24: qty 10

## 2017-07-24 MED ORDER — FLEET ENEMA 7-19 GM/118ML RE ENEM: 1.0000 | ENEMA | Freq: Once | RECTAL | Status: DC | PRN

## 2017-07-24 MED ORDER — SODIUM CHLORIDE 0.9 % IR SOLN
Status: DC | PRN
  Administered 2017-07-24: 1000 mL

## 2017-07-24 MED ORDER — SODIUM CHLORIDE 0.9 % IJ SOLN
INTRAMUSCULAR | Status: AC
  Filled 2017-07-24: qty 50

## 2017-07-24 MED ORDER — DOCUSATE SODIUM 100 MG PO CAPS
100.0000 mg | ORAL_CAPSULE | Freq: Two times a day (BID) | ORAL | Status: DC
  Administered 2017-07-24 – 2017-07-26 (×4): 100 mg via ORAL
  Filled 2017-07-24 (×4): qty 1

## 2017-07-24 MED ORDER — CHLORHEXIDINE GLUCONATE 4 % EX LIQD: 60.0000 mL | Freq: Once | CUTANEOUS | Status: DC

## 2017-07-24 MED ORDER — ACETAMINOPHEN 325 MG PO TABS: 650.0000 mg | ORAL_TABLET | Freq: Four times a day (QID) | ORAL | Status: DC | PRN

## 2017-07-24 MED ORDER — BISACODYL 10 MG RE SUPP: 10.0000 mg | Freq: Every day | RECTAL | Status: DC | PRN

## 2017-07-24 MED ORDER — ONDANSETRON HCL 4 MG/2ML IJ SOLN: 4.0000 mg | Freq: Four times a day (QID) | INTRAMUSCULAR | Status: DC | PRN

## 2017-07-24 MED ORDER — DICYCLOMINE HCL 10 MG PO CAPS
10.0000 mg | ORAL_CAPSULE | Freq: Four times a day (QID) | ORAL | Status: DC | PRN
  Filled 2017-07-24: qty 1

## 2017-07-24 MED ORDER — MEPERIDINE HCL 50 MG/ML IJ SOLN: 6.2500 mg | INTRAMUSCULAR | Status: DC | PRN

## 2017-07-24 MED ORDER — BUPIVACAINE-EPINEPHRINE (PF) 0.5% -1:200000 IJ SOLN
INTRAMUSCULAR | Status: DC | PRN
  Administered 2017-07-24: 30 mL via PERINEURAL

## 2017-07-24 MED ORDER — ACETAMINOPHEN 10 MG/ML IV SOLN
INTRAVENOUS | Status: AC
  Filled 2017-07-24: qty 100

## 2017-07-24 MED ORDER — ALLOPURINOL 100 MG PO TABS
100.0000 mg | ORAL_TABLET | Freq: Every day | ORAL | Status: DC
  Administered 2017-07-25 – 2017-07-26 (×2): 100 mg via ORAL
  Filled 2017-07-24 (×2): qty 1

## 2017-07-24 MED ORDER — GABAPENTIN 300 MG PO CAPS
300.0000 mg | ORAL_CAPSULE | Freq: Once | ORAL | Status: AC
  Administered 2017-07-24: 300 mg via ORAL

## 2017-07-24 MED ORDER — MIDAZOLAM HCL 2 MG/2ML IJ SOLN
2.0000 mg | Freq: Once | INTRAMUSCULAR | Status: AC
  Administered 2017-07-24: 2 mg via INTRAVENOUS

## 2017-07-24 MED ORDER — RIVAROXABAN 10 MG PO TABS
10.0000 mg | ORAL_TABLET | Freq: Every day | ORAL | Status: DC
  Administered 2017-07-25 – 2017-07-26 (×2): 10 mg via ORAL
  Filled 2017-07-24 (×2): qty 1

## 2017-07-24 MED ORDER — PHENOL 1.4 % MT LIQD: 1.0000 | OROMUCOSAL | Status: DC | PRN

## 2017-07-24 MED ORDER — CILIDINIUM-CHLORDIAZEPOXIDE 2.5-5 MG PO CAPS
1.0000 | ORAL_CAPSULE | Freq: Two times a day (BID) | ORAL | Status: DC | PRN
  Filled 2017-07-24: qty 1

## 2017-07-24 MED ORDER — LACTATED RINGERS IV SOLN
INTRAVENOUS | Status: DC
  Administered 2017-07-24 (×2): via INTRAVENOUS

## 2017-07-24 MED ORDER — STERILE WATER FOR IRRIGATION IR SOLN
Status: DC | PRN
  Administered 2017-07-24: 2000 mL

## 2017-07-24 MED ORDER — HYDROMORPHONE HCL-NACL 0.5-0.9 MG/ML-% IV SOSY
PREFILLED_SYRINGE | INTRAVENOUS | Status: AC
  Filled 2017-07-24: qty 3

## 2017-07-24 MED ORDER — METHOCARBAMOL 1000 MG/10ML IJ SOLN
500.0000 mg | Freq: Four times a day (QID) | INTRAVENOUS | Status: DC | PRN
  Administered 2017-07-24: 500 mg via INTRAVENOUS
  Filled 2017-07-24: qty 550

## 2017-07-24 MED ORDER — ACETAMINOPHEN 500 MG PO TABS
1000.0000 mg | ORAL_TABLET | Freq: Four times a day (QID) | ORAL | Status: AC
  Administered 2017-07-24 – 2017-07-25 (×4): 1000 mg via ORAL
  Filled 2017-07-24 (×4): qty 2

## 2017-07-24 MED ORDER — POLYETHYLENE GLYCOL 3350 17 G PO PACK: 17.0000 g | PACK | Freq: Every day | ORAL | Status: DC | PRN

## 2017-07-24 MED ORDER — TRANEXAMIC ACID 1000 MG/10ML IV SOLN: 1000.0000 mg | Freq: Once | INTRAVENOUS | Status: DC

## 2017-07-24 MED ORDER — BUPIVACAINE LIPOSOME 1.3 % IJ SUSP
INTRAMUSCULAR | Status: DC | PRN
  Administered 2017-07-24: 20 mL

## 2017-07-24 MED ORDER — DEXAMETHASONE SODIUM PHOSPHATE 10 MG/ML IJ SOLN
10.0000 mg | Freq: Once | INTRAMUSCULAR | Status: AC
  Administered 2017-07-25: 08:00:00 10 mg via INTRAVENOUS
  Filled 2017-07-24: qty 1

## 2017-07-24 MED ORDER — AMLODIPINE BESYLATE 5 MG PO TABS
5.0000 mg | ORAL_TABLET | Freq: Every day | ORAL | Status: DC
  Administered 2017-07-24 – 2017-07-25 (×2): 5 mg via ORAL
  Filled 2017-07-24 (×2): qty 1

## 2017-07-24 MED ORDER — TRANEXAMIC ACID 1000 MG/10ML IV SOLN
INTRAVENOUS | Status: AC | PRN
  Administered 2017-07-24: 2000 mg via TOPICAL

## 2017-07-24 MED ORDER — LIDOCAINE 2% (20 MG/ML) 5 ML SYRINGE
INTRAMUSCULAR | Status: DC | PRN
  Administered 2017-07-24: 1000 mg via INTRAVENOUS

## 2017-07-24 MED ORDER — METOCLOPRAMIDE HCL 5 MG/ML IJ SOLN: 5.0000 mg | Freq: Three times a day (TID) | INTRAMUSCULAR | Status: DC | PRN

## 2017-07-24 MED ORDER — TRANEXAMIC ACID 1000 MG/10ML IV SOLN
2000.0000 mg | Freq: Once | INTRAVENOUS | Status: DC
  Filled 2017-07-24: qty 20

## 2017-07-24 MED ORDER — SODIUM CHLORIDE 0.9 % IV SOLN
INTRAVENOUS | Status: DC
  Administered 2017-07-24: 17:00:00 via INTRAVENOUS

## 2017-07-24 MED ORDER — METHOCARBAMOL 500 MG PO TABS
500.0000 mg | ORAL_TABLET | Freq: Four times a day (QID) | ORAL | Status: DC | PRN
  Administered 2017-07-25 – 2017-07-26 (×3): 500 mg via ORAL
  Filled 2017-07-24 (×3): qty 1

## 2017-07-24 MED ORDER — SUGAMMADEX SODIUM 200 MG/2ML IV SOLN
INTRAVENOUS | Status: DC | PRN
  Administered 2017-07-24: 200 mg via INTRAVENOUS

## 2017-07-24 MED ORDER — METOCLOPRAMIDE HCL 5 MG PO TABS: 5.0000 mg | ORAL_TABLET | Freq: Three times a day (TID) | ORAL | Status: DC | PRN

## 2017-07-24 MED ORDER — LOSARTAN POTASSIUM-HCTZ 100-12.5 MG PO TABS: 1.0000 | ORAL_TABLET | Freq: Every evening | ORAL | Status: DC

## 2017-07-24 MED ORDER — MIDAZOLAM HCL 2 MG/2ML IJ SOLN
INTRAMUSCULAR | Status: AC
  Filled 2017-07-24: qty 2

## 2017-07-24 MED ORDER — MORPHINE SULFATE (PF) 4 MG/ML IV SOLN: 1.0000 mg | INTRAVENOUS | Status: DC | PRN

## 2017-07-24 MED ORDER — ACETAMINOPHEN 10 MG/ML IV SOLN
1000.0000 mg | Freq: Once | INTRAVENOUS | Status: AC
  Administered 2017-07-24: 1000 mg via INTRAVENOUS

## 2017-07-24 MED ORDER — TRAMADOL HCL 50 MG PO TABS
50.0000 mg | ORAL_TABLET | Freq: Four times a day (QID) | ORAL | Status: DC | PRN
  Administered 2017-07-24: 20:00:00 100 mg via ORAL
  Filled 2017-07-24: qty 2

## 2017-07-24 MED ORDER — ACETAMINOPHEN 650 MG RE SUPP: 650.0000 mg | Freq: Four times a day (QID) | RECTAL | Status: DC | PRN

## 2017-07-24 MED ORDER — FENTANYL CITRATE (PF) 100 MCG/2ML IJ SOLN
100.0000 ug | Freq: Once | INTRAMUSCULAR | Status: AC
  Administered 2017-07-24: 100 ug via INTRAVENOUS

## 2017-07-24 MED ORDER — SUGAMMADEX SODIUM 200 MG/2ML IV SOLN
INTRAVENOUS | Status: AC
  Filled 2017-07-24: qty 2

## 2017-07-24 MED ORDER — LEVOTHYROXINE SODIUM 125 MCG PO TABS
125.0000 ug | ORAL_TABLET | Freq: Every day | ORAL | Status: DC
  Administered 2017-07-25 – 2017-07-26 (×2): 125 ug via ORAL
  Filled 2017-07-24 (×2): qty 1

## 2017-07-24 MED ORDER — CEFAZOLIN SODIUM-DEXTROSE 2-4 GM/100ML-% IV SOLN
INTRAVENOUS | Status: AC
  Filled 2017-07-24: qty 100

## 2017-07-24 MED ORDER — FENTANYL CITRATE (PF) 100 MCG/2ML IJ SOLN
INTRAMUSCULAR | Status: DC | PRN
  Administered 2017-07-24 (×2): 50 ug via INTRAVENOUS

## 2017-07-24 MED ORDER — DEXAMETHASONE SODIUM PHOSPHATE 10 MG/ML IJ SOLN
INTRAMUSCULAR | Status: AC
  Filled 2017-07-24: qty 1

## 2017-07-24 MED ORDER — 0.9 % SODIUM CHLORIDE (POUR BTL) OPTIME
TOPICAL | Status: DC | PRN
  Administered 2017-07-24: 1000 mL

## 2017-07-24 MED ORDER — PROPOFOL 10 MG/ML IV BOLUS
INTRAVENOUS | Status: DC | PRN
  Administered 2017-07-24: 160 mg via INTRAVENOUS

## 2017-07-24 MED ORDER — ONDANSETRON HCL 4 MG/2ML IJ SOLN
INTRAMUSCULAR | Status: DC | PRN
  Administered 2017-07-24: 4 mg via INTRAVENOUS

## 2017-07-24 MED ORDER — CEFAZOLIN SODIUM-DEXTROSE 2-4 GM/100ML-% IV SOLN
2.0000 g | INTRAVENOUS | Status: AC
  Administered 2017-07-24: 2 g via INTRAVENOUS

## 2017-07-24 MED ORDER — CEFAZOLIN SODIUM-DEXTROSE 2-4 GM/100ML-% IV SOLN
2.0000 g | Freq: Four times a day (QID) | INTRAVENOUS | Status: AC
  Administered 2017-07-24 – 2017-07-25 (×2): 2 g via INTRAVENOUS
  Filled 2017-07-24 (×2): qty 100

## 2017-07-24 MED ORDER — HYDROMORPHONE HCL-NACL 0.5-0.9 MG/ML-% IV SOSY
0.2500 mg | PREFILLED_SYRINGE | INTRAVENOUS | Status: DC | PRN
  Administered 2017-07-24 (×2): 0.5 mg via INTRAVENOUS

## 2017-07-24 MED ORDER — PROPOFOL 10 MG/ML IV BOLUS
INTRAVENOUS | Status: AC
  Filled 2017-07-24: qty 20

## 2017-07-24 MED ORDER — ROCURONIUM BROMIDE 10 MG/ML (PF) SYRINGE
PREFILLED_SYRINGE | INTRAVENOUS | Status: DC | PRN
  Administered 2017-07-24: 40 mg via INTRAVENOUS

## 2017-07-24 MED ORDER — FENTANYL CITRATE (PF) 100 MCG/2ML IJ SOLN: 25.0000 ug | INTRAMUSCULAR | Status: DC | PRN

## 2017-07-24 MED ORDER — VANCOMYCIN HCL IN DEXTROSE 1-5 GM/200ML-% IV SOLN: 1000.0000 mg | Freq: Two times a day (BID) | INTRAVENOUS | Status: DC

## 2017-07-24 MED ORDER — ONDANSETRON HCL 4 MG/2ML IJ SOLN
INTRAMUSCULAR | Status: AC
  Filled 2017-07-24: qty 2

## 2017-07-24 SURGICAL SUPPLY — 52 items
BAG DECANTER FOR FLEXI CONT (MISCELLANEOUS) ×3 IMPLANT
BAG ZIPLOCK 12X15 (MISCELLANEOUS) ×3 IMPLANT
BANDAGE ACE 6X5 VEL STRL LF (GAUZE/BANDAGES/DRESSINGS) ×3 IMPLANT
BLADE SAG 18X100X1.27 (BLADE) ×3 IMPLANT
BLADE SAW SGTL 11.0X1.19X90.0M (BLADE) ×3 IMPLANT
BOWL SMART MIX CTS (DISPOSABLE) ×3 IMPLANT
CAPT KNEE TOTAL 3 ATTUNE ×3 IMPLANT
CEMENT HV SMART SET (Cement) ×6 IMPLANT
CLOSURE WOUND 1/2 X4 (GAUZE/BANDAGES/DRESSINGS) ×2
COVER SURGICAL LIGHT HANDLE (MISCELLANEOUS) ×3 IMPLANT
CUFF TOURN SGL QUICK 34 (TOURNIQUET CUFF) ×2
CUFF TRNQT CYL 34X4X40X1 (TOURNIQUET CUFF) ×1 IMPLANT
DECANTER SPIKE VIAL GLASS SM (MISCELLANEOUS) ×3 IMPLANT
DRAPE U-SHAPE 47X51 STRL (DRAPES) ×3 IMPLANT
DRSG ADAPTIC 3X8 NADH LF (GAUZE/BANDAGES/DRESSINGS) ×3 IMPLANT
DURAPREP 26ML APPLICATOR (WOUND CARE) ×3 IMPLANT
ELECT REM PT RETURN 15FT ADLT (MISCELLANEOUS) ×3 IMPLANT
EVACUATOR 1/8 PVC DRAIN (DRAIN) ×3 IMPLANT
GAUZE SPONGE 4X4 12PLY STRL (GAUZE/BANDAGES/DRESSINGS) ×3 IMPLANT
GLOVE BIO SURGEON STRL SZ7 (GLOVE) ×3 IMPLANT
GLOVE BIO SURGEON STRL SZ7.5 (GLOVE) ×6 IMPLANT
GLOVE BIO SURGEON STRL SZ8 (GLOVE) ×3 IMPLANT
GLOVE BIOGEL PI IND STRL 6.5 (GLOVE) ×1 IMPLANT
GLOVE BIOGEL PI IND STRL 7.5 (GLOVE) ×4 IMPLANT
GLOVE BIOGEL PI IND STRL 8 (GLOVE) ×1 IMPLANT
GLOVE BIOGEL PI INDICATOR 6.5 (GLOVE) ×2
GLOVE BIOGEL PI INDICATOR 7.5 (GLOVE) ×8
GLOVE BIOGEL PI INDICATOR 8 (GLOVE) ×2
GLOVE SURG SS PI 7.0 STRL IVOR (GLOVE) ×3 IMPLANT
GLOVE SURG SS PI 7.5 STRL IVOR (GLOVE) ×3 IMPLANT
GOWN STRL REUS W/ TWL XL LVL3 (GOWN DISPOSABLE) ×1 IMPLANT
GOWN STRL REUS W/TWL LRG LVL3 (GOWN DISPOSABLE) ×3 IMPLANT
GOWN STRL REUS W/TWL XL LVL3 (GOWN DISPOSABLE) ×11 IMPLANT
HANDPIECE INTERPULSE COAX TIP (DISPOSABLE) ×2
IMMOBILIZER KNEE 20 (SOFTGOODS) ×3
IMMOBILIZER KNEE 20 THIGH 36 (SOFTGOODS) ×1 IMPLANT
MANIFOLD NEPTUNE II (INSTRUMENTS) ×3 IMPLANT
PACK TOTAL KNEE CUSTOM (KITS) ×3 IMPLANT
PAD ABD 8X10 STRL (GAUZE/BANDAGES/DRESSINGS) ×3 IMPLANT
PADDING CAST COTTON 6X4 STRL (CAST SUPPLIES) ×9 IMPLANT
POSITIONER SURGICAL ARM (MISCELLANEOUS) ×3 IMPLANT
SET HNDPC FAN SPRY TIP SCT (DISPOSABLE) ×1 IMPLANT
STRIP CLOSURE SKIN 1/2X4 (GAUZE/BANDAGES/DRESSINGS) ×4 IMPLANT
SUT MNCRL AB 4-0 PS2 18 (SUTURE) ×3 IMPLANT
SUT STRATAFIX 0 PDS 27 VIOLET (SUTURE) ×3
SUT VIC AB 2-0 CT1 27 (SUTURE) ×6
SUT VIC AB 2-0 CT1 TAPERPNT 27 (SUTURE) ×3 IMPLANT
SUTURE STRATFX 0 PDS 27 VIOLET (SUTURE) ×1 IMPLANT
SYR 30ML LL (SYRINGE) ×6 IMPLANT
TRAY FOLEY CATH SILVER 14FR (SET/KITS/TRAYS/PACK) ×3 IMPLANT
WRAP KNEE MAXI GEL POST OP (GAUZE/BANDAGES/DRESSINGS) ×3 IMPLANT
YANKAUER SUCT BULB TIP 10FT TU (MISCELLANEOUS) ×3 IMPLANT

## 2017-07-24 NOTE — Progress Notes (Signed)
Orthopedic Tech Progress Note Patient Details:  [name] [name] 20-Aug-1946 [id number]  CPM Right Knee CPM Right Knee: Off Right Knee Flexion (Degrees): 40 Right Knee Extension (Degrees): 10 Additional Comments: Trapeze bar   [name] 07/24/2017, 8:01 PM

## 2017-07-24 NOTE — Anesthesia Postprocedure Evaluation (Signed)
Anesthesia Post Note  Patient: [name] [name]  Procedure(s) Performed: Procedure(s) (LRB): RIGHT TOTAL KNEE ARTHROPLASTY (Right)     Patient location during evaluation: PACU Anesthesia Type: General Level of consciousness: awake and alert Pain management: pain level controlled Vital Signs Assessment: post-procedure vital signs reviewed and stable Respiratory status: spontaneous breathing, nonlabored ventilation, respiratory function stable and patient connected to nasal cannula oxygen Cardiovascular status: blood pressure returned to baseline and stable Postop Assessment: no signs of nausea or vomiting Anesthetic complications: no    Last Vitals:  Vitals:   07/24/17 1600 07/24/17 1620  BP: (!) 155/88 (!) 162/86  Pulse: 72 74  Resp: 11 12  Temp: (!) 36.4 C (!) 36.4 C    Last Pain:  Vitals:   07/24/17 1724  TempSrc:   PainSc: 7                  Montez Hageman

## 2017-07-24 NOTE — Interval H&P Note (Signed)
History and Physical Interval Note:  07/24/2017 11:53 AM  [name] [name]  has presented today for surgery, with the diagnosis of Osteoarthritis Right Knee   The various methods of treatment have been discussed with the patient and family. After consideration of risks, benefits and other options for treatment, the patient has consented to  Procedure(s): RIGHT TOTAL KNEE ARTHROPLASTY (Right) as a surgical intervention .  The patient's history has been reviewed, patient examined, no change in status, stable for surgery.  I have reviewed the patient's chart and labs.  Questions were answered to the patient's satisfaction.     Gearlean Alf

## 2017-07-24 NOTE — Op Note (Signed)
OPERATIVE REPORT-TOTAL KNEE ARTHROPLASTY   Pre-operative diagnosis- Osteoarthritis  Right knee(s)  Post-operative diagnosis- Osteoarthritis Right knee(s)  Procedure-  Right  Total Knee Arthroplasty  Surgeon- Dione Plover. Mame Twombly, MD  Assistant- Arlee Muslim, PA-C   Anesthesia-  General and adductor canal block  EBL-* No blood loss amount entered *   Drains Hemovac  Tourniquet time- 38 minutes @ 440 mm HG  Complications- None  Condition-PACU - hemodynamically stable.   Brief Clinical Note  [name] [name] is a 71 y.o. year old female with end stage OA of her right knee with progressively worsening pain and dysfunction. She has constant pain, with activity and at rest and significant functional deficits with difficulties even with ADLs. She has had extensive non-op management including analgesics, injections of cortisone and viscosupplements, and home exercise program, but remains in significant pain with significant dysfunction.Radiographs show bone on bone arthritis lateral and patellofemoral. She presents now for right Total Knee Arthroplasty.    Procedure in detail---   The patient is brought into the operating room and positioned supine on the operating table. After successful administration of  General and adductor canal block,   a tourniquet is placed high on the  Right thigh(s) and the lower extremity is prepped and draped in the usual sterile fashion. Time out is performed by the operating team and then the  Right lower extremity is wrapped in Esmarch, knee flexed and the tourniquet inflated to 300 mmHg.       A midline incision is made with a ten blade through the subcutaneous tissue to the level of the extensor mechanism. A fresh blade is used to make a medial parapatellar arthrotomy. Soft tissue over the proximal medial tibia is subperiosteally elevated to the joint line with a knife and into the semimembranosus bursa with a Cobb elevator. Soft tissue over the proximal lateral  tibia is elevated with attention being paid to avoiding the patellar tendon on the tibial tubercle. The patella is everted, knee flexed 90 degrees and the ACL and PCL are removed. Findings are bone on bone lateral and patellofemoral with large global osteophytes.        The drill is used to create a starting hole in the distal femur and the canal is thoroughly irrigated with sterile saline to remove the fatty contents. The 5 degree Right  valgus alignment guide is placed into the femoral canal and the distal femoral cutting block is pinned to remove 9 mm off the distal femur. Resection is made with an oscillating saw.      The tibia is subluxed forward and the menisci are removed. The extramedullary alignment guide is placed referencing proximally at the medial aspect of the tibial tubercle and distally along the second metatarsal axis and tibial crest. The block is pinned to remove 79mm off the more deficient lateral  side. Resection is made with an oscillating saw. Size 6is the most appropriate size for the tibia and the proximal tibia is prepared with the modular drill and keel punch for that size.      The femoral sizing guide is placed and size 6 is most appropriate. Rotation is marked off the epicondylar axis and confirmed by creating a rectangular flexion gap at 90 degrees. The size 6 cutting block is pinned in this rotation and the anterior, posterior and chamfer cuts are made with the oscillating saw. The intercondylar block is then placed and that cut is made.      Trial size 6 tibial component,  trial size 6 narrow posterior stabilized femur and a 6  mm posterior stabilized rotating platform insert trial is placed. Full extension is achieved with excellent varus/valgus and anterior/posterior balance throughout full range of motion. The patella is everted and thickness measured to be 22  mm. Free hand resection is taken to 12 mm, a 38 template is placed, lug holes are drilled, trial patella is placed,  and it tracks normally. Osteophytes are removed off the posterior femur with the trial in place. All trials are removed and the cut bone surfaces prepared with pulsatile lavage. Cement is mixed and once ready for implantation, the size 6 tibial implant, size  6 narrow posterior stabilized femoral component, and the size 38 patella are cemented in place and the patella is held with the clamp. The trial insert is placed and the knee held in full extension. The Exparel (20 ml mixed with 60 ml saline) is injected into the extensor mechanism, posterior capsule, medial and lateral gutters and subcutaneous tissues.  All extruded cement is removed and once the cement is hard the permanent 6 mm posterior stabilized rotating platform insert is placed into the tibial tray.      The wound is copiously irrigated with saline solution and the extensor mechanism closed over a hemovac drain with #1 V-loc suture. The tourniquet is released for a total tourniquet time of 38  minutes. Flexion against gravity is 140 degrees and the patella tracks normally. Subcutaneous tissue is closed with 2.0 vicryl and subcuticular with running 4.0 Monocryl. The incision is cleaned and dried and steri-strips and a bulky sterile dressing are applied. The limb is placed into a knee immobilizer and the patient is awakened and transported to recovery in stable condition.      Please note that a surgical assistant was a medical necessity for this procedure in order to perform it in a safe and expeditious manner. Surgical assistant was necessary to retract the ligaments and vital neurovascular structures to prevent injury to them and also necessary for proper positioning of the limb to allow for anatomic placement of the prosthesis.   Dione Plover Bowen Kia, MD    07/24/2017, 2:01 PM

## 2017-07-24 NOTE — Anesthesia Procedure Notes (Signed)
Procedure Name: Intubation Date/Time: 07/24/2017 1:03 PM Performed by: Noralyn Pick D Pre-anesthesia Checklist: Patient identified, Emergency Drugs available, Suction available and Patient being monitored Patient Re-evaluated:Patient Re-evaluated prior to induction Oxygen Delivery Method: Circle system utilized Preoxygenation: Pre-oxygenation with 100% oxygen Induction Type: IV induction Ventilation: Mask ventilation without difficulty Laryngoscope Size: Mac and 3 Grade View: Grade II Tube type: Oral Tube size: 7.5 mm Number of attempts: 1 Airway Equipment and Method: Stylet Placement Confirmation: ETT inserted through vocal cords under direct vision,  positive ETCO2 and breath sounds checked- equal and bilateral Secured at: 21 cm Tube secured with: Tape Dental Injury: Teeth and Oropharynx as per pre-operative assessment

## 2017-07-24 NOTE — Progress Notes (Signed)
AssistedDr. Carignan with right, ultrasound guided, adductor canal block. Side rails up, monitors on throughout procedure. See vital signs in flow sheet. Tolerated Procedure well.  

## 2017-07-24 NOTE — Discharge Instructions (Addendum)
°  ° °Dr. Frank Aluisio °Total Joint Specialist °[facility] °[address] °, [place] [address] °[phone] ° °TOTAL KNEE REPLACEMENT POSTOPERATIVE DIRECTIONS ° °Knee Rehabilitation, Guidelines Following Surgery  °Results after knee surgery are often greatly improved when you follow the exercise, range of motion and muscle strengthening exercises prescribed by your doctor. Safety measures are also important to protect the knee from further injury. Any time any of these exercises cause you to have increased pain or swelling in your knee joint, decrease the amount until you are comfortable again and slowly increase them. If you have problems or questions, call your caregiver or physical therapist for advice.  ° °HOME CARE INSTRUCTIONS  °Remove items at home which could result in a fall. This includes throw rugs or furniture in walking pathways.  °· ICE to the affected knee every three hours for 30 minutes at a time and then as needed for pain and swelling.  Continue to use ice on the knee for pain and swelling from surgery. You may notice swelling that will progress down to the foot and ankle.  This is normal after surgery.  Elevate the leg when you are not up walking on it.   °· Continue to use the breathing machine which will help keep your temperature down.  It is common for your temperature to cycle up and down following surgery, especially at night when you are not up moving around and exerting yourself.  The breathing machine keeps your lungs expanded and your temperature down. °· Do not place pillow under knee, focus on keeping the knee straight while resting ° °DIET °You may resume your previous home diet once your are discharged from the hospital. ° °DRESSING / WOUND CARE / SHOWERING °You may shower 3 days after surgery, but keep the wounds dry during showering.  You may use an occlusive plastic wrap (Press'n Seal for example), NO SOAKING/SUBMERGING IN THE BATHTUB.  If the  bandage gets wet, change with a clean dry gauze.  If the incision gets wet, pat the wound dry with a clean towel. °You may start showering once you are discharged home but do not submerge the incision under water. Just pat the incision dry and apply a dry gauze dressing on daily. °Change the surgical dressing daily and reapply a dry dressing each time. ° °ACTIVITY °Walk with your walker as instructed. °Use walker as long as suggested by your caregivers. °Avoid periods of inactivity such as sitting longer than an hour when not asleep. This helps prevent blood clots.  °You may resume a sexual relationship in one month or when given the OK by your doctor.  °You may return to work once you are cleared by your doctor.  °Do not drive a car for 6 weeks or until released by you surgeon.  °Do not drive while taking narcotics. ° °WEIGHT BEARING °Weight bearing as tolerated with assist device (walker, cane, etc) as directed, use it as long as suggested by your surgeon or therapist, typically at least 4-6 weeks. ° °POSTOPERATIVE CONSTIPATION PROTOCOL °Constipation - defined medically as fewer than three stools per week and severe constipation as less than one stool per week. ° °One of the most common issues patients have following surgery is constipation.  Even if you have a regular bowel pattern at home, your normal regimen is likely to be disrupted due to multiple reasons following surgery.  Combination of anesthesia, postoperative narcotics, change in appetite and fluid intake all can affect your   bowels.  In order to avoid complications following surgery, here are some recommendations in order to help you during your recovery period. ° °Colace (docusate) - Pick up an over-the-counter form of Colace or another stool softener and take twice a day as long as you are requiring postoperative pain medications.  Take with a full glass of water daily.  If you experience loose stools or diarrhea, hold the colace until you stool forms  back up.  If your symptoms do not get better within 1 week or if they get worse, check with your doctor. ° °Dulcolax (bisacodyl) - Pick up over-the-counter and take as directed by the product packaging as needed to assist with the movement of your bowels.  Take with a full glass of water.  Use this product as needed if not relieved by Colace only.  ° °MiraLax (polyethylene glycol) - Pick up over-the-counter to have on hand.  MiraLax is a solution that will increase the amount of water in your bowels to assist with bowel movements.  Take as directed and can mix with a glass of water, juice, soda, coffee, or tea.  Take if you go more than two days without a movement. °Do not use MiraLax more than once per day. Call your doctor if you are still constipated or irregular after using this medication for 7 days in a row. ° °If you continue to have problems with postoperative constipation, please contact the office for further assistance and recommendations.  If you experience "the worst abdominal pain ever" or develop nausea or vomiting, please contact the office immediatly for further recommendations for treatment. ° °ITCHING ° If you experience itching with your medications, try taking only a single pain pill, or even half a pain pill at a time.  You can also use Benadryl over the counter for itching or also to help with sleep.  ° °TED HOSE STOCKINGS °Wear the elastic stockings on both legs for three weeks following surgery during the day but you may remove then at night for sleeping. ° °MEDICATIONS °See your medication summary on the “After Visit Summary” that the nursing staff will review with you prior to discharge.  You may have some home medications which will be placed on hold until you complete the course of blood thinner medication.  It is important for you to complete the blood thinner medication as prescribed by your surgeon.  Continue your approved medications as instructed at time of  discharge. ° °PRECAUTIONS °If you experience chest pain or shortness of breath - call 911 immediately for transfer to the hospital emergency department.  °If you develop a fever greater that 101 F, purulent drainage from wound, increased redness or drainage from wound, foul odor from the wound/dressing, or calf pain - CONTACT YOUR SURGEON.   °                                                °FOLLOW-UP APPOINTMENTS °Make sure you keep all of your appointments after your operation with your surgeon and caregivers. You should call the office at the above phone number and make an appointment for approximately two weeks after the date of your surgery or on the date instructed by your surgeon outlined in the "After Visit Summary". ° ° °RANGE OF MOTION AND STRENGTHENING EXERCISES  °Rehabilitation of the knee is important following a knee   injury or an operation. After just a few days of immobilization, the muscles of the thigh which control the knee become weakened and shrink (atrophy). Knee exercises are designed to build up the tone and strength of the thigh muscles and to improve knee motion. Often times heat used for twenty to thirty minutes before working out will loosen up your tissues and help with improving the range of motion but do not use heat for the first two weeks following surgery. These exercises can be done on a training (exercise) mat, on the floor, on a table or on a bed. Use what ever works the best and is most comfortable for you Knee exercises include:  °Leg Lifts - While your knee is still immobilized in a splint or cast, you can do straight leg raises. Lift the leg to 60 degrees, hold for 3 sec, and slowly lower the leg. Repeat 10-20 times 2-3 times daily. Perform this exercise against resistance later as your knee gets better.  °Quad and Hamstring Sets - Tighten up the muscle on the front of the thigh (Quad) and hold for 5-10 sec. Repeat this 10-20 times hourly. Hamstring sets are done by pushing the  foot backward against an object and holding for 5-10 sec. Repeat as with quad sets.  °· Leg Slides: Lying on your back, slowly slide your foot toward your buttocks, bending your knee up off the floor (only go as far as is comfortable). Then slowly slide your foot back down until your leg is flat on the floor again. °· Angel Wings: Lying on your back spread your legs to the side as far apart as you can without causing discomfort.  °A rehabilitation program following serious knee injuries can speed recovery and prevent re-injury in the future due to weakened muscles. Contact your doctor or a physical therapist for more information on knee rehabilitation.  ° °IF YOU ARE TRANSFERRED TO A SKILLED REHAB FACILITY °If the patient is transferred to a skilled rehab facility following release from the hospital, a list of the current medications will be sent to the facility for the patient to continue.  When discharged from the skilled rehab facility, please have the facility set up the patient's Home Health Physical Therapy prior to being released. Also, the skilled facility will be responsible for providing the patient with their medications at time of release from the facility to include their pain medication, the muscle relaxants, and their blood thinner medication. If the patient is still at the rehab facility at time of the two week follow up appointment, the skilled rehab facility will also need to assist the patient in arranging follow up appointment in our office and any transportation needs. ° °MAKE SURE YOU:  °Understand these instructions.  °Get help right away if you are not doing well or get worse.  ° ° °Pick up stool softner and laxative for home use following surgery while on pain medications. °Do not submerge incision under water. °Please use good hand washing techniques while changing dressing each day. °May shower starting three days after surgery. °Please use a clean towel to pat the incision dry following  showers. °Continue to use ice for pain and swelling after surgery. °Do not use any lotions or creams on the incision until instructed by your surgeon. ° °Take Xarelto for two and a half more weeks following discharge from the hospital, then discontinue Xarelto. °Once the patient has completed the blood thinner regimen, then take a Baby 81 mg Aspirin daily   for three more weeks. ° ° °Information on my medicine - XARELTO® (Rivaroxaban) ° ° °Why was Xarelto® prescribed for you? °Xarelto® was prescribed for you to reduce the risk of blood clots forming after orthopedic surgery. The medical term for these abnormal blood clots is venous thromboembolism (VTE). ° °What do you need to know about xarelto® ? °Take your Xarelto® ONCE DAILY at the same time every day. °You may take it either with or without food. ° °If you have difficulty swallowing the tablet whole, you may crush it and mix in applesauce just prior to taking your dose. ° °Take Xarelto® exactly as prescribed by your doctor and DO NOT stop taking Xarelto® without talking to the doctor who prescribed the medication.  Stopping without other VTE prevention medication to take the place of Xarelto® may increase your risk of developing a clot. ° °After discharge, you should have regular check-up appointments with your healthcare provider that is prescribing your Xarelto®.   ° °What do you do if you miss a dose? °If you miss a dose, take it as soon as you remember on the same day then continue your regularly scheduled once daily regimen the next day. Do not take two doses of Xarelto® on the same day.  ° °Important Safety Information °A possible side effect of Xarelto® is bleeding. You should call your healthcare provider right away if you experience any of the following: °? Bleeding from an injury or your nose that does not stop. °? Unusual colored urine (red or dark De[place]ley) or unusual colored stools (red or black). °? Unusual bruising for unknown reasons. °? A serious  fall or if you hit your head (even if there is no bleeding). ° °Some medicines may interact with Xarelto® and might increase your risk of bleeding while on Xarelto®. To help avoid this, consult your healthcare provider or pharmacist prior to using any new prescription or non-prescription medications, including herbals, vitamins, non-steroidal anti-inflammatory drugs (NSAIDs) and supplements. ° °This website has more information on Xarelto®: www.xarelto.com. ° ° °

## 2017-07-24 NOTE — Anesthesia Preprocedure Evaluation (Signed)
Anesthesia Evaluation  Patient identified by MRN, date of birth, ID band Patient awake    Reviewed: Allergy & Precautions, NPO status , Patient's Chart, lab work & pertinent test results  Airway Mallampati: II  TM Distance: >3 FB Neck ROM: Full    Dental no notable dental hx.    Pulmonary former smoker,    Pulmonary exam normal breath sounds clear to auscultation       Cardiovascular hypertension, Pt. on medications Normal cardiovascular exam+ Valvular Problems/Murmurs (Moderate AS on ECHO 1/19) AS  Rhythm:Regular Rate:Normal     Neuro/Psych negative neurological ROS  negative psych ROS   GI/Hepatic negative GI ROS, Neg liver ROS,   Endo/Other  Hypothyroidism   Renal/GU negative Renal ROS  negative genitourinary   Musculoskeletal negative musculoskeletal ROS (+)   Abdominal   Peds negative pediatric ROS (+)  Hematology negative hematology ROS (+)   Anesthesia Other Findings   Reproductive/Obstetrics negative OB ROS                            Anesthesia Physical Anesthesia Plan  ASA: II  Anesthesia Plan: General   Post-op Pain Management: GA combined w/ Regional for post-op pain   Induction: Intravenous  PONV Risk Score and Plan: 3 and Ondansetron, Dexamethasone and Midazolam  Airway Management Planned: Oral ETT  Additional Equipment:   Intra-op Plan:   Post-operative Plan: Extubation in OR  Informed Consent: I have reviewed the patients History and Physical, chart, labs and discussed the procedure including the risks, benefits and alternatives for the proposed anesthesia with the patient or authorized representative who has indicated his/her understanding and acceptance.   Dental advisory given  Plan Discussed with: CRNA  Anesthesia Plan Comments: (AS  GA plus Adductor)        Anesthesia Quick Evaluation

## 2017-07-24 NOTE — H&P (View-Only) (Signed)
[name] [name] DOB: 1946/03/30 Married / Language: English / Race: White Female Date of Admission:  07/24/2017 CC:  Right Knee Pain History of Present Illness The patient is a 71 year old female who comes in  for a preoperative History and Physical. The patient is scheduled for a right total knee arthroplasty to be performed by Dr. Dione Plover. Aluisio, MD at [facility] on 07/24/2017. The patient is a 71 year old female who presented for follow up of their knee. The patient is being followed for their right knee pain and osteoarthritis. They are now months out from cortisone injection and months s/p Euflexxa series. Symptoms reported include: pain, aching, stiffness and difficulty ambulating (especially with stairs). The patient feels that they are doing poorly. The following medication has been used for pain control: antiinflammatory medication (ibuprofen). The patient has reported some temporay improvement of their symptoms with Cortisone injections, helped about a month, and viscosupplementation only a little loinger than the cortisone injection. Unfortunately, the injections are no longer beneficial. She is hurting at all times. It is limiting what she can and cannot do. She does state her knee has essentially taken over her life. She is ready to proceed with surgery at this time. They have been treated conservatively in the past for the above stated problem and despite conservative measures, they continue to have progressive pain and severe functional limitations and dysfunction. They have failed non-operative management including home exercise, medications, and injections. It is felt that they would benefit from undergoing total joint replacement. Risks and benefits of the procedure have been discussed with the patient and they elect to proceed with surgery. There are no active contraindications to surgery such as ongoing infection or rapidly progressive neurological disease.   Problem  List/Past Medical Contusion of knee, right (S80.01XA)  Acute pain of left knee (M25.562)  Bursitis of left hip (M70.72)  Chronic left-sided low back pain without sciatica (M54.5)  Primary osteoarthritis of right knee (M17.11)  Pain in joint involving right ankle and foot (M25.571)  Acute pain of right shoulder (M25.511)  Diverticulitis Of Colon  Gastroesophageal Reflux Disease  High blood pressure  Hypothyroidism  Irritable bowel syndrome  Kidney Stone  Osteoarthritis  Skin Cancer  Shingles  Heart murmur  Hemorrhoids  Urinary Tract Infection  Gout  Possible TIA per MRI Scan    Allergies  Amoxicillin *PENICILLINS*  Rash. Plavix *HEMATOLOGICAL AGENTS - MISC.*  questionable rash Macrobid *URINARY ANTI-INFECTIVES*  Rash.  Family History Cerebrovascular Accident  Mother. mother Congestive Heart Failure  mother Heart Disease  mother Heart disease in female family member before age 65  Hypertension  mother Osteoarthritis  father Father  Deceased. age 63 Mother  Deceased. age 96  Social History  Pain Contract  no Number of flights of stairs before winded  1 Marital status  married Tobacco / smoke exposure  no Previously in rehab  no Living situation  live with spouse Current work status  retired Children  3 Alcohol use  never consumed alcohol Illicit drug use  no Exercise  Exercises weekly; does running / walking Drug/Alcohol Rehab (Currently)  no Tobacco use  Former smoker. quit in 1989 former smoker; smoke(d) 1/2 pack per day [place] With Family. Advance Directives  Living Will.  Medication History  Trimethoprim (100MG  Tablet, Oral) Active. Allopurinol (100MG  Tablet, Oral) Active. Losartan Potassium-HCTZ (100-12.5MG  Tablet, Oral) Active. HydrALAZINE HCl (25MG  Tablet, Oral) Active. Levothyroxine Sodium (125MCG Tablet, Oral) Active. AmLODIPine Besylate (5MG  Tablet, Oral)  Active. TraMADol HCl  (50MG  Tablet, Oral) Active. Prevacid 24HR (Oral) Specific strength unknown - Active. CeleBREX (200MG  Capsule, Oral) Active. Chlordiazepoxide-Clidinium (5-2.5MG  Capsule, Oral) Active. Vitamin D3 (2000UNIT Tablet, Oral) Active.  Past Surgical History Hysterectomy  Date: 46. complete (non-cancerous) Other Surgery  Date: 1989. PROLAPSE BLADDER REPAIR Oophorectomy  Date: 2000.    Review of Systems  General Not Present- Chills, Fatigue, Fever, Memory Loss, Night Sweats, Weight Gain and Weight Loss. Skin Not Present- Eczema, Hives, Itching, Lesions and Rash. HEENT Not Present- Dentures, Double Vision, Headache, Hearing Loss, Tinnitus and Visual Loss. Respiratory Present- Shortness of breath with exertion. Not Present- Allergies, Chronic Cough, Coughing up blood and Shortness of breath at rest. Cardiovascular Present- Difficulty Breathing On Exertion and Hypertension. Not Present- Chest Pain, Difficulty Breathing Lying Down, Murmur, Palpitations, Racing/skipping heartbeats and Swelling. Gastrointestinal Not Present- Abdominal Pain, Bloody Stool, Constipation, Diarrhea, Difficulty Swallowing, Heartburn, Jaundice, Loss of appetitie, Nausea and Vomiting. Female Genitourinary Present- Urinating at Night. Not Present- Blood in Urine, Discharge, Flank Pain, Incontinence, Painful Urination, Urgency, Urinary frequency, Urinary Retention and Weak urinary stream. Musculoskeletal Present- Back Pain, Joint Stiffness and Morning Stiffness. Not Present- Joint Pain, Joint Swelling, Muscle Pain, Muscle Weakness and Spasms. Neurological Not Present- Blackout spells, Difficulty with balance, Dizziness, Paralysis, Tremor and Weakness. Psychiatric Not Present- Insomnia.  Vitals Weight: 218 lb Height: 65in Weight was reported by patient. Height was reported by patient. Body Surface Area: 2.05 m Body Mass Index: 36.28 kg/m  Pulse: 76 (Regular)  BP: 158/98 (Sitting, Left Arm,  Standard)   Physical Exam General Mental Status -Alert, cooperative and good historian. General Appearance-pleasant, Not in acute distress. Orientation-Oriented X3. Build & Nutrition-Well nourished and Well developed.  Head and Neck Head-normocephalic, atraumatic . Neck Global Assessment - supple, no bruit auscultated on the right, no bruit auscultated on the left.  Eye Pupil - Bilateral-Regular and Round. Motion - Bilateral-EOMI.  Chest and Lung Exam Auscultation Breath sounds - clear at anterior chest wall and clear at posterior chest wall. Adventitious sounds - No Adventitious sounds.  Cardiovascular Auscultation Rhythm - Regular rate and rhythm. Heart Sounds - S1 WNL and S2 WNL. Murmurs & Other Heart Sounds: Murmur 1 - Location - Aortic Area, Left Carotid, Pulmonic Area, Right Carotid and Sternal Border - Left. Timing - Mid-systolic. Grade - III/VI. Character - Crescendo/Decrescendo.  Abdomen Palpation/Percussion Tenderness - Abdomen is non-tender to palpation. Rigidity (guarding) - Abdomen is soft. Auscultation Auscultation of the abdomen reveals - Bowel sounds normal.  Female Genitourinary Note: Not done, not pertinent to present illness   Musculoskeletal Note: Her right knee shows no effusion. There is marked crepitus on range of motion of the knee. Range is about 5 to 115. She is very tender along the medial joint line. There is some lateral tenderness also. There is no instability. Left knee about 0 to 130. Some crepitus on range of motion. Slight tenderness medial, no lateral tenderness, no instability.  RADIOGRAPHS Radiographs reviewed, AP and lateral of the knee show that she has bone-on-bone arthritis medial and patellofemoral in the right knee. These were recent x-rays from a few months ago.   Assessment & Plan  Primary osteoarthritis of right knee (M17.11) Primary osteoarthritis of left knee (M17.12)  Note:Surgical Plans: Right Total  Knee Replacement  Disposition: Home with family, Start with HHPT  PCP: Dr. Tenny Craw Cards: Dr. Gwenlyn Found - Patient has been seen preoperatively and felt to be stable for surgery.  Topical TXA  Anesthesia Issues: None  Patient was instructed on  what medications to stop prior to surgery.  Signed electronically by Joelene Millin, III PA-C

## 2017-07-24 NOTE — Progress Notes (Signed)
Orthopedic Tech Progress Note Patient Details:  [name] [name] 1946-04-05 [id number] Trapeze bar     [name] 07/24/2017, 5:10 PM

## 2017-07-24 NOTE — Anesthesia Procedure Notes (Signed)
Anesthesia Regional Block: Adductor canal block   Pre-Anesthetic Checklist: ,, timeout performed, Correct Patient, Correct Site, Correct Laterality, Correct Procedure, Correct Position, site marked, Risks and benefits discussed,  Surgical consent,  Pre-op evaluation,  At surgeon's request and post-op pain management  Laterality: Right and Lower  Prep: Maximum Sterile Barrier Precautions used, chloraprep       Needles:  Injection technique: Single-shot  Needle Type: Echogenic Stimulator Needle     Needle Length: 10cm      Additional Needles:   Procedures: ultrasound guided,,,,,,,,  Narrative:  Start time: 07/24/2017 12:38 PM End time: 07/24/2017 12:48 PM Injection made incrementally with aspirations every 5 mL.  Performed by: Personally  Anesthesiologist: Montez Hageman  Additional Notes: Risks, benefits and alternative to block explained extensively.  Patient tolerated procedure well, without complications.

## 2017-07-24 NOTE — Transfer of Care (Signed)
Immediate Anesthesia Transfer of Care Note  Patient: [name] [name]  Procedure(s) Performed: Procedure(s) with comments: RIGHT TOTAL KNEE ARTHROPLASTY (Right) - Adductor Block  Patient Location: PACU  Anesthesia Type:General and Regional  Level of Consciousness: awake, alert  and oriented  Airway & Oxygen Therapy: Patient Spontanous Breathing and Patient connected to nasal cannula oxygen  Post-op Assessment: Report given to RN and Post -op Vital signs reviewed and stable  Post vital signs: Reviewed and stable  Last Vitals:  Vitals:   07/24/17 1245 07/24/17 1246  BP:    Pulse: 76 72  Resp: 13 12    Last Pain:  Vitals:   07/24/17 1051  PainSc: 5          Complications: No apparent anesthesia complications

## 2017-07-24 NOTE — Progress Notes (Signed)
Orthopedic Tech Progress Note Patient Details:  [name] [name] [birth year] [id number]  CPM Right Knee CPM Right Knee: On Right Knee Flexion (Degrees): 40 Right Knee Extension (Degrees): 10   [name] 07/24/2017, 4:30 PM

## 2017-07-25 ENCOUNTER — Encounter (HOSPITAL_COMMUNITY): Payer: Self-pay | Admitting: Orthopedic Surgery

## 2017-07-25 LAB — CBC
HCT: 35.6 % — ABNORMAL LOW (ref 36.0–46.0)
Hemoglobin: 11.4 g/dL — ABNORMAL LOW (ref 12.0–15.0)
MCH: 27.5 pg (ref 26.0–34.0)
MCHC: 32 g/dL (ref 30.0–36.0)
MCV: 85.8 fL (ref 78.0–100.0)
PLATELETS: 265 10*3/uL (ref 150–400)
RBC: 4.15 MIL/uL (ref 3.87–5.11)
RDW: 14.9 % (ref 11.5–15.5)
WBC: 13.5 10*3/uL — AB (ref 4.0–10.5)

## 2017-07-25 LAB — BASIC METABOLIC PANEL
ANION GAP: 10 (ref 5–15)
BUN: 10 mg/dL (ref 6–20)
CALCIUM: 9.2 mg/dL (ref 8.9–10.3)
CO2: 26 mmol/L (ref 22–32)
Chloride: 102 mmol/L (ref 101–111)
Creatinine, Ser: 0.76 mg/dL (ref 0.44–1.00)
Glucose, Bld: 175 mg/dL — ABNORMAL HIGH (ref 65–99)
POTASSIUM: 4.4 mmol/L (ref 3.5–5.1)
SODIUM: 138 mmol/L (ref 135–145)

## 2017-07-25 MED ORDER — RIVAROXABAN 10 MG PO TABS: 10.0000 mg | ORAL_TABLET | Freq: Every day | ORAL | 0 refills | Status: DC

## 2017-07-25 MED ORDER — METHOCARBAMOL 500 MG PO TABS: 500.0000 mg | ORAL_TABLET | Freq: Four times a day (QID) | ORAL | 0 refills | Status: DC | PRN

## 2017-07-25 MED ORDER — OXYCODONE HCL 5 MG PO TABS: 5.0000 mg | ORAL_TABLET | ORAL | 0 refills | Status: DC | PRN

## 2017-07-25 MED ORDER — TRAMADOL HCL 50 MG PO TABS: 50.0000 mg | ORAL_TABLET | Freq: Four times a day (QID) | ORAL | 0 refills | Status: DC | PRN

## 2017-07-25 NOTE — Discharge Summary (Signed)
Physician Discharge Summary   Patient ID: [name] [name] MRN: [id number] DOB/AGE: 1946-06-26 71 y.o.  Admit date: 07/24/2017 Discharge date: 07/26/2017  Primary Diagnosis:  Osteoarthritis  Right knee(s)  Admission Diagnoses:  Past Medical History:  Diagnosis Date  . Aortic valve stenosis    moderate; visualized on ECHO 01-03-17   . Carotid artery bruit   . Family history of heart disease   . GERD (gastroesophageal reflux disease)   . Gout   . Heart murmur   . History of kidney stones   . Hyperlipemia   . Hypertension   . Kidney stones   . Thyroid disease   . UTI (urinary tract infection) 03/09/14   Discharge Diagnoses:   Principal Problem:   OA (osteoarthritis) of knee  Estimated body mass index is 37.44 kg/m as calculated from the following:   Height as of this encounter: 5' 5" (1.651 m).   Weight as of this encounter: 102.1 kg (225 lb).  Procedure:  Procedure(s) (LRB): RIGHT TOTAL KNEE ARTHROPLASTY (Right)   Consults: None  HPI: [name] [name] is a 71 y.o. year old female with end stage OA of her right knee with progressively worsening pain and dysfunction. She has constant pain, with activity and at rest and significant functional deficits with difficulties even with ADLs. She has had extensive non-op management including analgesics, injections of cortisone and viscosupplements, and home exercise program, but remains in significant pain with significant dysfunction.Radiographs show bone on bone arthritis lateral and patellofemoral. She presents now for right Total Knee Arthroplasty.    Laboratory Data: Admission on 07/24/2017  Component Date Value Ref Range Status  . WBC 07/25/2017 13.5* 4.0 - 10.5 K/uL Final  . RBC 07/25/2017 4.15  3.87 - 5.11 MIL/uL Final  . Hemoglobin 07/25/2017 11.4* 12.0 - 15.0 g/dL Final  . HCT 07/25/2017 35.6* 36.0 - 46.0 % Final  . MCV 07/25/2017 85.8  78.0 - 100.0 fL Final  . MCH 07/25/2017 27.5  26.0 - 34.0 pg Final  . MCHC 07/25/2017 32.0   30.0 - 36.0 g/dL Final  . RDW 07/25/2017 14.9  11.5 - 15.5 % Final  . Platelets 07/25/2017 265  150 - 400 K/uL Final  . Sodium 07/25/2017 138  135 - 145 mmol/L Final  . Potassium 07/25/2017 4.4  3.5 - 5.1 mmol/L Final  . Chloride 07/25/2017 102  101 - 111 mmol/L Final  . CO2 07/25/2017 26  22 - 32 mmol/L Final  . Glucose, Bld 07/25/2017 175* 65 - 99 mg/dL Final  . BUN 07/25/2017 10  6 - 20 mg/dL Final  . Creatinine, Ser 07/25/2017 0.76  0.44 - 1.00 mg/dL Final  . Calcium 07/25/2017 9.2  8.9 - 10.3 mg/dL Final  . GFR calc non Af Amer 07/25/2017 >60  >60 mL/min Final  . GFR calc Af Amer 07/25/2017 >60  >60 mL/min Final   Comment: (NOTE) The eGFR has been calculated using the CKD EPI equation. This calculation has not been validated in all clinical situations. eGFR's persistently <60 mL/min signify possible Chronic Kidney Disease.   Georgiann Hahn gap 07/25/2017 10  5 - 15 Final  Hospital Outpatient Visit on 07/17/2017  Component Date Value Ref Range Status  . aPTT 07/17/2017 27  24 - 36 seconds Final  . WBC 07/17/2017 7.8  4.0 - 10.5 K/uL Final  . RBC 07/17/2017 4.31  3.87 - 5.11 MIL/uL Final  . Hemoglobin 07/17/2017 11.9* 12.0 - 15.0 g/dL Final  . HCT 07/17/2017 37.2  36.0 -  46.0 % Final  . MCV 07/17/2017 86.3  78.0 - 100.0 fL Final  . MCH 07/17/2017 27.6  26.0 - 34.0 pg Final  . MCHC 07/17/2017 32.0  30.0 - 36.0 g/dL Final  . RDW 07/17/2017 14.9  11.5 - 15.5 % Final  . Platelets 07/17/2017 292  150 - 400 K/uL Final  . Sodium 07/17/2017 140  135 - 145 mmol/L Final  . Potassium 07/17/2017 3.5  3.5 - 5.1 mmol/L Final  . Chloride 07/17/2017 102  101 - 111 mmol/L Final  . CO2 07/17/2017 31  22 - 32 mmol/L Final  . Glucose, Bld 07/17/2017 118* 65 - 99 mg/dL Final  . BUN 07/17/2017 13  6 - 20 mg/dL Final  . Creatinine, Ser 07/17/2017 0.70  0.44 - 1.00 mg/dL Final  . Calcium 07/17/2017 9.1  8.9 - 10.3 mg/dL Final  . Total Protein 07/17/2017 7.1  6.5 - 8.1 g/dL Final  . Albumin 07/17/2017  3.8  3.5 - 5.0 g/dL Final  . AST 07/17/2017 21  15 - 41 U/L Final  . ALT 07/17/2017 20  14 - 54 U/L Final  . Alkaline Phosphatase 07/17/2017 87  38 - 126 U/L Final  . Total Bilirubin 07/17/2017 0.4  0.3 - 1.2 mg/dL Final  . GFR calc non Af Amer 07/17/2017 >60  >60 mL/min Final  . GFR calc Af Amer 07/17/2017 >60  >60 mL/min Final   Comment: (NOTE) The eGFR has been calculated using the CKD EPI equation. This calculation has not been validated in all clinical situations. eGFR's persistently <60 mL/min signify possible Chronic Kidney Disease.   . Anion gap 07/17/2017 7  5 - 15 Final  . Prothrombin Time 07/17/2017 12.5  11.4 - 15.2 seconds Final  . INR 07/17/2017 0.93   Final  . ABO/RH(D) 07/17/2017 O POS   Final  . Antibody Screen 07/17/2017 NEG   Final  . Sample Expiration 07/17/2017 07/27/2017   Final  . Extend sample reason 07/17/2017 NO TRANSFUSIONS OR PREGNANCY IN THE PAST 3 MONTHS   Final  . MRSA, PCR 07/17/2017 NEGATIVE  NEGATIVE Final  . Staphylococcus aureus 07/17/2017 NEGATIVE  NEGATIVE Final   Comment:        The Xpert SA Assay (FDA approved for NASAL specimens in patients over 61 years of age), is one component of a comprehensive surveillance program.  Test performance has been validated by [facility] for patients greater than or equal to 19 year old. It is not intended to diagnose infection nor to guide or monitor treatment.   . ABO/RH(D) 07/17/2017 O POS   Final     X-Rays:No results found.  EKG: Orders placed or performed in visit on 01/03/17  . EKG 12-Lead     Hospital Course: [name] [name] is a 71 y.o. who was admitted to [facility]. They were brought to the operating room on 07/24/2017 and underwent Procedure(s): RIGHT TOTAL KNEE ARTHROPLASTY.  Patient tolerated the procedure well and was later transferred to the recovery room and then to the orthopaedic floor for postoperative care.  They were given PO and IV analgesics for pain control  following their surgery.  They were given 24 hours of postoperative antibiotics of  Anti-infectives    Start     Dose/Rate Route Frequency Ordered Stop   07/25/17 0100  vancomycin (VANCOCIN) IVPB 1000 mg/200 mL premix  Status:  Discontinued     1,000 mg 200 mL/hr over 60 Minutes Intravenous Every 12 hours 07/24/17 1629 07/24/17  1705   07/24/17 1900  ceFAZolin (ANCEF) IVPB 2g/100 mL premix     2 g 200 mL/hr over 30 Minutes Intravenous Every 6 hours 07/24/17 1705 07/25/17 0213   07/24/17 1025  ceFAZolin (ANCEF) 2-4 GM/100ML-% IVPB    Comments:  Bridget Hartshorn   : cabinet override      07/24/17 1025 07/24/17 1305   07/24/17 1015  ceFAZolin (ANCEF) IVPB 2g/100 mL premix     2 g 200 mL/hr over 30 Minutes Intravenous On call to O.R. 07/24/17 1015 07/24/17 1305     and started on DVT prophylaxis in the form of Xarelto.   PT and OT were ordered for total joint protocol.  Discharge planning consulted to help with postop disposition and equipment needs.  Patient had a decent night on the evening of surgery.  They started to get up OOB with therapy on day one. Hemovac drain was pulled without difficulty.  Continued to work with therapy into day two.  Dressing was changed on day two and the incision was healing well.   Patient was seen in rounds on day two and was ready to go home.  Diet - Cardiac diet Follow up - in 2 weeks Activity - WBAT Disposition - Home Condition Upon Discharge - Good D/C Meds - See DC Summary DVT Prophylaxis - Xarelto  Discharge Instructions    Call MD / Call 911    Complete by:  As directed    If you experience chest pain or shortness of breath, CALL 911 and be transported to the hospital emergency room.  If you develope a fever above 101 F, pus (white drainage) or increased drainage or redness at the wound, or calf pain, call your surgeon's office.   Change dressing    Complete by:  As directed    Change dressing daily with sterile 4 x 4 inch gauze dressing and apply  TED hose. Do not submerge the incision under water.   Constipation Prevention    Complete by:  As directed    Drink plenty of fluids.  Prune juice may be helpful.  You may use a stool softener, such as Colace (over the counter) 100 mg twice a day.  Use MiraLax (over the counter) for constipation as needed.   Diet - low sodium heart healthy    Complete by:  As directed    Discharge instructions    Complete by:  As directed    Take Xarelto for two and a half more weeks, then discontinue Xarelto. Once the patient has completed the blood thinner regimen, then take a Baby 81 mg Aspirin daily for three more weeks.   Pick up stool softner and laxative for home use following surgery while on pain medications. Do not submerge incision under water. Please use good hand washing techniques while changing dressing each day. May shower starting three days after surgery. Please use a clean towel to pat the incision dry following showers. Continue to use ice for pain and swelling after surgery. Do not use any lotions or creams on the incision until instructed by your surgeon.  Wear both TED hose on both legs during the day every day for three weeks, but may remove the TED hose at night at home.  Postoperative Constipation Protocol  Constipation - defined medically as fewer than three stools per week and severe constipation as less than one stool per week.  One of the most common issues patients have following surgery is constipation.  Even if you  have a regular bowel pattern at home, your normal regimen is likely to be disrupted due to multiple reasons following surgery.  Combination of anesthesia, postoperative narcotics, change in appetite and fluid intake all can affect your bowels.  In order to avoid complications following surgery, here are some recommendations in order to help you during your recovery period.  Colace (docusate) - Pick up an over-the-counter form of Colace or another stool softener  and take twice a day as long as you are requiring postoperative pain medications.  Take with a full glass of water daily.  If you experience loose stools or diarrhea, hold the colace until you stool forms back up.  If your symptoms do not get better within 1 week or if they get worse, check with your doctor.  Dulcolax (bisacodyl) - Pick up over-the-counter and take as directed by the product packaging as needed to assist with the movement of your bowels.  Take with a full glass of water.  Use this product as needed if not relieved by Colace only.   MiraLax (polyethylene glycol) - Pick up over-the-counter to have on hand.  MiraLax is a solution that will increase the amount of water in your bowels to assist with bowel movements.  Take as directed and can mix with a glass of water, juice, soda, coffee, or tea.  Take if you go more than two days without a movement. Do not use MiraLax more than once per day. Call your doctor if you are still constipated or irregular after using this medication for 7 days in a row.  If you continue to have problems with postoperative constipation, please contact the office for further assistance and recommendations.  If you experience "the worst abdominal pain ever" or develop nausea or vomiting, please contact the office immediatly for further recommendations for treatment.   Do not put a pillow under the knee. Place it under the heel.    Complete by:  As directed    Do not sit on low chairs, stoools or toilet seats, as it may be difficult to get up from low surfaces    Complete by:  As directed    Driving restrictions    Complete by:  As directed    No driving until released by the physician.   Increase activity slowly as tolerated    Complete by:  As directed    Lifting restrictions    Complete by:  As directed    No lifting until released by the physician.   Patient may shower    Complete by:  As directed    You may shower without a dressing once there is no  drainage.  Do not wash over the wound.  If drainage remains, do not shower until drainage stops.   TED hose    Complete by:  As directed    Use stockings (TED hose) for 3 weeks on both leg(s).  You may remove them at night for sleeping.   Weight bearing as tolerated    Complete by:  As directed    Laterality:  right   Extremity:  Lower     Allergies as of 07/25/2017      Reactions   Amoxicillin Rash   Amoxicillin-pot Clavulanate Rash   Has patient had a PCN reaction causing immediate rash, facial/tongue/throat swelling, SOB or lightheadedness with hypotension: No Has patient had a PCN reaction causing severe rash involving mucus membranes or skin necrosis: No Has patient had a PCN reaction that required  hospitalization: No Has patient had a PCN reaction occurring within the last 10 years: No If all of the above answers are "NO", then may proceed with Cephalosporin use.   Ampicillin Rash   Clarithromycin Rash   Clopidogrel Bisulfate Rash   Nitrofurantoin Itching   Plavix [clopidogrel Bisulfate] Rash   Zestril [lisinopril] Rash      Medication List    STOP taking these medications   celecoxib 200 MG capsule Commonly known as:  CELEBREX   cyclobenzaprine 5 MG tablet Commonly known as:  FLEXERIL   PAIN RELIEVING PATCH ULTRA ST EX   Vitamin D 2000 units Caps     TAKE these medications   acetaminophen 650 MG CR tablet Commonly known as:  TYLENOL Take 1,300 mg by mouth every 8 (eight) hours as needed for pain.   allopurinol 100 MG tablet Commonly known as:  ZYLOPRIM Take 100 mg by mouth daily.   amLODipine 10 MG tablet Commonly known as:  NORVASC Take 5 mg by mouth at bedtime.   clidinium-chlordiazePOXIDE 5-2.5 MG capsule Commonly known as:  LIBRAX Take 1 capsule by mouth 2 (two) times daily as needed.   dicyclomine 10 MG capsule Commonly known as:  BENTYL Take 10 mg by mouth 4 (four) times daily as needed for spasms.   hydrALAZINE 25 MG tablet Commonly known  as:  APRESOLINE Take 25 mg by mouth 2 (two) times daily.   lansoprazole 30 MG capsule Commonly known as:  PREVACID Take 30 mg by mouth every other day.   levothyroxine 125 MCG tablet Commonly known as:  SYNTHROID, LEVOTHROID Take 125 mcg by mouth daily before breakfast.   losartan-hydrochlorothiazide 100-25 MG tablet Commonly known as:  HYZAAR Take 1 tablet by mouth daily. What changed:  how much to take  additional instructions   losartan-hydrochlorothiazide 100-12.5 MG tablet Commonly known as:  HYZAAR Take 1 tablet by mouth every evening. What changed:  Another medication with the same name was changed. Make sure you understand how and when to take each.   methocarbamol 500 MG tablet Commonly known as:  ROBAXIN Take 1 tablet (500 mg total) by mouth every 6 (six) hours as needed for muscle spasms.   oxyCODONE 5 MG immediate release tablet Commonly known as:  Oxy IR/ROXICODONE Take 1-2 tablets (5-10 mg total) by mouth every 4 (four) hours as needed for moderate pain or severe pain.   rivaroxaban 10 MG Tabs tablet Commonly known as:  XARELTO Take 1 tablet (10 mg total) by mouth daily with breakfast. Take Xarelto for two and a half more weeks following discharge from the hospital, then discontinue Xarelto. Once the patient has completed the blood thinner regimen, then take a Baby 81 mg Aspirin daily for three more weeks.   sulfamethoxazole-trimethoprim 400-80 MG tablet Commonly known as:  BACTRIM,SEPTRA Take 1 tablet by mouth at bedtime.   traMADol 50 MG tablet Commonly known as:  ULTRAM Take 1-2 tablets (50-100 mg total) by mouth every 6 (six) hours as needed for moderate pain. What changed:  how much to take  when to take this  reasons to take this      Follow-up Information    [facility] Follow up.   Specialty:  [place] Why:  physical therapy Contact information: [address] [place] [address] [phone]         Gaynelle Arabian, MD. Schedule an appointment as soon as possible for a visit on 08/07/2017.   Specialty:  Orthopedic Surgery  Why:  Follow up with either Amber or Drew on Monday 08/07/17. Contact information: [address] [place] [address] [phone]           Signed: Arlee Muslim, PA-C Orthopaedic Surgery 07/25/2017, 5:45 PM

## 2017-07-25 NOTE — Evaluation (Signed)
Occupational Therapy Evaluation Patient Details Name: [name] [name] MRN: [id number] DOB: 11-Dec-1946 Today's Date: 07/25/2017    History of Present Illness Pt. is a 71 y.o. female s/p R TKA   Clinical Impression  Pt is s/p TKA resulting in the deficits listed below (see OT Problem List). Pt will benefit from skilled OT to increase their safety and independence with ADL and functional mobility for ADL to facilitate discharge to venue listed below.        Follow Up Recommendations  No OT follow up          Precautions / Restrictions Precautions Precautions: Knee;Fall Required Braces or Orthoses: Knee Immobilizer - Right Knee Immobilizer - Right: Discontinue once straight leg raise with < 10 degree lag Restrictions RLE Weight Bearing: Weight bearing as tolerated      Mobility Bed Mobility Overal bed mobility: Needs Assistance Bed Mobility: Supine to Sit     Supine to sit: Min guard     General bed mobility comments: verbal cues for technique  Transfers Overall transfer level: Needs assistance Equipment used: Rolling walker (2 wheeled) Transfers: Sit to/from Stand Sit to Stand: Min assist (per PT note)         General transfer comment: verbal cues                                                ADL either performed or assessed with clinical judgement   ADL Overall ADL's : Needs assistance/impaired                                       General ADL Comments: Pt back to bed.  OT educated pt on techniques to A with ADL activity s/p TKR.  Educated on strategies for dressing, bathing and safety with transfers.  Will perform learned strategies in the monrning prion to [place] Patient Visual Report: No change from baseline              Pertinent Vitals/Pain Pain Assessment: 0-10 Pain Score: 0-No pain Pain Location: R Knee Pain Descriptors / Indicators: Aching;Sore;Discomfort Pain Intervention(s): Monitored during  session     Hand Dominance     Extremity/Trunk Assessment Upper Extremity Assessment Upper Extremity Assessment: Overall WFL for tasks assessed           Communication Communication Communication: No difficulties   Cognition Arousal/Alertness: Awake/alert Behavior During Therapy: WFL for tasks assessed/performed Overall Cognitive Status: Within Functional Limits for tasks assessed                                                Home Living Family/patient expects to be discharged to:: Private residence Living Arrangements: Alone Available Help at Discharge: Family (Daughters will be rotating shifts so she can get 24/7 support the next two weeks) Type of Home: House Home Access: Stairs to enter [employer] of Steps: 3 Entrance Stairs-Rails: Right;Left Home Layout: Two level;Able to live on main level with bedroom/bathroom     Bathroom Shower/Tub: Walk-in shower   Bathroom Toilet: Handicapped height     Home Equipment: Environmental consultant - 2 wheels;Bedside commode  Additional Comments: Pt has 3 walkers and 2 BSC, pt is prepared to live on first floor with clothes and a recliner       Prior Functioning/Environment Level of Independence: Independent                 OT Problem List: Decreased strength;Decreased safety awareness;Decreased knowledge of use of DME or AE      OT Treatment/Interventions: Self-care/ADL training;Patient/family education    OT Goals(Current goals can be found in the care plan section) Acute Rehab OT Goals Patient Stated Goal: Pt would like to return home  OT Goal Formulation: With patient Time For Goal Achievement: 07/25/17 Potential to Achieve Goals: Good ADL Goals Pt Will Perform Lower Body Dressing: with modified independence;sit to/from stand Pt Will Transfer to Toilet: with modified independence Pt Will Perform Toileting - Clothing Manipulation and hygiene: with modified independence;sit to/from stand Pt  Will Perform Tub/Shower Transfer: with modified independence;rolling walker;Shower transfer  OT Frequency: Min 2X/week   Barriers to D/C:               AM-PAC PT "6 Clicks" Daily Activity     Outcome Measure Help from another person eating meals?: None Help from another person taking care of personal grooming?: None Help from another person toileting, which includes using toliet, bedpan, or urinal?: A Little Help from another person bathing (including washing, rinsing, drying)?: A Little Help from another person to put on and taking off regular upper body clothing?: A Little Help from another person to put on and taking off regular lower body clothing?: A Little 6 Click Score: 20   End of Session Equipment Utilized During Treatment: Rolling walker Nurse Communication: Mobility status  Activity Tolerance: Patient tolerated treatment well Patient left: in chair;with call bell/phone within reach  OT Visit Diagnosis: Muscle weakness (generalized) (M62.81)                Time: 7654-6503 OT Time Calculation (min): 19 min Charges:  OT General Charges $OT Visit: 1 Procedure OT Evaluation $OT Eval Low Complexity: 1 Procedure G-Codes:     Kari Baars, [place]  [name] D 07/25/2017, 4:19 PM

## 2017-07-25 NOTE — Progress Notes (Signed)
Orthopedic Tech Progress Note Patient Details:  [name] [name] 1946-10-14 [id number]  CPM Right Knee CPM Right Knee: Off Right Knee Flexion (Degrees): 40 Right Knee Extension (Degrees): 10 Additional Comments: Trapeze bar   [name] 07/25/2017, 8:14 PM

## 2017-07-25 NOTE — Progress Notes (Signed)
Orthopedic Tech Progress Note Patient Details:  [name] [name] 02/13/1946 [id number]  CPM Right Knee CPM Right Knee: Off Right Knee Flexion (Degrees): 40 Right Knee Extension (Degrees): 10 Additional Comments: Trapeze bar   [name] 07/25/2017, 4:55 PM

## 2017-07-25 NOTE — Progress Notes (Signed)
   Subjective: 1 Day Post-Op Procedure(s) (LRB): RIGHT TOTAL KNEE ARTHROPLASTY (Right) Patient reports pain as mild.   Patient seen in rounds for Dr. Wynelle Link. Patient is well, but has had some minor complaints of pain in the knee, requiring pain medications We will start therapy today.  Plan is to go Home after hospital stay.  Objective: Vital signs in last 24 hours: Temp:  [97.2 F (36.2 C)-98.4 F (36.9 C)] 97.9 F (36.6 C) (07/31 0900) Pulse Rate:  [69-98] 88 (07/31 0900) Resp:  [11-18] 16 (07/31 0900) BP: (133-170)/(67-89) 133/73 (07/31 0900) SpO2:  [91 %-100 %] 92 % (07/31 0900) Weight:  [102.1 kg (225 lb)] 102.1 kg (225 lb) (07/30 1620)  Intake/Output from previous day:  Intake/Output Summary (Last 24 hours) at 07/25/17 1345 Last data filed at 07/25/17 0918  Gross per 24 hour  Intake           2757.5 ml  Output             1975 ml  Net            782.5 ml    Intake/Output this shift: Total I/O In: 360 [P.O.:360] Out: -   Labs:  Recent Labs  07/25/17 0541  HGB 11.4*    Recent Labs  07/25/17 0541  WBC 13.5*  RBC 4.15  HCT 35.6*  PLT 265    Recent Labs  07/25/17 0541  NA 138  K 4.4  CL 102  CO2 26  BUN 10  CREATININE 0.76  GLUCOSE 175*  CALCIUM 9.2   No results for input(s): LABPT, INR in the last 72 hours.  EXAM General - Patient is Alert, Appropriate and Oriented Extremity - Neurovascular intact Sensation intact distally Intact pulses distally Dorsiflexion/Plantar flexion intact Dressing - dressing C/D/I Motor Function - intact, moving foot and toes well on exam.  Hemovac pulled without difficulty.  Past Medical History:  Diagnosis Date  . Aortic valve stenosis    moderate; visualized on ECHO 01-03-17   . Carotid artery bruit   . Family history of heart disease   . GERD (gastroesophageal reflux disease)   . Gout   . Heart murmur   . History of kidney stones   . Hyperlipemia   . Hypertension   . Kidney stones   . Thyroid  disease   . UTI (urinary tract infection) 03/09/14    Assessment/Plan: 1 Day Post-Op Procedure(s) (LRB): RIGHT TOTAL KNEE ARTHROPLASTY (Right) Principal Problem:   OA (osteoarthritis) of knee  Estimated body mass index is 37.44 kg/m as calculated from the following:   Height as of this encounter: 5\' 5"  (1.651 m).   Weight as of this encounter: 102.1 kg (225 lb). Plan for discharge tomorrow Discharge home with home health  DVT Prophylaxis - Xarelto Weight-Bearing as tolerated to right leg D/C O2 and Pulse OX and try on Room Air  Arlee Muslim, PA-C Orthopaedic Surgery 07/25/2017, 1:45 PM

## 2017-07-25 NOTE — Progress Notes (Signed)
Physical Therapy Treatment Patient Details Name: [name] [name] MRN: [id number] DOB: 09/05/1946 Today's Date: 07/25/2017    History of Present Illness Pt. is a 71 y.o. female s/p R TKA    PT Comments    Pt assisted with ambulating in hallway and would like to practice stairs prior to d/c home.   Follow Up Recommendations  DC plan and follow up therapy as arranged by surgeon;Home health PT     Equipment Recommendations  None recommended by PT    Recommendations for Other Services       Precautions / Restrictions Precautions Precautions: Knee;Fall Required Braces or Orthoses: Knee Immobilizer - Right Knee Immobilizer - Right: Discontinue once straight leg raise with < 10 degree lag Restrictions RLE Weight Bearing: Weight bearing as tolerated    Mobility  Bed Mobility Overal bed mobility: Needs Assistance Bed Mobility: Supine to Sit     Supine to sit: Min guard     General bed mobility comments: verbal cues for technique  Transfers Overall transfer level: Needs assistance Equipment used: Rolling walker (2 wheeled) Transfers: Sit to/from Stand Sit to Stand: Min guard         General transfer comment: verbal cues for UE and LE positioning  Ambulation/Gait Ambulation/Gait assistance: Min guard Ambulation Distance (Feet): 80 Feet Assistive device: Rolling walker (2 wheeled) Gait Pattern/deviations: Step-through pattern;Decreased stride length;Antalgic     General Gait Details: verbal cues for RW positioning and posture   Stairs            Wheelchair Mobility    Modified Rankin (Stroke Patients Only)       Balance                                            Cognition Arousal/Alertness: Awake/alert Behavior During Therapy: WFL for tasks assessed/performed Overall Cognitive Status: Within Functional Limits for tasks assessed                                        Exercises      General Comments         Pertinent Vitals/Pain Pain Assessment: 0-10 Pain Score: 3  Pain Location: R Knee Pain Descriptors / Indicators: Aching;Sore;Discomfort Pain Intervention(s): Monitored during session;Repositioned;Limited activity within patient's tolerance    Home Living                      Prior Function            PT Goals (current goals can now be found in the care plan section) Progress towards PT goals: Progressing toward goals    Frequency    7X/week      PT Plan Current plan remains appropriate    Co-evaluation              AM-PAC PT "6 Clicks" Daily Activity  Outcome Measure  Difficulty turning over in bed (including adjusting bedclothes, sheets and blankets)?: A Little Difficulty moving from lying on back to sitting on the side of the bed? : A Little Difficulty sitting down on and standing up from a chair with arms (e.g., wheelchair, bedside commode, etc,.)?: A Little Help needed moving to and from a bed to chair (including a wheelchair)?: A Little Help needed walking in hospital room?: A  Little Help needed climbing 3-5 steps with a railing? : A Little 6 Click Score: 18    End of Session Equipment Utilized During Treatment: Right knee immobilizer Activity Tolerance: Patient tolerated treatment well Patient left: Other (comment);with nursing/sitter in room (in bathroom with NT)   PT Visit Diagnosis: Difficulty in walking, not elsewhere classified (R26.2)     Time: 4144-3601 PT Time Calculation (min) (ACUTE ONLY): 17 min  Charges:  $Gait Training: 8-22 mins                    G Codes:      [name], PT, DPT 07/25/2017 Pager: [phone]    York Ram E 07/25/2017, 3:36 PM

## 2017-07-25 NOTE — Progress Notes (Signed)
Discharge planning, spoke with patient at bedside. No preference for [facility] PT. Contacted [facility] for referral. They have accepted referral. Has RW and 3n1. [phone]

## 2017-07-25 NOTE — Progress Notes (Signed)
Physical Therapy Evaluation Patient Details Name: [name] [name] MRN: [id number] DOB: 1946/11/15 Today's Date: 07/25/2017   History of Present Illness  Pt. is a 71 y.o. female s/p R TKA  Clinical Impression  Pt is s/p surgery resulting in functional limitations due to the deficits listed below. Pt will benefit from skilled PT to increase their independence and safety with mobility to allow discharge. Pt responded well to treatment and was receptive to learning exercises. Pt reports being prepared at home with the necessary precautions and familial support upon discharge.      Follow Up Recommendations DC plan and follow up therapy as arranged by surgeon;Home health PT    Equipment Recommendations  None recommended by PT    Recommendations for Other Services       Precautions / Restrictions Precautions Precautions: Knee;Fall Required Braces or Orthoses: Knee Immobilizer - Right Knee Immobilizer - Right: Discontinue once straight leg raise with < 10 degree lag Restrictions Weight Bearing Restrictions: No RLE Weight Bearing: Weight bearing as tolerated      Mobility  Bed Mobility Overal bed mobility: Needs Assistance Bed Mobility: Supine to Sit     Supine to sit: Min guard     General bed mobility comments: Min guard provided for safety, cues provided for LE management, Pt reports feeling dizzy upon sitting at EOB but states it is likely the medications she has taken  Transfers Overall transfer level: Needs assistance Equipment used: Rolling walker (2 wheeled) Transfers: Sit to/from Stand Sit to Stand: Min assist         General transfer comment: Pt provided cues for hand placement on walker, min assist provided to steady   Ambulation/Gait Ambulation/Gait assistance: Min guard Ambulation Distance (Feet): 50 Feet Assistive device: Rolling walker (2 wheeled) Gait Pattern/deviations: Step-through pattern;Decreased stride length;Trunk flexed;Antalgic Gait velocity:  decr   General Gait Details: Pt reports feeling a little light headed but it subsided during treatment, pt reports little to no pain increase upon ambulating   Stairs            Wheelchair Mobility    Modified Rankin (Stroke Patients Only)       Balance Overall balance assessment: Needs assistance   Sitting balance-Leahy Scale: Fair     Standing balance support: Bilateral upper extremity supported Standing balance-Leahy Scale: Poor Standing balance comment: Pt requires RW for bilat UE support while standing                              Pertinent Vitals/Pain Pain Assessment: 0-10 Pain Score: 2  Pain Location: R Knee Pain Descriptors / Indicators: Aching;Sore;Grimacing;Discomfort Pain Intervention(s): Limited activity within patient's tolerance;Repositioned;Ice applied;Monitored during session;Patient requesting pain meds-RN notified;Premedicated before session    Home Living Family/patient expects to be discharged to:: Private residence Living Arrangements: Alone Available Help at Discharge: Family (Daughters will be rotating shifts so she can get 24/7 support the next two weeks) Type of Home: House Home Access: Stairs to enter Entrance Stairs-Rails: Psychiatric nurse of Steps: 3 Home Layout: Two level;Able to live on main level with bedroom/bathroom Home Equipment: Gilford Rile - 2 wheels;Bedside commode Additional Comments: Pt has 3 walkers and 2 BSC, pt is prepared to live on first floor with clothes and a recliner     Prior Function Level of Independence: Independent               Hand Dominance        Extremity/Trunk  Assessment   Upper Extremity Assessment Upper Extremity Assessment: Overall WFL for tasks assessed    Lower Extremity Assessment Lower Extremity Assessment: RLE deficits/detail RLE Deficits / Details: R LE s/p TKA, AAROM approx 45 degrees of flexion       Communication   Communication: No difficulties   Cognition Arousal/Alertness: Awake/alert Behavior During Therapy: WFL for tasks assessed/performed Overall Cognitive Status: Within Functional Limits for tasks assessed                                        General Comments      Exercises Total Joint Exercises Ankle Circles/Pumps: 20 reps;AROM;Seated;Both Quad Sets: 10 reps;AROM;Seated;Both Towel Squeeze: AROM;10 reps;Both;Seated Knee Flexion: Right;AAROM;Supine;10 reps Goniometric ROM: R knee Flexion estimated 40 degrees AAROM   Assessment/Plan    PT Assessment Patient needs continued PT services  PT Problem List Decreased strength;Decreased mobility;Decreased safety awareness;Decreased range of motion;Decreased coordination;Decreased activity tolerance;Decreased balance;Decreased knowledge of use of DME;Decreased knowledge of precautions;Pain       PT Treatment Interventions DME instruction;Therapeutic activities;Gait training;Therapeutic exercise;Patient/family education;Stair training;Balance training;Functional mobility training    PT Goals (Current goals can be found in the Care Plan section)  Acute Rehab PT Goals Patient Stated Goal: Pt would like to return home  PT Goal Formulation: With patient/family Time For Goal Achievement: 08/08/17 Potential to Achieve Goals: Good    Frequency 7X/week   Barriers to discharge        Co-evaluation               AM-PAC PT "6 Clicks" Daily Activity  Outcome Measure Difficulty turning over in bed (including adjusting bedclothes, sheets and blankets)?: A Lot Difficulty moving from lying on back to sitting on the side of the bed? : A Lot Difficulty sitting down on and standing up from a chair with arms (e.g., wheelchair, bedside commode, etc,.)?: Total Help needed moving to and from a bed to chair (including a wheelchair)?: A Little Help needed walking in hospital room?: A Little Help needed climbing 3-5 steps with a railing? : A Lot 6 Click Score:  13    End of Session Equipment Utilized During Treatment: Gait belt;Right knee immobilizer Activity Tolerance: Patient tolerated treatment well Patient left: in chair;with family/visitor present;with call bell/phone within reach Nurse Communication: Patient requests pain meds PT Visit Diagnosis: Difficulty in walking, not elsewhere classified (R26.2)    Time: 4097-3532 PT Time Calculation (min) (ACUTE ONLY): 38 min   Charges:   PT Evaluation $PT Eval Low Complexity: 1 Low PT Treatments $Therapeutic Exercise: 8-22 mins   PT G Codes:        Olegario Shearer, SPT  Reino Bellis 07/25/2017, 11:57 AM

## 2017-07-26 LAB — CBC
HEMATOCRIT: 35.7 % — AB (ref 36.0–46.0)
HEMOGLOBIN: 11.4 g/dL — AB (ref 12.0–15.0)
MCH: 27.5 pg (ref 26.0–34.0)
MCHC: 31.9 g/dL (ref 30.0–36.0)
MCV: 86.2 fL (ref 78.0–100.0)
Platelets: 271 10*3/uL (ref 150–400)
RBC: 4.14 MIL/uL (ref 3.87–5.11)
RDW: 15.2 % (ref 11.5–15.5)
WBC: 16.1 10*3/uL — AB (ref 4.0–10.5)

## 2017-07-26 LAB — BASIC METABOLIC PANEL
ANION GAP: 10 (ref 5–15)
BUN: 14 mg/dL (ref 6–20)
CALCIUM: 9.3 mg/dL (ref 8.9–10.3)
CHLORIDE: 101 mmol/L (ref 101–111)
CO2: 30 mmol/L (ref 22–32)
Creatinine, Ser: 0.71 mg/dL (ref 0.44–1.00)
GFR calc Af Amer: >60 mL/min (ref >60)
GFR calc non Af Amer: >60 mL/min (ref >60)
GLUCOSE: 119 mg/dL — AB (ref 65–99)
POTASSIUM: 3.6 mmol/L (ref 3.5–5.1)
Sodium: 141 mmol/L (ref 135–145)

## 2017-07-26 NOTE — Progress Notes (Signed)
Occupational Therapy Treatment Patient Details Name: [name] [name] MRN: [id number] DOB: 07-23-1946 Today's Date: 07/26/2017    History of present illness Pt. is a 71 y.o. female s/p R TKA   OT comments  Daughter will A as needed  Follow Up Recommendations  No OT follow up    Equipment Recommendations  None recommended by OT;Other (comment) (pt has a 3 n 1)    Recommendations for Other Services      Precautions / Restrictions Precautions Precautions: Knee;Fall Restrictions Weight Bearing Restrictions: No RLE Weight Bearing: Weight bearing as tolerated       Mobility Bed Mobility               General bed mobility comments: pt in chair  Transfers Overall transfer level: Needs assistance Equipment used: Rolling walker (2 wheeled) Transfers: Sit to/from [facility] Sit to Stand: Min guard Stand pivot transfers: Min guard       General transfer comment: verbal cues for hand and feet placement        ADL either performed or assessed with clinical judgement   ADL Overall ADL's : Needs assistance/impaired                 Upper Body Dressing : Set up;Sitting   Lower Body Dressing: Minimal assistance;Cueing for safety;Cueing for sequencing;With caregiver independent assisting;Sit to/from stand   Toilet Transfer: Min guard;Comfort height toilet;Ambulation;RW   Toileting- Clothing Manipulation and Hygiene: Min guard;Cueing for safety;Cueing for sequencing;With caregiver independent assisting     Tub/Shower Transfer Details (indicate cue type and reason): verbalized safety of shower transfer.  Pt declined practice stating she was not in a hurry to get in and and it was upstairs and she will be staying downstairs. Functional mobility during ADLs: Min guard General ADL Comments: daughter will a as needed     Vision Patient Visual Report: No change from baseline            Cognition Arousal/Alertness: Awake/alert Behavior During  Therapy: WFL for tasks assessed/performed Overall Cognitive Status: Within Functional Limits for tasks assessed                                                     Pertinent Vitals/ Pain       Pain Score: 4  Pain Location: R Knee Pain Descriptors / Indicators: Aching;Sore;Discomfort Pain Intervention(s): Limited activity within patient's tolerance;Monitored during session;Ice applied;Repositioned         Frequency  Min 2X/week        Progress Toward Goals  OT Goals(current goals can now be found in the care plan section)  Progress towards OT goals: Progressing toward goals     Plan Discharge plan remains appropriate       AM-PAC PT "6 Clicks" Daily Activity     Outcome Measure   Help from another person eating meals?: None Help from another person taking care of personal grooming?: None Help from another person toileting, which includes using toliet, bedpan, or urinal?: A Little Help from another person bathing (including washing, rinsing, drying)?: A Little Help from another person to put on and taking off regular upper body clothing?: A Little Help from another person to put on and taking off regular lower body clothing?: A Little 6 Click Score: 20    End of Session Equipment Utilized During  Treatment: Rolling walker  OT Visit Diagnosis: Muscle weakness (generalized) (M62.81)   Activity Tolerance Patient tolerated treatment well   Patient Left with call bell/phone within reach;in chair   Nurse Communication Mobility status        Time: 0156-1537 OT Time Calculation (min): 19 min  Charges: OT General Charges $OT Visit: 1 Procedure OT Treatments $Self Care/Home Management : 8-22 mins  [address], [place]   [name] [name] 07/26/2017, 11:47 AM

## 2017-07-26 NOTE — Progress Notes (Signed)
   Subjective: 2 Days Post-Op Procedure(s) (LRB): RIGHT TOTAL KNEE ARTHROPLASTY (Right) Patient reports pain as mild.   Patient seen in rounds with Dr. Wynelle Link. Patient is well, but has had some minor complaints of pain in the knee, requiring pain medications Patient is ready to go home  Objective: Vital signs in last 24 hours: Temp:  [97.7 F (36.5 C)-98.4 F (36.9 C)] 98.3 F (36.8 C) (08/01 0635) Pulse Rate:  [78-93] 79 (08/01 0635) Resp:  [18] 18 (08/01 0522) BP: (147-183)/(75-90) 147/75 (08/01 0934) SpO2:  [94 %-96 %] 94 % (08/01 0522)  Intake/Output from previous day:  Intake/Output Summary (Last 24 hours) at 07/26/17 1345 Last data filed at 07/26/17 1228  Gross per 24 hour  Intake              930 ml  Output             1850 ml  Net             -920 ml    Intake/Output this shift: Total I/O In: 120 [P.O.:120] Out: 700 [Urine:700]  Labs:  Recent Labs  07/25/17 0541 07/26/17 0657  HGB 11.4* 11.4*    Recent Labs  07/25/17 0541 07/26/17 0657  WBC 13.5* 16.1*  RBC 4.15 4.14  HCT 35.6* 35.7*  PLT 265 271    Recent Labs  07/25/17 0541 07/26/17 0657  NA 138 141  K 4.4 3.6  CL 102 101  CO2 26 30  BUN 10 14  CREATININE 0.76 0.71  GLUCOSE 175* 119*  CALCIUM 9.2 9.3   No results for input(s): LABPT, INR in the last 72 hours.  EXAM: General - Patient is Alert and Appropriate Extremity - Neurovascular intact Sensation intact distally Incision - clean, dry Motor Function - intact, moving foot and toes well on exam.   Assessment/Plan: 2 Days Post-Op Procedure(s) (LRB): RIGHT TOTAL KNEE ARTHROPLASTY (Right) Procedure(s) (LRB): RIGHT TOTAL KNEE ARTHROPLASTY (Right) Past Medical History:  Diagnosis Date  . Aortic valve stenosis    moderate; visualized on ECHO 01-03-17   . Carotid artery bruit   . Family history of heart disease   . GERD (gastroesophageal reflux disease)   . Gout   . Heart murmur   . History of kidney stones   . Hyperlipemia    . Hypertension   . Kidney stones   . Thyroid disease   . UTI (urinary tract infection) 03/09/14   Principal Problem:   OA (osteoarthritis) of knee  Estimated body mass index is 37.44 kg/m as calculated from the following:   Height as of this encounter: 5\' 5"  (1.651 m).   Weight as of this encounter: 102.1 kg (225 lb). Up with therapy Diet - Cardiac diet Follow up - in 2 weeks Activity - WBAT Disposition - Home Condition Upon Discharge - Good D/C Meds - See DC Summary DVT Prophylaxis - Xarelto  Arlee Muslim, PA-C Orthopaedic Surgery 07/26/2017, 1:45 PM

## 2017-07-26 NOTE — Progress Notes (Signed)
Physical Therapy Treatment Patient Details Name: [name] [name] MRN: [id number] DOB: Dec 16, 1946 Today's Date: 07/26/2017    History of Present Illness Pt. is a 71 y.o. female s/p R TKA    PT Comments    Pt tolerated treatment well and reports feeling comfortable and confident to continue at home with home health. Pt was originally apprehensive and anxious but that subsided with activity. Pt was able to perform stair mobility, ambulate, and complete exercises.    Follow Up Recommendations  DC plan and follow up therapy as arranged by surgeon;Home health PT     Equipment Recommendations  None recommended by PT    Recommendations for Other Services       Precautions / Restrictions Precautions Precautions: Knee;Fall Required Braces or Orthoses: Knee Immobilizer - Right Knee Immobilizer - Right: Discontinue once straight leg raise with < 10 degree lag Restrictions Weight Bearing Restrictions: No RLE Weight Bearing: Weight bearing as tolerated    Mobility  Bed Mobility Overal bed mobility: Needs Assistance Bed Mobility: Supine to Sit     Supine to sit: Min guard     General bed mobility comments: Min guard provided for safety, verbal cues given for LE management but pt was able to self assist to sitting   Transfers Overall transfer level: Needs assistance Equipment used: Rolling walker (2 wheeled) Transfers: Sit to/from Stand Sit to Stand: Min guard Stand pivot transfers: Min guard       General transfer comment: verbal cues for hand and feet placement, min guard for safety  Ambulation/Gait Ambulation/Gait assistance: Min guard Ambulation Distance (Feet): 100 Feet Assistive device: Rolling walker (2 wheeled) Gait Pattern/deviations: Step-through pattern;Decreased stride length;Antalgic Gait velocity: decr   General Gait Details: verbal cues for RW positioning and posture   Stairs Stairs: Yes   Stair Management: Two rails;Forwards;Step to pattern Number of  Stairs: 3 General stair comments: Pt reports being able to use railing on both sides to enter home, pt attempted stairs twice and was able to complete 3 steps each time with verbal cues and min guard for safety  Wheelchair Mobility    Modified Rankin (Stroke Patients Only)       Balance Overall balance assessment: Needs assistance   Sitting balance-Leahy Scale: Fair     Standing balance support: Bilateral upper extremity supported Standing balance-Leahy Scale: Poor Standing balance comment: Pt requires RW for bilat UE support while standing                             Cognition Arousal/Alertness: Awake/alert Behavior During Therapy: WFL for tasks assessed/performed Overall Cognitive Status: Within Functional Limits for tasks assessed                                        Exercises Total Joint Exercises Ankle Circles/Pumps: 10 reps;AROM;Seated;Both Quad Sets: 10 reps;AROM;Both;Seated Towel Squeeze: AROM;10 reps;Both;Seated Knee Flexion: Right;AAROM;Supine;10 reps Goniometric ROM: R knee flexion estimated 45 degrees AAROM    General Comments        Pertinent Vitals/Pain Pain Assessment: 0-10 Pain Score: 4  Pain Location: R Knee Pain Descriptors / Indicators: Aching;Sore;Discomfort Pain Intervention(s): Limited activity within patient's tolerance;Repositioned;Ice applied;Monitored during session;Premedicated before session    Home Living                      Prior Function  PT Goals (current goals can now be found in the care plan section) Progress towards PT goals: Progressing toward goals    Frequency    7X/week      PT Plan Current plan remains appropriate    Co-evaluation              AM-PAC PT "6 Clicks" Daily Activity  Outcome Measure  Difficulty turning over in bed (including adjusting bedclothes, sheets and blankets)?: A Little Difficulty moving from lying on back to sitting on the side  of the bed? : A Little Difficulty sitting down on and standing up from a chair with arms (e.g., wheelchair, bedside commode, etc,.)?: A Little Help needed moving to and from a bed to chair (including a wheelchair)?: A Little Help needed walking in hospital room?: A Little Help needed climbing 3-5 steps with a railing? : A Little 6 Click Score: 18    End of Session Equipment Utilized During Treatment: Right knee immobilizer Activity Tolerance: Patient tolerated treatment well Patient left: with call bell/phone within reach;with family/visitor present;in chair Nurse Communication: Mobility status PT Visit Diagnosis: Difficulty in walking, not elsewhere classified (R26.2)     Time: 2863-8177 PT Time Calculation (min) (ACUTE ONLY): 30 min  Charges:  $Gait Training: 8-22 mins $Therapeutic Exercise: 8-22 mins                    G Codes:       [name] [name], SPT   [name] [name] 07/26/2017, 1:09 PM

## 2017-07-27 DIAGNOSIS — K625 Hemorrhage of anus and rectum: Secondary | ICD-10-CM | POA: Diagnosis not present

## 2017-07-27 DIAGNOSIS — I350 Nonrheumatic aortic (valve) stenosis: Secondary | ICD-10-CM | POA: Diagnosis not present

## 2017-07-27 DIAGNOSIS — M109 Gout, unspecified: Secondary | ICD-10-CM | POA: Diagnosis not present

## 2017-07-27 DIAGNOSIS — E7800 Pure hypercholesterolemia, unspecified: Secondary | ICD-10-CM | POA: Diagnosis not present

## 2017-07-27 DIAGNOSIS — K648 Other hemorrhoids: Secondary | ICD-10-CM | POA: Diagnosis not present

## 2017-07-27 DIAGNOSIS — I10 Essential (primary) hypertension: Secondary | ICD-10-CM | POA: Diagnosis not present

## 2017-07-27 DIAGNOSIS — M7072 Other bursitis of hip, left hip: Secondary | ICD-10-CM | POA: Diagnosis not present

## 2017-07-27 DIAGNOSIS — Z79891 Long term (current) use of opiate analgesic: Secondary | ICD-10-CM | POA: Diagnosis not present

## 2017-07-27 DIAGNOSIS — K219 Gastro-esophageal reflux disease without esophagitis: Secondary | ICD-10-CM | POA: Diagnosis not present

## 2017-07-27 DIAGNOSIS — M545 Low back pain: Secondary | ICD-10-CM | POA: Diagnosis not present

## 2017-07-27 DIAGNOSIS — D126 Benign neoplasm of colon, unspecified: Secondary | ICD-10-CM | POA: Diagnosis not present

## 2017-07-27 DIAGNOSIS — Z87440 Personal history of urinary (tract) infections: Secondary | ICD-10-CM | POA: Diagnosis not present

## 2017-07-27 DIAGNOSIS — R195 Other fecal abnormalities: Secondary | ICD-10-CM | POA: Diagnosis not present

## 2017-07-27 DIAGNOSIS — Z471 Aftercare following joint replacement surgery: Secondary | ICD-10-CM | POA: Diagnosis not present

## 2017-07-27 DIAGNOSIS — Z96651 Presence of right artificial knee joint: Secondary | ICD-10-CM | POA: Diagnosis not present

## 2017-07-27 DIAGNOSIS — E039 Hypothyroidism, unspecified: Secondary | ICD-10-CM | POA: Diagnosis not present

## 2017-07-27 DIAGNOSIS — K589 Irritable bowel syndrome without diarrhea: Secondary | ICD-10-CM | POA: Diagnosis not present

## 2017-07-27 DIAGNOSIS — Z87891 Personal history of nicotine dependence: Secondary | ICD-10-CM | POA: Diagnosis not present

## 2017-07-31 DIAGNOSIS — Z96651 Presence of right artificial knee joint: Secondary | ICD-10-CM | POA: Diagnosis not present

## 2017-07-31 DIAGNOSIS — Z79891 Long term (current) use of opiate analgesic: Secondary | ICD-10-CM | POA: Diagnosis not present

## 2017-07-31 DIAGNOSIS — Z471 Aftercare following joint replacement surgery: Secondary | ICD-10-CM | POA: Diagnosis not present

## 2017-07-31 DIAGNOSIS — E039 Hypothyroidism, unspecified: Secondary | ICD-10-CM | POA: Diagnosis not present

## 2017-07-31 DIAGNOSIS — M7072 Other bursitis of hip, left hip: Secondary | ICD-10-CM | POA: Diagnosis not present

## 2017-07-31 DIAGNOSIS — E7800 Pure hypercholesterolemia, unspecified: Secondary | ICD-10-CM | POA: Diagnosis not present

## 2017-07-31 DIAGNOSIS — R195 Other fecal abnormalities: Secondary | ICD-10-CM | POA: Diagnosis not present

## 2017-07-31 DIAGNOSIS — M545 Low back pain: Secondary | ICD-10-CM | POA: Diagnosis not present

## 2017-07-31 DIAGNOSIS — I350 Nonrheumatic aortic (valve) stenosis: Secondary | ICD-10-CM | POA: Diagnosis not present

## 2017-07-31 DIAGNOSIS — K625 Hemorrhage of anus and rectum: Secondary | ICD-10-CM | POA: Diagnosis not present

## 2017-07-31 DIAGNOSIS — K589 Irritable bowel syndrome without diarrhea: Secondary | ICD-10-CM | POA: Diagnosis not present

## 2017-07-31 DIAGNOSIS — M109 Gout, unspecified: Secondary | ICD-10-CM | POA: Diagnosis not present

## 2017-07-31 DIAGNOSIS — Z87440 Personal history of urinary (tract) infections: Secondary | ICD-10-CM | POA: Diagnosis not present

## 2017-07-31 DIAGNOSIS — I10 Essential (primary) hypertension: Secondary | ICD-10-CM | POA: Diagnosis not present

## 2017-07-31 DIAGNOSIS — Z87891 Personal history of nicotine dependence: Secondary | ICD-10-CM | POA: Diagnosis not present

## 2017-07-31 DIAGNOSIS — K219 Gastro-esophageal reflux disease without esophagitis: Secondary | ICD-10-CM | POA: Diagnosis not present

## 2017-07-31 DIAGNOSIS — K648 Other hemorrhoids: Secondary | ICD-10-CM | POA: Diagnosis not present

## 2017-07-31 DIAGNOSIS — D126 Benign neoplasm of colon, unspecified: Secondary | ICD-10-CM | POA: Diagnosis not present

## 2017-08-07 ENCOUNTER — Encounter: Payer: Self-pay | Admitting: Physical Therapy

## 2017-08-07 ENCOUNTER — Ambulatory Visit: Payer: PPO | Attending: Orthopedic Surgery | Admitting: Physical Therapy

## 2017-08-07 DIAGNOSIS — M25661 Stiffness of right knee, not elsewhere classified: Secondary | ICD-10-CM | POA: Diagnosis not present

## 2017-08-07 DIAGNOSIS — R262 Difficulty in walking, not elsewhere classified: Secondary | ICD-10-CM | POA: Insufficient documentation

## 2017-08-07 DIAGNOSIS — M25561 Pain in right knee: Secondary | ICD-10-CM | POA: Insufficient documentation

## 2017-08-07 DIAGNOSIS — M6281 Muscle weakness (generalized): Secondary | ICD-10-CM | POA: Diagnosis not present

## 2017-08-07 DIAGNOSIS — R2241 Localized swelling, mass and lump, right lower limb: Secondary | ICD-10-CM | POA: Insufficient documentation

## 2017-08-07 NOTE — Therapy (Signed)
[place], [place], [address] Phone: [phone]   Fax:  [phone]  Physical Therapy Evaluation  Patient Details  Name: [name] [name] MRN: [id number] Date of Birth: 12/06/1946 Referring Provider: Gaynelle Arabian  Encounter Date: 08/07/2017      PT End of Session - 08/07/17 1628    Visit Number 1   Date for PT Re-Evaluation 09/07/17   PT Start Time 1525   PT Stop Time 1630   PT Time Calculation (min) 65 min   Activity Tolerance Patient tolerated treatment well   Behavior During Therapy [facility] for tasks assessed/performed      Past Medical History:  Diagnosis Date  . Aortic valve stenosis    moderate; visualized on ECHO 01-03-17   . Carotid artery bruit   . Family history of heart disease   . GERD (gastroesophageal reflux disease)   . Gout   . Heart murmur   . History of kidney stones   . Hyperlipemia   . Hypertension   . Kidney stones   . Thyroid disease   . UTI (urinary tract infection) 03/09/14    Past Surgical History:  Procedure Laterality Date  . BLADDER SURGERY     Lift   . lithotripsy   within last 10 years  . OOPHORECTOMY    . PARTIAL HYSTERECTOMY    . TOTAL KNEE ARTHROPLASTY Right 07/24/2017   Procedure: RIGHT TOTAL KNEE ARTHROPLASTY;  Surgeon: Gaynelle Arabian, MD;  Location: [facility];  Service: Orthopedics;  Laterality: Right;  Adductor Block    There were no vitals filed for this visit.       Subjective Assessment - 08/07/17 1530    Subjective Pt reports that she had a right total knee replacement on 07/24/17.  She had home health PT after discharge and said that it was painful but they did improve her ROM. She doesn't feel stable with the cane so she is using the rolling walker now. Reports no problems with transfers.    Limitations Walking;Standing;House hold activities   How long can you walk comfortably? no sure - hasn't tried it   Patient Stated Goals Decrease pain,  swelling, increase ROM, wants to be able to go to [employer] and walk around in October 2018.   Currently in Pain? Yes   Pain Score 4    Pain Location Knee   Pain Orientation Right   Pain Type Surgical pain   Pain Radiating Towards denies radiating pain now.    Pain Onset 1 to 4 weeks ago   Pain Frequency Several days a week   Aggravating Factors  bending - 7/10   Pain Relieving Factors medication, rest - 0/10   Effect of Pain on Daily Activities moving around            [facility] PT Assessment - 08/07/17 0001      Assessment   Medical Diagnosis R total knee replacement   Referring Provider Pilar Plate Aluisio   Onset Date/Surgical Date 07/24/17   Next MD Visit 08/08/17   Prior Therapy yes - pulled hamstring     Precautions   Precautions None     Restrictions   Weight Bearing Restrictions No     Balance Screen   Has the patient fallen in the past 6 months No   Has the patient had a decrease in activity level because of a fear of falling?  No   Is the patient reluctant to leave  their home because of a fear of falling?  No     Home Environment   Living Environment Private residence   Living Arrangements Alone   Type of [place] to enter   Entrance Stairs-Rails Left;Right   Home Layout Two level   Additional Comments Reports no problems with stairs.      Prior Function   Level of Independence Independent   Vocation Retired   Leisure Does own housework, but has someone that does the yardwork for her.      Observation/Other Assessments-Edema    Edema Circumferential  mid-patella     Circumferential Edema   Circumferential - Right 50.5  left - 45     Sensation   Additional Comments LE dermatomes intact and symmetrical     ROM / Strength   AROM / PROM / Strength AROM;PROM;Strength     AROM   AROM Assessment Site Knee   Right/Left Knee Right   Right Knee Extension 15   Right Knee Flexion 80     PROM   PROM Assessment Site Knee    Right/Left Knee Right   Right Knee Extension 10   Right Knee Flexion 85     Strength   Strength Assessment Site Knee   Right/Left Knee Right   Right Knee Flexion 4/5   Right Knee Extension 4/5     Palpation   Palpation comment Pt has a lot of swelling making the patella hard to palpate.      Transfers   Comments no problems getting in/out of car or bed.      Ambulation/Gait   Gait Comments uses rolling walker, some antalgia and decreased step length on right side.      Standardized Balance Assessment   Standardized Balance Assessment Timed Up and Go Test     Timed Up and Go Test   Normal TUG (seconds) 18            Objective measurements completed on examination: See above findings.          [facility] Adult PT Treatment/Exercise - 08/07/17 0001      Modalities   Modalities Vasopneumatic     Vasopneumatic   Number Minutes Vasopneumatic  10 minutes   Vasopnuematic Location  Knee   Vasopneumatic Pressure Medium   Vasopneumatic Temperature  33                PT Education - 08/07/17 1627    Education provided Yes   Education Details Low load, long duration stretches.    Person(s) Educated Patient   Methods Explanation;Demonstration;Tactile cues;Handout   Comprehension Verbalized understanding;Returned demonstration          PT Short Term Goals - 08/07/17 1639      PT SHORT TERM GOAL #1   Title independent with initial HEP.    Time 1   Period Weeks   Status New           PT Long Term Goals - 08/07/17 1639      PT LONG TERM GOAL #1   Title Increase R knee AROM to 5-110.   Time 4   Period Weeks   Status New     PT LONG TERM GOAL #2   Title Able to do stairs step over step.    Time 4   Period Weeks   Status New     PT LONG TERM GOAL #3   Title Decrease pain by 50%.  Time 4   Period Weeks   Status New     PT LONG TERM GOAL #4   Title Able to walk 1000 feet in the community with [facility] without pain or difficulty.    Time 4    Period Weeks   Status New                Plan - 08/07/17 1629    Clinical Impression Statement Pt's AROM right now is 15-80 in sitting. She reports that her pain level is pretty high but she didn't have very much pain with the evaluation today. She wanted to progress to her cane as she doesn't like using the walker. Michela Pitcher that she is doing fine with stairs but has to go up with only her left foot. Reports a lot of anxiety about PT.    History and Personal Factors relevant to plan of care: R TKA - 07/24/17   Clinical Presentation Evolving   Clinical Decision Making Low   Rehab Potential Good   PT Frequency 3x / week   PT Duration 4 weeks   PT Treatment/Interventions ADLs/Self Care Home Management;Cryotherapy;Electrical Stimulation;Stair training;Gait training;Ultrasound;Iontophoresis 4mg /ml Dexamethasone;Therapeutic activities;Therapeutic exercise;Neuromuscular re-education;Manual techniques;Patient/family education;Scar mobilization;Passive range of motion;Vasopneumatic Device   PT Next Visit Plan Work on ROM and strengthening. Modalities for pain.    PT Home Exercise Plan Continue HEP from HHPT. Low load long duration stretches for flexion/extension.    Consulted and Agree with Plan of Care Patient      Patient will benefit from skilled therapeutic intervention in order to improve the following deficits and impairments:  Abnormal gait, Cardiopulmonary status limiting activity, Decreased activity tolerance, Decreased balance, Decreased mobility, Decreased endurance, Decreased range of motion, Decreased strength, Increased edema, Difficulty walking, Increased muscle spasms, Impaired perceived functional ability, Postural dysfunction, Improper body mechanics, Pain  Visit Diagnosis: Acute pain of right knee  Stiffness of right knee, not elsewhere classified  Difficulty in walking, not elsewhere classified  Muscle weakness (generalized)     Problem List Patient Active Problem  List   Diagnosis Date Noted  . OA (osteoarthritis) of knee 07/24/2017  . Cardiac murmur 01/03/2017  . Essential hypertension 03/17/2015  . Internal hemorrhoids without mention of complication 82/80/0349  . Irritable bowel syndrome 03/04/2014  . Nonspecific abnormal finding in stool contents 01/22/2014  . GERD 07/01/2008  . HYPOTHYROIDISM 06/26/2008  . HYPERCHOLESTEROLEMIA 06/26/2008  . HYPERTRIGLYCERIDEMIA 06/26/2008  . Diverticulosis of colon 04/12/2005  . POLYP, COLON 04/23/2002    Lennart Pall, SPT 08/07/2017, 4:54 PM  [place] [address] [place] [place], [place], [address] Phone: [phone]   Fax:  [phone]  Name: [name] [name] MRN: [id number] Date of Birth: [birth year]

## 2017-08-09 ENCOUNTER — Encounter: Payer: Self-pay | Admitting: Physical Therapy

## 2017-08-09 ENCOUNTER — Ambulatory Visit: Payer: PPO | Admitting: Physical Therapy

## 2017-08-09 DIAGNOSIS — M6281 Muscle weakness (generalized): Secondary | ICD-10-CM

## 2017-08-09 DIAGNOSIS — R2241 Localized swelling, mass and lump, right lower limb: Secondary | ICD-10-CM

## 2017-08-09 DIAGNOSIS — M25561 Pain in right knee: Secondary | ICD-10-CM

## 2017-08-09 DIAGNOSIS — R262 Difficulty in walking, not elsewhere classified: Secondary | ICD-10-CM

## 2017-08-09 DIAGNOSIS — M25661 Stiffness of right knee, not elsewhere classified: Secondary | ICD-10-CM

## 2017-08-09 NOTE — Therapy (Signed)
[place], [place], [address] Phone: [phone]   Fax:  [phone]  Physical Therapy Treatment  Patient Details  Name: [name] [name] MRN: [id number] Date of Birth: [birth year] Referring Provider: Gaynelle Arabian  Encounter Date: 08/09/2017      PT End of Session - 08/09/17 1517    Visit Number 2   PT Start Time 1430   PT Stop Time 1532   PT Time Calculation (min) 62 min   Activity Tolerance Patient tolerated treatment well   Behavior During Therapy [facility] for tasks assessed/performed      Past Medical History:  Diagnosis Date  . Aortic valve stenosis    moderate; visualized on ECHO 01-03-17   . Carotid artery bruit   . Family history of heart disease   . GERD (gastroesophageal reflux disease)   . Gout   . Heart murmur   . History of kidney stones   . Hyperlipemia   . Hypertension   . Kidney stones   . Thyroid disease   . UTI (urinary tract infection) 03/09/14    Past Surgical History:  Procedure Laterality Date  . BLADDER SURGERY     Lift   . lithotripsy   within last 10 years  . OOPHORECTOMY    . PARTIAL HYSTERECTOMY    . TOTAL KNEE ARTHROPLASTY Right 07/24/2017   Procedure: RIGHT TOTAL KNEE ARTHROPLASTY;  Surgeon: Gaynelle Arabian, MD;  [facility];  Service: Orthopedics;  Laterality: Right;  Adductor Block    There were no vitals filed for this visit.      Subjective Assessment - 08/09/17 1437    Subjective Pt reports that she was doing fine until her car ride here. Pt reports that she has some pain in the back of her knee after the car ride.     Currently in Pain? No/denies   Pain Score 0-No pain                         [facility] Adult PT Treatment/Exercise - 08/09/17 0001      Ambulation/Gait   Ambulation/Gait Yes   Ambulation/Gait Assistance 5: Supervision   Ambulation Distance (Feet) 60 Feet   Assistive device Rolling walker;None   Gait Comments  uses rolling walker, some antalgia and decreased step length on right side.      Exercises   Exercises Knee/Hip     Knee/Hip Exercises: Aerobic   Nustep L4 x6 min      Knee/Hip Exercises: Machines for Strengthening   Cybex Leg Press 20lb 2x10   Other Machine fitter pressed 1 black 1 blue 2x15     Knee/Hip Exercises: Seated   Long Arc Quad 2 sets;10 reps;Right   Hamstring Curl Right;2 sets;15 reps   Hamstring Limitations red tband     Modalities   Modalities Vasopneumatic     Vasopneumatic   Number Minutes Vasopneumatic  15 minutes   Vasopnuematic Location  Knee   Vasopneumatic Pressure Low   Vasopneumatic Temperature  33     Manual Therapy   Manual Therapy Passive ROM   Passive ROM R knee flexion and Ext                  PT Short Term Goals - 08/07/17 1639      PT SHORT TERM GOAL #1   Title independent with initial HEP.    Time 1   Period Weeks  Status New           PT Long Term Goals - 08/07/17 1639      PT LONG TERM GOAL #1   Title Increase R knee AROM to 5-110.   Time 4   Period Weeks   Status New     PT LONG TERM GOAL #2   Title Able to do stairs step over step.    Time 4   Period Weeks   Status New     PT LONG TERM GOAL #3   Title Decrease pain by 50%.    Time 4   Period Weeks   Status New     PT LONG TERM GOAL #4   Title Able to walk 1000 feet in the community with [facility] without pain or difficulty.    Time 4   Period Weeks   Status New               Plan - 08/09/17 1520    Clinical Impression Statement Pt tolerated an initial progression to exercises well. RLE is very swollen. Reports increase pain in ht back of her knee from the car ride. Pt enters clinic in [place]. second gait trial attempted without AD 60 rigid RLE. Does reports som R knee pain at end range with MT.   Rehab Potential Good   PT Frequency 3x / week   PT Duration 4 weeks   PT Treatment/Interventions ADLs/Self Care Home Management;Cryotherapy;Electrical  Stimulation;Stair training;Gait training;Ultrasound;Iontophoresis 4mg /ml Dexamethasone;Therapeutic activities;Therapeutic exercise;Neuromuscular re-education;Manual techniques;Patient/family education;Scar mobilization;Passive range of motion;Vasopneumatic Device   PT Next Visit Plan Work on ROM and strengthening. Modalities for pain.       Patient will benefit from skilled therapeutic intervention in order to improve the following deficits and impairments:  Abnormal gait, Cardiopulmonary status limiting activity, Decreased activity tolerance, Decreased balance, Decreased mobility, Decreased endurance, Decreased range of motion, Decreased strength, Increased edema, Difficulty walking, Increased muscle spasms, Impaired perceived functional ability, Postural dysfunction, Improper body mechanics, Pain  Visit Diagnosis: Acute pain of right knee  Stiffness of right knee, not elsewhere classified  Difficulty in walking, not elsewhere classified  Muscle weakness (generalized)  Localized swelling, mass and lump, right lower limb     Problem List Patient Active Problem List   Diagnosis Date Noted  . OA (osteoarthritis) of knee 07/24/2017  . Cardiac murmur 01/03/2017  . Essential hypertension 03/17/2015  . Internal hemorrhoids without mention of complication 53/66/4403  . Irritable bowel syndrome 03/04/2014  . Nonspecific abnormal finding in stool contents 01/22/2014  . GERD 07/01/2008  . HYPOTHYROIDISM 06/26/2008  . HYPERCHOLESTEROLEMIA 06/26/2008  . HYPERTRIGLYCERIDEMIA 06/26/2008  . Diverticulosis of colon 04/12/2005  . POLYP, COLON 04/23/2002    Scot Jun, PTA 08/09/2017, 3:23 PM  [place] [address] [place], [place], [address] Phone: [phone]   Fax:  [phone]  Name: [name] [name] MRN: [id number] Date of Birth: [birth year]

## 2017-08-14 ENCOUNTER — Encounter: Payer: Self-pay | Admitting: Physical Therapy

## 2017-08-14 ENCOUNTER — Ambulatory Visit: Payer: PPO | Admitting: Physical Therapy

## 2017-08-14 DIAGNOSIS — R262 Difficulty in walking, not elsewhere classified: Secondary | ICD-10-CM

## 2017-08-14 DIAGNOSIS — M25661 Stiffness of right knee, not elsewhere classified: Secondary | ICD-10-CM

## 2017-08-14 DIAGNOSIS — M25561 Pain in right knee: Secondary | ICD-10-CM | POA: Diagnosis not present

## 2017-08-14 DIAGNOSIS — M6281 Muscle weakness (generalized): Secondary | ICD-10-CM

## 2017-08-14 DIAGNOSIS — R2241 Localized swelling, mass and lump, right lower limb: Secondary | ICD-10-CM

## 2017-08-14 NOTE — Therapy (Signed)
[name] [name], [place], [address] Phone: [phone]   Fax:  [phone]  Physical Therapy Treatment  Patient Details  Name: [name] [name] MRN: [id number] Date of Birth: 01/13/1946 Referring Provider: Gaynelle Arabian  Encounter Date: 08/14/2017      PT End of Session - 08/14/17 1558    Visit Number 3   Date for PT Re-Evaluation 09/07/17   PT Start Time 1515   PT Stop Time 1612   PT Time Calculation (min) 57 min   Activity Tolerance Patient tolerated treatment well   Behavior During Therapy [facility] for tasks assessed/performed      Past Medical History:  Diagnosis Date  . Aortic valve stenosis    moderate; visualized on ECHO 01-03-17   . Carotid artery bruit   . Family history of heart disease   . GERD (gastroesophageal reflux disease)   . Gout   . Heart murmur   . History of kidney stones   . Hyperlipemia   . Hypertension   . Kidney stones   . Thyroid disease   . UTI (urinary tract infection) 03/09/14    Past Surgical History:  Procedure Laterality Date  . BLADDER SURGERY     Lift   . lithotripsy   within last 10 years  . OOPHORECTOMY    . PARTIAL HYSTERECTOMY    . TOTAL KNEE ARTHROPLASTY Right 07/24/2017   Procedure: RIGHT TOTAL KNEE ARTHROPLASTY;  Surgeon: Gaynelle Arabian, MD;  Location: [facility];  Service: Orthopedics;  Laterality: Right;  Adductor Block    There were no vitals filed for this visit.      Subjective Assessment - 08/14/17 1519    Subjective Pt reports that she has a sore knee today, she feels like she over did it bending her knee back.   Currently in Pain? No/denies   Pain Score 0-No pain                         [facility] Adult PT Treatment/Exercise - 08/14/17 0001      Knee/Hip Exercises: Aerobic   Nustep L4 x6 min      Knee/Hip Exercises: Seated   Long Arc Quad 2 sets;10 reps;Right   Long Arc Quad Weight 2 lbs.   Hamstring Curl Right;2 sets;15  reps   Hamstring Limitations red tband     Knee/Hip Exercises: Supine   Short Arc Quad Sets Right;10 reps;3 sets   Short Arc Quad Sets Limitations 2   Straight Leg Raises Right;2 sets;10 reps;20 reps     Modalities   Modalities Vasopneumatic     Vasopneumatic   Number Minutes Vasopneumatic  15 minutes   Vasopnuematic Location  Knee   Vasopneumatic Pressure Low   Vasopneumatic Temperature  33     Manual Therapy   Manual Therapy Passive ROM   Passive ROM R knee flexion and Ext                  PT Short Term Goals - 08/07/17 1639      PT SHORT TERM GOAL #1   Title independent with initial HEP.    Time 1   Period Weeks   Status New           PT Long Term Goals - 08/07/17 1639      PT LONG TERM GOAL #1   Title Increase R knee AROM to 5-110.   Time 4  Period Weeks   Status New     PT LONG TERM GOAL #2   Title Able to do stairs step over step.    Time 4   Period Weeks   Status New     PT LONG TERM GOAL #3   Title Decrease pain by 50%.    Time 4   Period Weeks   Status New     PT LONG TERM GOAL #4   Title Able to walk 1000 feet in the community with [facility] without pain or difficulty.    Time 4   Period Weeks   Status New               Plan - 08/14/17 1602    Clinical Impression Statement No issues with today's interventions. R knee flexion 105 post treatment actively. Completed all exercises well, Ballotable patella noted.    Rehab Potential Good   PT Frequency 3x / week   PT Duration 4 weeks   PT Treatment/Interventions ADLs/Self Care Home Management;Cryotherapy;Electrical Stimulation;Stair training;Gait training;Ultrasound;Iontophoresis 4mg /ml Dexamethasone;Therapeutic activities;Therapeutic exercise;Neuromuscular re-education;Manual techniques;Patient/family education;Scar mobilization;Passive range of motion;Vasopneumatic Device   PT Next Visit Plan Work on ROM and strengthening. Modalities for pain.       Patient will benefit from  skilled therapeutic intervention in order to improve the following deficits and impairments:  Abnormal gait, Cardiopulmonary status limiting activity, Decreased activity tolerance, Decreased balance, Decreased mobility, Decreased endurance, Decreased range of motion, Decreased strength, Increased edema, Difficulty walking, Increased muscle spasms, Impaired perceived functional ability, Postural dysfunction, Improper body mechanics, Pain  Visit Diagnosis: Acute pain of right knee  Stiffness of right knee, not elsewhere classified  Muscle weakness (generalized)  Localized swelling, mass and lump, right lower limb  Difficulty in walking, not elsewhere classified     Problem List Patient Active Problem List   Diagnosis Date Noted  . OA (osteoarthritis) of knee 07/24/2017  . Cardiac murmur 01/03/2017  . Essential hypertension 03/17/2015  . Internal hemorrhoids without mention of complication 63/87/5643  . Irritable bowel syndrome 03/04/2014  . Nonspecific abnormal finding in stool contents 01/22/2014  . GERD 07/01/2008  . HYPOTHYROIDISM 06/26/2008  . HYPERCHOLESTEROLEMIA 06/26/2008  . HYPERTRIGLYCERIDEMIA 06/26/2008  . Diverticulosis of colon 04/12/2005  . POLYP, COLON 04/23/2002    Scot Jun, PTA 08/14/2017, 4:03 PM  [place] [address] [place], [place], [address] Phone: [phone]   Fax:  [phone]  Name: [name] [name] MRN: [id number] Date of Birth: 1946/11/21

## 2017-08-16 ENCOUNTER — Ambulatory Visit: Payer: PPO | Admitting: Physical Therapy

## 2017-08-16 ENCOUNTER — Encounter: Payer: Self-pay | Admitting: Physical Therapy

## 2017-08-16 DIAGNOSIS — R262 Difficulty in walking, not elsewhere classified: Secondary | ICD-10-CM

## 2017-08-16 DIAGNOSIS — M25561 Pain in right knee: Secondary | ICD-10-CM

## 2017-08-16 DIAGNOSIS — M25661 Stiffness of right knee, not elsewhere classified: Secondary | ICD-10-CM

## 2017-08-16 DIAGNOSIS — R2241 Localized swelling, mass and lump, right lower limb: Secondary | ICD-10-CM

## 2017-08-16 DIAGNOSIS — M6281 Muscle weakness (generalized): Secondary | ICD-10-CM

## 2017-08-16 NOTE — Therapy (Signed)
[place], [place], [address] Phone: [phone]   Fax:  [phone]  Physical Therapy Treatment  Patient Details  Name: [name] [name] MRN: [id number] Date of Birth: 22-Sep-1946 Referring Provider: Gaynelle Arabian  Encounter Date: 08/16/2017      PT End of Session - 08/16/17 1609    Visit Number 4   Date for PT Re-Evaluation 09/07/17   PT Start Time 1525   PT Stop Time 1624   PT Time Calculation (min) 59 min   Activity Tolerance Patient tolerated treatment well   Behavior During Therapy [facility] for tasks assessed/performed      Past Medical History:  Diagnosis Date  . Aortic valve stenosis    moderate; visualized on ECHO 01-03-17   . Carotid artery bruit   . Family history of heart disease   . GERD (gastroesophageal reflux disease)   . Gout   . Heart murmur   . History of kidney stones   . Hyperlipemia   . Hypertension   . Kidney stones   . Thyroid disease   . UTI (urinary tract infection) 03/09/14    Past Surgical History:  Procedure Laterality Date  . BLADDER SURGERY     Lift   . lithotripsy   within last 10 years  . OOPHORECTOMY    . PARTIAL HYSTERECTOMY    . TOTAL KNEE ARTHROPLASTY Right 07/24/2017   Procedure: RIGHT TOTAL KNEE ARTHROPLASTY;  Surgeon: Gaynelle Arabian, MD;  Location: [facility];  Service: Orthopedics;  Laterality: Right;  Adductor Block    There were no vitals filed for this visit.      Subjective Assessment - 08/16/17 1531    Subjective Trouble sleeping is her biggest c/o, "I don't feel sure of walking."   Currently in Pain? No/denies                         [facility] Adult PT Treatment/Exercise - 08/16/17 0001      Ambulation/Gait   Gait Comments no device, a lot of cues to work on longer steps and to decrease the limp, she tends to want to have a shortened stance on the right, did very well with this. also worked on stairs with her, did step over  step on the way up     Knee/Hip Exercises: Aerobic   Recumbent Bike x 6 minutes partial revolutions   Nustep L4 x6 min      Knee/Hip Exercises: Machines for Strengthening   Cybex Knee Flexion 20# 2x15   Cybex Leg Press 20lb 2x10, then no weight going a little lower.     Vasopneumatic   Number Minutes Vasopneumatic  15 minutes   Vasopnuematic Location  Knee   Vasopneumatic Pressure Low   Vasopneumatic Temperature  33                  PT Short Term Goals - 08/16/17 1611      PT SHORT TERM GOAL #1   Title independent with initial HEP.    Status Achieved           PT Long Term Goals - 08/07/17 1639      PT LONG TERM GOAL #1   Title Increase R knee AROM to 5-110.   Time 4   Period Weeks   Status New     PT LONG TERM GOAL #2   Title Able to do stairs step over step.  Time 4   Period Weeks   Status New     PT LONG TERM GOAL #3   Title Decrease pain by 50%.    Time 4   Period Weeks   Status New     PT LONG TERM GOAL #4   Title Able to walk 1000 feet in the community with [facility] without pain or difficulty.    Time 4   Period Weeks   Status New               Plan - 08/16/17 1610    Clinical Impression Statement Patient very happy with how she did today, she was able to do well with cues for longer steps and smoother, she was able to go up stairs step over step, still with significant swelling   PT Next Visit Plan continue to work on gait      Patient will benefit from skilled therapeutic intervention in order to improve the following deficits and impairments:  Abnormal gait, Cardiopulmonary status limiting activity, Decreased activity tolerance, Decreased balance, Decreased mobility, Decreased endurance, Decreased range of motion, Decreased strength, Increased edema, Difficulty walking, Increased muscle spasms, Impaired perceived functional ability, Postural dysfunction, Improper body mechanics, Pain  Visit Diagnosis: Acute pain of right  knee  Stiffness of right knee, not elsewhere classified  Muscle weakness (generalized)  Localized swelling, mass and lump, right lower limb  Difficulty in walking, not elsewhere classified     Problem List Patient Active Problem List   Diagnosis Date Noted  . OA (osteoarthritis) of knee 07/24/2017  . Cardiac murmur 01/03/2017  . Essential hypertension 03/17/2015  . Internal hemorrhoids without mention of complication 29/92/4268  . Irritable bowel syndrome 03/04/2014  . Nonspecific abnormal finding in stool contents 01/22/2014  . GERD 07/01/2008  . HYPOTHYROIDISM 06/26/2008  . HYPERCHOLESTEROLEMIA 06/26/2008  . HYPERTRIGLYCERIDEMIA 06/26/2008  . Diverticulosis of colon 04/12/2005  . POLYP, COLON 04/23/2002    Sumner Boast., PT 08/16/2017, 4:12 PM  [address] [place], [place], [address] Phone: [phone]   Fax:  [phone]  Name: [name] [name] MRN: [id number] Date of Birth: [birth year]

## 2017-08-18 ENCOUNTER — Encounter: Payer: Self-pay | Admitting: Physical Therapy

## 2017-08-18 ENCOUNTER — Ambulatory Visit: Payer: PPO | Admitting: Physical Therapy

## 2017-08-18 DIAGNOSIS — R2241 Localized swelling, mass and lump, right lower limb: Secondary | ICD-10-CM

## 2017-08-18 DIAGNOSIS — M25561 Pain in right knee: Secondary | ICD-10-CM | POA: Diagnosis not present

## 2017-08-18 DIAGNOSIS — R262 Difficulty in walking, not elsewhere classified: Secondary | ICD-10-CM

## 2017-08-18 DIAGNOSIS — M6281 Muscle weakness (generalized): Secondary | ICD-10-CM

## 2017-08-18 DIAGNOSIS — M25661 Stiffness of right knee, not elsewhere classified: Secondary | ICD-10-CM

## 2017-08-18 NOTE — Therapy (Signed)
[place], [place], [address] Phone: [phone]   Fax:  [phone]  Physical Therapy Treatment  Patient Details  Name: [name] [name] MRN: [id number] Date of Birth: Aug 22, 1946 Referring Provider: Gaynelle Arabian  Encounter Date: 08/18/2017      PT End of Session - 08/18/17 1103    Visit Number 5   Date for PT Re-Evaluation 09/07/17   PT Start Time 1011   PT Stop Time 1117   PT Time Calculation (min) 66 min   Activity Tolerance Patient tolerated treatment well   Behavior During Therapy [facility] for tasks assessed/performed      Past Medical History:  Diagnosis Date  . Aortic valve stenosis    moderate; visualized on ECHO 01-03-17   . Carotid artery bruit   . Family history of heart disease   . GERD (gastroesophageal reflux disease)   . Gout   . Heart murmur   . History of kidney stones   . Hyperlipemia   . Hypertension   . Kidney stones   . Thyroid disease   . UTI (urinary tract infection) 03/09/14    Past Surgical History:  Procedure Laterality Date  . BLADDER SURGERY     Lift   . lithotripsy   within last 10 years  . OOPHORECTOMY    . PARTIAL HYSTERECTOMY    . TOTAL KNEE ARTHROPLASTY Right 07/24/2017   Procedure: RIGHT TOTAL KNEE ARTHROPLASTY;  Surgeon: Gaynelle Arabian, MD;  Location: [facility];  Service: Orthopedics;  Laterality: Right;  Adductor Block    There were no vitals filed for this visit.      Subjective Assessment - 08/18/17 1007    Subjective Pt reports that's she had a bad yesterday with pain and swelling.   Currently in Pain? No/denies   Pain Score 0-No pain                         [facility] Adult PT Treatment/Exercise - 08/18/17 0001      Knee/Hip Exercises: Aerobic   Recumbent Bike x 3 minutes partial revolutions   Nustep L3 x6 min      Knee/Hip Exercises: Machines for Strengthening   Cybex Knee Extension 5lb 2x10   Cybex Knee Flexion 20# 2x15   Cybex Leg Press 20lb 2x15, then no weight going a little lower.     Knee/Hip Exercises: Standing   Forward Step Up Right;2 sets;10 reps;Hand Hold: 2;Step Height: 4"   Other Standing Knee Exercises Standing TKE yellow band x10   Other Standing Knee Exercises sit to stand x10     Vasopneumatic   Number Minutes Vasopneumatic  15 minutes   Vasopnuematic Location  Knee   Vasopneumatic Pressure Low   Vasopneumatic Temperature  33                  PT Short Term Goals - 08/16/17 1611      PT SHORT TERM GOAL #1   Title independent with initial HEP.    Status Achieved           PT Long Term Goals - 08/07/17 1639      PT LONG TERM GOAL #1   Title Increase R knee AROM to 5-110.   Time 4   Period Weeks   Status New     PT LONG TERM GOAL #2   Title Able to do stairs step over step.    Time 4  Period Weeks   Status New     PT LONG TERM GOAL #3   Title Decrease pain by 50%.    Time 4   Period Weeks   Status New     PT LONG TERM GOAL #4   Title Able to walk 1000 feet in the community with [facility] without pain or difficulty.    Time 4   Period Weeks   Status New               Plan - 08/18/17 1103    Clinical Impression Statement Pt voiced concern about her progress due to her increase pain and swelling. Explained to pt that pain and swelling is normal, encourage pt to be active this weekend instead of "taking two days off" as she reported that she was going to do. increase time needed to complete today's activities due to fatigue. Some compensation with forward step ups.   Rehab Potential Good   PT Frequency 3x / week   PT Duration 4 weeks   PT Treatment/Interventions ADLs/Self Care Home Management;Cryotherapy;Electrical Stimulation;Stair training;Gait training;Ultrasound;Iontophoresis 4mg /ml Dexamethasone;Therapeutic activities;Therapeutic exercise;Neuromuscular re-education;Manual techniques;Patient/family education;Scar mobilization;Passive range of  motion;Vasopneumatic Device   PT Next Visit Plan continue to work on gait      Patient will benefit from skilled therapeutic intervention in order to improve the following deficits and impairments:  Abnormal gait, Cardiopulmonary status limiting activity, Decreased activity tolerance, Decreased balance, Decreased mobility, Decreased endurance, Decreased range of motion, Decreased strength, Increased edema, Difficulty walking, Increased muscle spasms, Impaired perceived functional ability, Postural dysfunction, Improper body mechanics, Pain  Visit Diagnosis: Acute pain of right knee  Stiffness of right knee, not elsewhere classified  Localized swelling, mass and lump, right lower limb  Difficulty in walking, not elsewhere classified  Muscle weakness (generalized)     Problem List Patient Active Problem List   Diagnosis Date Noted  . OA (osteoarthritis) of knee 07/24/2017  . Cardiac murmur 01/03/2017  . Essential hypertension 03/17/2015  . Internal hemorrhoids without mention of complication 99/24/2683  . Irritable bowel syndrome 03/04/2014  . Nonspecific abnormal finding in stool contents 01/22/2014  . GERD 07/01/2008  . HYPOTHYROIDISM 06/26/2008  . HYPERCHOLESTEROLEMIA 06/26/2008  . HYPERTRIGLYCERIDEMIA 06/26/2008  . Diverticulosis of colon 04/12/2005  . POLYP, COLON 04/23/2002    Scot Jun, PTA 08/18/2017, 11:09 AM  [place], [place], [address] Phone: [phone]   Fax:  [phone]  Name: [name] [name] MRN: [id number] Date of Birth: 02-06-1946

## 2017-08-21 ENCOUNTER — Encounter: Payer: Self-pay | Admitting: Physical Therapy

## 2017-08-21 ENCOUNTER — Ambulatory Visit: Payer: PPO | Admitting: Physical Therapy

## 2017-08-21 DIAGNOSIS — M6281 Muscle weakness (generalized): Secondary | ICD-10-CM

## 2017-08-21 DIAGNOSIS — M25661 Stiffness of right knee, not elsewhere classified: Secondary | ICD-10-CM

## 2017-08-21 DIAGNOSIS — R399 Unspecified symptoms and signs involving the genitourinary system: Secondary | ICD-10-CM | POA: Diagnosis not present

## 2017-08-21 DIAGNOSIS — R262 Difficulty in walking, not elsewhere classified: Secondary | ICD-10-CM

## 2017-08-21 DIAGNOSIS — M25561 Pain in right knee: Secondary | ICD-10-CM | POA: Diagnosis not present

## 2017-08-21 DIAGNOSIS — R2241 Localized swelling, mass and lump, right lower limb: Secondary | ICD-10-CM

## 2017-08-21 NOTE — Therapy (Signed)
[place], [place], [address] Phone: [phone]   Fax:  [phone]  Physical Therapy Treatment  Patient Details  Name: [name] [name] MRN: [id number] Date of Birth: 1946-01-23 Referring Provider: Gaynelle Arabian  Encounter Date: 08/21/2017      PT End of Session - 08/21/17 1449    Visit Number 6   Date for PT Re-Evaluation 09/07/17   PT Start Time 1351   PT Stop Time 1500   PT Time Calculation (min) 69 min   Activity Tolerance Patient tolerated treatment well   Behavior During Therapy [facility] for tasks assessed/performed      Past Medical History:  Diagnosis Date  . Aortic valve stenosis    moderate; visualized on ECHO 01-03-17   . Carotid artery bruit   . Family history of heart disease   . GERD (gastroesophageal reflux disease)   . Gout   . Heart murmur   . History of kidney stones   . Hyperlipemia   . Hypertension   . Kidney stones   . Thyroid disease   . UTI (urinary tract infection) 03/09/14    Past Surgical History:  Procedure Laterality Date  . BLADDER SURGERY     Lift   . lithotripsy   within last 10 years  . OOPHORECTOMY    . PARTIAL HYSTERECTOMY    . TOTAL KNEE ARTHROPLASTY Right 07/24/2017   Procedure: RIGHT TOTAL KNEE ARTHROPLASTY;  Surgeon: Gaynelle Arabian, MD;  Location: [facility];  Service: Orthopedics;  Laterality: Right;  Adductor Block    There were no vitals filed for this visit.      Subjective Assessment - 08/21/17 1405    Subjective Patient reports that she was very active over the weekend, she reports that she is "very tired, no energy", has concerns about discoloration, at this point I told her it looks normal, she will be seeing MD in the next day or so.   Currently in Pain? Yes   Pain Score 1    Pain Location Knee   Pain Orientation Right   Pain Descriptors / Indicators Sore   Aggravating Factors  reports not sleeping well                          [facility] Adult PT Treatment/Exercise - 08/21/17 0001      Transfers   Comments worked on sit to stand without arms, needed cues     Ambulation/Gait   Gait Comments gait no device, cues for step length and to decreased the "waddle", worked on stairs step over step, does weill with HHA going up, needs a lot of verbal cues to descend step over step     Knee/Hip Exercises: Aerobic   Recumbent Bike x 6 minutes, partial revolutions the last 3 minutes   Nustep L4 x6 min      Knee/Hip Exercises: Machines for Strengthening   Cybex Knee Extension 5lb 2x10   Cybex Knee Flexion 20# 2x15   Cybex Leg Press 20lb 2x15, then no weight going a little lower., then right leg only with no weight     Knee/Hip Exercises: Supine   Short Arc Quad Sets Right;10 reps;3 sets   Short Arc Quad Sets Limitations 2#   Bridges Limitations 2x10 bridges with feet on ball     Vasopneumatic   Number Minutes Vasopneumatic  15 minutes   Vasopnuematic Location  Knee   Vasopneumatic Pressure Medium  Vasopneumatic Temperature  33     Manual Therapy   Manual Therapy Passive ROM   Passive ROM R knee flexion and Ext                  PT Short Term Goals - 08/16/17 1611      PT SHORT TERM GOAL #1   Title independent with initial HEP.    Status Achieved           PT Long Term Goals - 08/07/17 1639      PT LONG TERM GOAL #1   Title Increase R knee AROM to 5-110.   Time 4   Period Weeks   Status New     PT LONG TERM GOAL #2   Title Able to do stairs step over step.    Time 4   Period Weeks   Status New     PT LONG TERM GOAL #3   Title Decrease pain by 50%.    Time 4   Period Weeks   Status New     PT LONG TERM GOAL #4   Title Able to walk 1000 feet in the community with [facility] without pain or difficulty.    Time 4   Period Weeks   Status New               Plan - 08/21/17 1449    Clinical Impression Statement Patient really doing well  overall, she does have issues with getting up from sitting and with walking but it really seems to be a lot of mental habits that she has.  I feel that she is progressing well, she has worries about the discoloration, it seems to be normal to me, negative Homan's sign   PT Next Visit Plan continue to work on gait and TKE   Consulted and Agree with Plan of Care Patient      Patient will benefit from skilled therapeutic intervention in order to improve the following deficits and impairments:  Abnormal gait, Cardiopulmonary status limiting activity, Decreased activity tolerance, Decreased balance, Decreased mobility, Decreased endurance, Decreased range of motion, Decreased strength, Increased edema, Difficulty walking, Increased muscle spasms, Impaired perceived functional ability, Postural dysfunction, Improper body mechanics, Pain  Visit Diagnosis: Acute pain of right knee  Stiffness of right knee, not elsewhere classified  Localized swelling, mass and lump, right lower limb  Difficulty in walking, not elsewhere classified  Muscle weakness (generalized)     Problem List Patient Active Problem List   Diagnosis Date Noted  . OA (osteoarthritis) of knee 07/24/2017  . Cardiac murmur 01/03/2017  . Essential hypertension 03/17/2015  . Internal hemorrhoids without mention of complication 83/66/2947  . Irritable bowel syndrome 03/04/2014  . Nonspecific abnormal finding in stool contents 01/22/2014  . GERD 07/01/2008  . HYPOTHYROIDISM 06/26/2008  . HYPERCHOLESTEROLEMIA 06/26/2008  . HYPERTRIGLYCERIDEMIA 06/26/2008  . Diverticulosis of colon 04/12/2005  . POLYP, COLON 04/23/2002    Sumner Boast., PT 08/21/2017, 2:52 PM  [place] [address] [place], [place], [address] Phone: [phone]   Fax:  [phone]  Name: [name] [name] MRN: [id number] Date of Birth: 06/20/1946

## 2017-08-22 DIAGNOSIS — Z471 Aftercare following joint replacement surgery: Secondary | ICD-10-CM | POA: Diagnosis not present

## 2017-08-22 DIAGNOSIS — M1711 Unilateral primary osteoarthritis, right knee: Secondary | ICD-10-CM | POA: Diagnosis not present

## 2017-08-22 DIAGNOSIS — Z96651 Presence of right artificial knee joint: Secondary | ICD-10-CM | POA: Diagnosis not present

## 2017-08-23 ENCOUNTER — Ambulatory Visit: Payer: PPO | Admitting: Physical Therapy

## 2017-08-25 ENCOUNTER — Ambulatory Visit: Payer: PPO | Admitting: Physical Therapy

## 2017-08-25 DIAGNOSIS — R2241 Localized swelling, mass and lump, right lower limb: Secondary | ICD-10-CM

## 2017-08-25 DIAGNOSIS — M25661 Stiffness of right knee, not elsewhere classified: Secondary | ICD-10-CM

## 2017-08-25 DIAGNOSIS — M6281 Muscle weakness (generalized): Secondary | ICD-10-CM

## 2017-08-25 DIAGNOSIS — M25561 Pain in right knee: Secondary | ICD-10-CM | POA: Diagnosis not present

## 2017-08-25 DIAGNOSIS — R262 Difficulty in walking, not elsewhere classified: Secondary | ICD-10-CM

## 2017-08-25 NOTE — Therapy (Signed)
[place], [place], [address] Phone: [phone]   Fax:  [phone]  Physical Therapy Treatment  Patient Details  Name: [name] [name] MRN: [id number] Date of Birth: 04/05/1946 Referring Provider: Gaynelle Arabian  Encounter Date: 08/25/2017      PT End of Session - 08/25/17 1107    Visit Number 7   Date for PT Re-Evaluation 09/07/17   PT Start Time 1055   PT Stop Time 1135   PT Time Calculation (min) 40 min      Past Medical History:  Diagnosis Date  . Aortic valve stenosis    moderate; visualized on ECHO 01-03-17   . Carotid artery bruit   . Family history of heart disease   . GERD (gastroesophageal reflux disease)   . Gout   . Heart murmur   . History of kidney stones   . Hyperlipemia   . Hypertension   . Kidney stones   . Thyroid disease   . UTI (urinary tract infection) 03/09/14    Past Surgical History:  Procedure Laterality Date  . BLADDER SURGERY     Lift   . lithotripsy   within last 10 years  . OOPHORECTOMY    . PARTIAL HYSTERECTOMY    . TOTAL KNEE ARTHROPLASTY Right 07/24/2017   Procedure: RIGHT TOTAL KNEE ARTHROPLASTY;  Surgeon: Gaynelle Arabian, MD;  Location: [facility];  Service: Orthopedics;  Laterality: Right;  Adductor Block    There were no vitals filed for this visit.      Subjective Assessment - 08/25/17 1054    Subjective doing great saw MD and he released me. going to do silver sneakers. he said 2 more PT visits   Currently in Pain? Yes   Pain Score 1    Pain Location Knee            [facility] PT Assessment - 08/25/17 0001      AROM   Right Knee Extension 0   Right Knee Flexion 120     Strength   Right Knee Flexion 4+/5   Right Knee Extension 4+/5                     [facility] Adult PT Treatment/Exercise - 08/25/17 0001      Knee/Hip Exercises: Aerobic   Recumbent Bike 6 min full rev ( seat #6)   Nustep L4 x6 min                  PT Education - 08/25/17 1106    Education provided Yes   Education Details HEP red tband LE seated and standing ex. reviewed and issued gym handout   Person(s) Educated Patient   Methods Explanation;Demonstration;Handout   Comprehension Verbalized understanding;Returned demonstration          PT Short Term Goals - 08/16/17 1611      PT SHORT TERM GOAL #1   Title independent with initial HEP.    Status Achieved           PT Long Term Goals - 08/25/17 1113      PT LONG TERM GOAL #1   Title Increase R knee AROM to 5-110.   Status Achieved     PT LONG TERM GOAL #2   Title Able to do stairs step over step.    Status Achieved     PT LONG TERM GOAL #3   Title Decrease pain by 50%.  Status Achieved     PT LONG TERM GOAL #4   Title Able to walk 1000 feet in the community with [facility] without pain or difficulty.    Status Achieved               Plan - 08/25/17 1115    Clinical Impression Statement all goals met. issued new HEP and pt to check into [facility]   PT Treatment/Interventions ADLs/Self Care Home Management;Cryotherapy;Electrical Stimulation;Stair training;Gait training;Ultrasound;Iontophoresis 60m/ml Dexamethasone;Therapeutic activities;Therapeutic exercise;Neuromuscular re-education;Manual techniques;Patient/family education;Scar mobilization;Passive range of motion;Vasopneumatic Device   PT Next Visit Plan assure HEP and issue ex for gym. D/C      Patient will benefit from skilled therapeutic intervention in order to improve the following deficits and impairments:  Abnormal gait, Cardiopulmonary status limiting activity, Decreased activity tolerance, Decreased balance, Decreased mobility, Decreased endurance, Decreased range of motion, Decreased strength, Increased edema, Difficulty walking, Increased muscle spasms, Impaired perceived functional ability, Postural dysfunction, Improper body mechanics, Pain  Visit Diagnosis: Acute pain of right knee  Stiffness  of right knee, not elsewhere classified  Localized swelling, mass and lump, right lower limb  Difficulty in walking, not elsewhere classified  Muscle weakness (generalized)     Problem List Patient Active Problem List   Diagnosis Date Noted  . OA (osteoarthritis) of knee 07/24/2017  . Cardiac murmur 01/03/2017  . Essential hypertension 03/17/2015  . Internal hemorrhoids without mention of complication 045/99/7741 . Irritable bowel syndrome 03/04/2014  . Nonspecific abnormal finding in stool contents 01/22/2014  . GERD 07/01/2008  . HYPOTHYROIDISM 06/26/2008  . HYPERCHOLESTEROLEMIA 06/26/2008  . HYPERTRIGLYCERIDEMIA 06/26/2008  . Diverticulosis of colon 04/12/2005  . POLYP, COLON 04/23/2002    PAYSEUR,ANGIE PTA 08/25/2017, 11:36 AM  [place] [address]: [phone]  Fax:  [phone] Name: MCHOSEN GARRONMRN: [id number] of Birth: [birth year]

## 2017-08-29 ENCOUNTER — Encounter: Payer: PPO | Admitting: Physical Therapy

## 2017-08-31 ENCOUNTER — Encounter: Payer: Self-pay | Admitting: Physical Therapy

## 2017-08-31 ENCOUNTER — Ambulatory Visit: Payer: PPO | Attending: Orthopedic Surgery | Admitting: Physical Therapy

## 2017-08-31 DIAGNOSIS — R2241 Localized swelling, mass and lump, right lower limb: Secondary | ICD-10-CM | POA: Diagnosis not present

## 2017-08-31 DIAGNOSIS — R262 Difficulty in walking, not elsewhere classified: Secondary | ICD-10-CM | POA: Diagnosis not present

## 2017-08-31 DIAGNOSIS — M25561 Pain in right knee: Secondary | ICD-10-CM | POA: Diagnosis not present

## 2017-08-31 DIAGNOSIS — M25661 Stiffness of right knee, not elsewhere classified: Secondary | ICD-10-CM | POA: Insufficient documentation

## 2017-08-31 NOTE — Therapy (Signed)
[name], [place], [address] Phone: [phone]   Fax:  [phone]  Physical Therapy Treatment  Patient Details  Name: [name] [name] MRN: [id number] Date of Birth: [birth year] Referring Provider: Gaynelle Arabian  Encounter Date: 08/31/2017      PT End of Session - 08/31/17 1439    Visit Number 8   Date for PT Re-Evaluation 09/07/17   PT Start Time 1359   PT Stop Time 1450   PT Time Calculation (min) 51 min   Activity Tolerance Patient tolerated treatment well   Behavior During Therapy [facility] for tasks assessed/performed      Past Medical History:  Diagnosis Date  . Aortic valve stenosis    moderate; visualized on ECHO 01-03-17   . Carotid artery bruit   . Family history of heart disease   . GERD (gastroesophageal reflux disease)   . Gout   . Heart murmur   . History of kidney stones   . Hyperlipemia   . Hypertension   . Kidney stones   . Thyroid disease   . UTI (urinary tract infection) 03/09/14    Past Surgical History:  Procedure Laterality Date  . BLADDER SURGERY     Lift   . lithotripsy   within last 10 years  . OOPHORECTOMY    . PARTIAL HYSTERECTOMY    . TOTAL KNEE ARTHROPLASTY Right 07/24/2017   Procedure: RIGHT TOTAL KNEE ARTHROPLASTY;  Surgeon: Gaynelle Arabian, MD;  [facility];  Service: Orthopedics;  Laterality: Right;  Adductor Block    There were no vitals filed for this visit.      Subjective Assessment - 08/31/17 1406    Subjective Reports that today is her last day with [place].  She does report that she has not tried the exercises we gave her nor did she go to the gym with the instructions that we gave her.  Reports pain in the AM and when trying to sleep   Currently in Pain? Yes   Pain Score 3    Pain Location Knee   Pain Orientation Right   Aggravating Factors  pain at night and the first thing in the AM 8-9/10                         [facility]  Adult PT Treatment/Exercise - 08/31/17 0001      Ambulation/Gait   Gait Comments no device, work on stairs step over step up and down, needing cues , gait out to the car, went over safety for uneven ground     Knee/Hip Exercises: Aerobic   Recumbent Bike 6 min full rev ( seat #6)   Nustep L4 x6 min      Vasopneumatic   Number Minutes Vasopneumatic  15 minutes   Vasopnuematic Location  Knee   Vasopneumatic Pressure Medium   Vasopneumatic Temperature  33                PT Education - 08/31/17 1438    Education provided Yes   Education Details Went over the HEP, PT demonstrated, went over the gym equipment, went over safety, and the need to assure her ROM is not lost   Person(s) Educated Patient   Methods Explanation;Demonstration;Verbal cues;Handout   Comprehension Verbalized understanding;Returned demonstration          PT Short Term Goals - 08/16/17 1611      PT SHORT TERM GOAL #1  Title independent with initial HEP.    Status Achieved           PT Long Term Goals - 09-27-2017 1442      PT LONG TERM GOAL #1   Title Increase R knee AROM to 5-110.   Status Achieved     PT LONG TERM GOAL #2   Title Able to do stairs step over step.    Status Achieved     PT LONG TERM GOAL #3   Title Decrease pain by 50%.    Status Achieved     PT LONG TERM GOAL #4   Title Able to walk 1000 feet in the community with [facility] without pain or difficulty.    Status Achieved               Plan - 2017/09/27 1441    Clinical Impression Statement Patient did not attempt to go to the gym, she did have some questions about the HEP, reviewed this and the gym, went over her walking and how to assure that she limits the antalgic gait that is a habit   PT Next Visit Plan Goals met Discharge   Consulted and Agree with Plan of Care Patient      Patient will benefit from skilled therapeutic intervention in order to improve the following deficits and impairments:  Abnormal gait,  Cardiopulmonary status limiting activity, Decreased activity tolerance, Decreased balance, Decreased mobility, Decreased endurance, Decreased range of motion, Decreased strength, Increased edema, Difficulty walking, Increased muscle spasms, Impaired perceived functional ability, Postural dysfunction, Improper body mechanics, Pain  Visit Diagnosis: Acute pain of right knee  Stiffness of right knee, not elsewhere classified  Localized swelling, mass and lump, right lower limb  Difficulty in walking, not elsewhere classified       G-Codes - 09/27/17 1433    Functional Assessment Tool Used (Outpatient Only) foto 49% limitation   Functional Limitation Mobility: Walking and moving around   Mobility: Walking and Moving Around Current Status [phone]) At least 40 percent but less than 60 percent impaired, limited or restricted   Mobility: Walking and Moving Around Goal Status [phone]) At least 40 percent but less than 60 percent impaired, limited or restricted   Mobility: Walking and Moving Around Discharge Status [phone]) At least 40 percent but less than 60 percent impaired, limited or restricted      Problem List Patient Active Problem List   Diagnosis Date Noted  . OA (osteoarthritis) of knee 07/24/2017  . Cardiac murmur 01/03/2017  . Essential hypertension 03/17/2015  . Internal hemorrhoids without mention of complication 42/70/6237  . Irritable bowel syndrome 03/04/2014  . Nonspecific abnormal finding in stool contents 01/22/2014  . GERD 07/01/2008  . HYPOTHYROIDISM 06/26/2008  . HYPERCHOLESTEROLEMIA 06/26/2008  . HYPERTRIGLYCERIDEMIA 06/26/2008  . Diverticulosis of colon 04/12/2005  . POLYP, COLON 04/23/2002    [name] Boast., PT 2017/09/27, 3:05 PM  [place] [address] [place], [place], [address] Phone: [phone]   Fax:  [phone]  Name: [name] [name] MRN: [id number] Date of Birth: [birth year]

## 2017-09-07 DIAGNOSIS — Z808 Family history of malignant neoplasm of other organs or systems: Secondary | ICD-10-CM | POA: Diagnosis not present

## 2017-09-07 DIAGNOSIS — L570 Actinic keratosis: Secondary | ICD-10-CM | POA: Diagnosis not present

## 2017-09-07 DIAGNOSIS — Z23 Encounter for immunization: Secondary | ICD-10-CM | POA: Diagnosis not present

## 2017-09-07 DIAGNOSIS — L821 Other seborrheic keratosis: Secondary | ICD-10-CM | POA: Diagnosis not present

## 2017-09-07 DIAGNOSIS — L219 Seborrheic dermatitis, unspecified: Secondary | ICD-10-CM | POA: Diagnosis not present

## 2017-09-07 DIAGNOSIS — Z85828 Personal history of other malignant neoplasm of skin: Secondary | ICD-10-CM | POA: Diagnosis not present

## 2017-09-07 DIAGNOSIS — D2271 Melanocytic nevi of right lower limb, including hip: Secondary | ICD-10-CM | POA: Diagnosis not present

## 2017-09-07 DIAGNOSIS — B078 Other viral warts: Secondary | ICD-10-CM | POA: Diagnosis not present

## 2017-09-11 DIAGNOSIS — J069 Acute upper respiratory infection, unspecified: Secondary | ICD-10-CM | POA: Diagnosis not present

## 2017-09-17 DIAGNOSIS — Z87898 Personal history of other specified conditions: Secondary | ICD-10-CM | POA: Diagnosis not present

## 2017-09-17 DIAGNOSIS — E559 Vitamin D deficiency, unspecified: Secondary | ICD-10-CM | POA: Diagnosis not present

## 2017-09-17 DIAGNOSIS — I10 Essential (primary) hypertension: Secondary | ICD-10-CM | POA: Diagnosis not present

## 2017-09-17 DIAGNOSIS — E039 Hypothyroidism, unspecified: Secondary | ICD-10-CM | POA: Diagnosis not present

## 2017-09-17 DIAGNOSIS — E782 Mixed hyperlipidemia: Secondary | ICD-10-CM | POA: Diagnosis not present

## 2017-09-20 DIAGNOSIS — R05 Cough: Secondary | ICD-10-CM | POA: Diagnosis not present

## 2017-09-22 DIAGNOSIS — J40 Bronchitis, not specified as acute or chronic: Secondary | ICD-10-CM | POA: Diagnosis not present

## 2017-10-09 DIAGNOSIS — Z23 Encounter for immunization: Secondary | ICD-10-CM | POA: Diagnosis not present

## 2017-11-30 DIAGNOSIS — R030 Elevated blood-pressure reading, without diagnosis of hypertension: Secondary | ICD-10-CM | POA: Diagnosis not present

## 2017-11-30 DIAGNOSIS — N390 Urinary tract infection, site not specified: Secondary | ICD-10-CM | POA: Diagnosis not present

## 2018-01-16 DIAGNOSIS — L859 Epidermal thickening, unspecified: Secondary | ICD-10-CM | POA: Diagnosis not present

## 2018-01-16 DIAGNOSIS — L570 Actinic keratosis: Secondary | ICD-10-CM | POA: Diagnosis not present

## 2018-01-16 DIAGNOSIS — B351 Tinea unguium: Secondary | ICD-10-CM | POA: Diagnosis not present

## 2018-01-16 DIAGNOSIS — Z23 Encounter for immunization: Secondary | ICD-10-CM | POA: Diagnosis not present

## 2018-01-25 DIAGNOSIS — Z1231 Encounter for screening mammogram for malignant neoplasm of breast: Secondary | ICD-10-CM | POA: Diagnosis not present

## 2018-01-30 ENCOUNTER — Ambulatory Visit (HOSPITAL_COMMUNITY): Payer: PPO | Attending: Cardiovascular Disease

## 2018-01-30 ENCOUNTER — Encounter (INDEPENDENT_AMBULATORY_CARE_PROVIDER_SITE_OTHER): Payer: Self-pay

## 2018-01-30 ENCOUNTER — Other Ambulatory Visit: Payer: Self-pay

## 2018-01-30 DIAGNOSIS — R011 Cardiac murmur, unspecified: Secondary | ICD-10-CM

## 2018-01-30 DIAGNOSIS — I5030 Unspecified diastolic (congestive) heart failure: Secondary | ICD-10-CM | POA: Insufficient documentation

## 2018-01-30 DIAGNOSIS — I080 Rheumatic disorders of both mitral and aortic valves: Secondary | ICD-10-CM | POA: Diagnosis not present

## 2018-02-22 DIAGNOSIS — M1711 Unilateral primary osteoarthritis, right knee: Secondary | ICD-10-CM | POA: Diagnosis not present

## 2018-02-22 DIAGNOSIS — Z471 Aftercare following joint replacement surgery: Secondary | ICD-10-CM | POA: Diagnosis not present

## 2018-02-22 DIAGNOSIS — Z96651 Presence of right artificial knee joint: Secondary | ICD-10-CM | POA: Diagnosis not present

## 2018-02-22 DIAGNOSIS — M25562 Pain in left knee: Secondary | ICD-10-CM | POA: Diagnosis not present

## 2018-02-27 DIAGNOSIS — Z87440 Personal history of urinary (tract) infections: Secondary | ICD-10-CM | POA: Diagnosis not present

## 2018-02-27 DIAGNOSIS — H6123 Impacted cerumen, bilateral: Secondary | ICD-10-CM | POA: Diagnosis not present

## 2018-02-28 ENCOUNTER — Telehealth: Payer: Self-pay | Admitting: Cardiovascular Disease

## 2018-02-28 ENCOUNTER — Encounter: Payer: Self-pay | Admitting: Cardiovascular Disease

## 2018-02-28 ENCOUNTER — Ambulatory Visit: Payer: PPO | Admitting: Cardiovascular Disease

## 2018-02-28 DIAGNOSIS — I350 Nonrheumatic aortic (valve) stenosis: Secondary | ICD-10-CM | POA: Insufficient documentation

## 2018-02-28 DIAGNOSIS — E7800 Pure hypercholesterolemia, unspecified: Secondary | ICD-10-CM | POA: Diagnosis not present

## 2018-02-28 DIAGNOSIS — I10 Essential (primary) hypertension: Secondary | ICD-10-CM | POA: Diagnosis not present

## 2018-02-28 NOTE — Telephone Encounter (Signed)
Lovena Le CMA is aware of patient's request for return call concerning her visit. Message routed.

## 2018-02-28 NOTE — Assessment & Plan Note (Signed)
[name] [name] returns today for follow-up. Her 2-D echo performed 01/30/18 revealed normal LV function with mild concentric hypertrophy with severe aortic stenosis. Her peak gradient 70 mmHg with a valve area of 1.17 cm. She does have mild dyspnea which has not changed in frequency or severity. We'll continue to monitor this on an annual basis.

## 2018-02-28 NOTE — Assessment & Plan Note (Signed)
History of hyperlipidemia not on statin therapy followed by her PCP 

## 2018-02-28 NOTE — Telephone Encounter (Signed)
[name] [name] is calling because she has several questions about her office visit on today . Please call   Thanks

## 2018-02-28 NOTE — Patient Instructions (Signed)

## 2018-02-28 NOTE — Assessment & Plan Note (Signed)
History of essential hypertension blood pressure measured today at 134/98 although has been lower at home recently. She is on hydralazine, losartan and hydrochlorothiazide. I told her to increase her hydralazine from twice a day to 3 times a day.

## 2018-02-28 NOTE — Progress Notes (Signed)
02/28/2018 [name] [name]   Dec 23, 1946  [id number]  Primary Physician [name] Orleans, PA-C Primary Cardiologist: [name] Harp MD FACP, [place], [place], [place]  HPI:  [name] [name] is a 72 y.o.  mildly overweight married Caucasian female para 3, her mother to 6 grandchildren who I last saw in the office 01/03/17.[name] [name] She was self-referred to be established in our practice, a friend of hers is normal patients. 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 Clyda [name]. Her cardiovascular risk factor profile is notable for family history of heart disease with both parents died of myocardial infarctions in their 49s and 5s and a brother who died of an MI at 20. She has never had a heart attack or a clinical stroke. Risk factors otherwise remarkable for treated hypertension and hyperlipidemia. She does have GERD.  She had negative nuclear stress test and 2-D echocardiogram at [facility] 3 years ago. She does have a history of a murmur and had a recent 2-D echo performed 01/30/18 revealing progression of her aortic stenosis now with a peak gradient of 70 and a valve area 1.17 cm. She does complain of some dyspnea on exertion. She denies chest pain.      Current Meds  Medication Sig  . acetaminophen (TYLENOL) 650 MG CR tablet Take 1,300 mg by mouth every 8 (eight) hours as needed for pain.  [name] [name] allopurinol (ZYLOPRIM) 100 MG tablet Take 100 mg by mouth daily.  [name] [name] amLODipine (NORVASC) 10 MG tablet Take 5 mg by mouth at bedtime.   . dicyclomine (BENTYL) 10 MG capsule Take 10 mg by mouth 4 (four) times daily as needed for spasms.   . hydrALAZINE (APRESOLINE) 10 MG tablet Take 10 mg by mouth 2 (two) times daily.  . lansoprazole (PREVACID) 30 MG capsule Take 30 mg by mouth every other day.  . levothyroxine (SYNTHROID, LEVOTHROID) 125 MCG tablet Take 125 mcg by mouth daily before  breakfast.  . losartan-hydrochlorothiazide (HYZAAR) 100-12.5 MG tablet Take 1 tablet by mouth every evening.   . sulfamethoxazole-trimethoprim (BACTRIM,SEPTRA) 400-80 MG tablet Take 1 tablet by mouth as needed.   . traMADol (ULTRAM) 50 MG tablet Take 1-2 tablets (50-100 mg total) by mouth every 6 (six) hours as needed for moderate pain.  . [DISCONTINUED] clidinium-chlordiazePOXIDE (LIBRAX) 5-2.5 MG capsule Take 1 capsule by mouth 2 (two) times daily as needed. (Patient taking differently: Take 1 capsule by mouth 2 (two) times daily as needed. )  . [DISCONTINUED] hydrALAZINE (APRESOLINE) 25 MG tablet Take 10 mg by mouth 2 (two) times daily.   . [DISCONTINUED] losartan-hydrochlorothiazide (HYZAAR) 100-25 MG per tablet Take 1 tablet by mouth daily. (Patient taking differently: Take by mouth daily. Pt takes 12.5 mg)  . [DISCONTINUED] methocarbamol (ROBAXIN) 500 MG tablet Take 1 tablet (500 mg total) by mouth every 6 (six) hours as needed for muscle spasms.  . [DISCONTINUED] oxyCODONE (OXY IR/ROXICODONE) 5 MG immediate release tablet Take 1-2 tablets (5-10 mg total) by mouth every 4 (four) hours as needed for moderate pain or severe pain.  . [DISCONTINUED] rivaroxaban (XARELTO) 10 MG TABS tablet Take 1 tablet (10 mg total) by mouth daily with breakfast. Take Xarelto for two and a half more weeks following discharge from the hospital, then discontinue Xarelto. Once the patient has completed the blood thinner regimen, then take a Baby 81 mg Aspirin daily for  three more weeks.     Allergies  Allergen Reactions  . Amoxicillin Rash  . Amoxicillin-Pot Clavulanate Rash    Has patient had a PCN reaction causing immediate rash, facial/tongue/throat swelling, SOB or lightheadedness with hypotension: No Has patient had a PCN reaction causing severe rash involving mucus membranes or skin necrosis: No Has patient had a PCN reaction that required hospitalization: No Has patient had a PCN reaction occurring within  the last 10 years: No If all of the above answers are "NO", then may proceed with Cephalosporin use.   . Ampicillin Rash  . Clarithromycin Rash  . Clopidogrel Bisulfate Rash  . Nitrofurantoin Itching  . Plavix [Clopidogrel Bisulfate] Rash  . Zestril [Lisinopril] Rash    Social History   Socioeconomic History  . Marital status: Widowed    Spouse name: Not on file  . Number of children: Not on file  . Years of education: Not on file  . Highest education level: Not on file  Social Needs  . Financial resource strain: Not on file  . Food insecurity - worry: Not on file  . Food insecurity - inability: Not on file  . Transportation needs - medical: Not on file  . Transportation needs - non-medical: Not on file  Occupational History  . Not on file  Tobacco Use  . Smoking status: Former Research scientist (life sciences)  . Smokeless tobacco: Never Used  Substance and Sexual Activity  . Alcohol use: No  . Drug use: No  . Sexual activity: Not on file  Other Topics Concern  . Not on file  Social History Narrative  . Not on file     Review of Systems: General: negative for chills, fever, night sweats or weight changes.  Cardiovascular: negative for chest pain, dyspnea on exertion, edema, orthopnea, palpitations, paroxysmal nocturnal dyspnea or shortness of breath Dermatological: negative for rash Respiratory: negative for cough or wheezing Urologic: negative for hematuria Abdominal: negative for nausea, vomiting, diarrhea, bright red blood per rectum, melena, or hematemesis Neurologic: negative for visual changes, syncope, or dizziness All other systems reviewed and are otherwise negative except as noted above.    Blood pressure (!) 134/98, pulse 88, height 5' 5.5" (1.664 m), weight 227 lb (103 kg).  General appearance: alert and no distress Neck: no adenopathy, no JVD, supple, symmetrical, trachea midline, thyroid not enlarged, symmetric, no tenderness/mass/nodules and Bilateral carotid bruit versus  transmitted murmur Lungs: clear to auscultation bilaterally Heart: 2/6 high pitched upper tract murmur consistent with aortic stenosis. Extremities: extremities normal, atraumatic, no cyanosis or edema Pulses: 2+ and symmetric Skin: Skin color, texture, turgor normal. No rashes or lesions Neurologic: Alert and oriented X 3, normal strength and tone. Normal symmetric reflexes. Normal coordination and gait  EKG not performed today  ASSESSMENT AND PLAN:   HYPERCHOLESTEROLEMIA History of hyperlipidemia not on statin therapy followed by her PCP  Essential hypertension History of essential hypertension blood pressure measured today at 134/98 although has been lower at home recently. She is on hydralazine, losartan and hydrochlorothiazide. I told her to increase her hydralazine from twice a day to 3 times a day.  Severe calcific aortic stenosis Ms. Olkowski returns today for follow-up. Her 2-D echo performed 01/30/18 revealed normal LV function with mild concentric hypertrophy with severe aortic stenosis. Her peak gradient 70 mmHg with a valve area of 1.17 cm. She does have mild dyspnea which has not changed in frequency or severity. We'll continue to monitor this on an annual basis.  [name] Harp MD FACP,FACC,FAHA, [facility] 02/28/2018 10:58 AM

## 2018-03-01 NOTE — Telephone Encounter (Signed)
Follow UP;     Pt calling,she would like for somebody to call her,she have so many questions she needs answered.

## 2018-03-01 NOTE — Telephone Encounter (Signed)
Follow up   Patient is upset she has not received call back, she needs to have her call returned today.   She is suppose to have valve replaced and needs to know the name of the valve that needs replaced.  She also wants to know if fatigue is a symptom of her condition.    She will be home til 230pm then she will be on her cell  [phone]

## 2018-03-01 NOTE — Telephone Encounter (Signed)
Spoke with pt, questions answered regarding aortic stenosis and valve surgery.

## 2018-03-01 NOTE — Telephone Encounter (Signed)
Follow up   Leave information on the voicemail  If you do not reach.    She also wants Dr berry to know that with the fatigue she also gets unsteady   Mailed patient a copy of DPR for her to add daughter on her list.

## 2018-03-07 DIAGNOSIS — D2271 Melanocytic nevi of right lower limb, including hip: Secondary | ICD-10-CM | POA: Diagnosis not present

## 2018-03-07 DIAGNOSIS — L821 Other seborrheic keratosis: Secondary | ICD-10-CM | POA: Diagnosis not present

## 2018-03-07 DIAGNOSIS — Z808 Family history of malignant neoplasm of other organs or systems: Secondary | ICD-10-CM | POA: Diagnosis not present

## 2018-03-07 DIAGNOSIS — Z23 Encounter for immunization: Secondary | ICD-10-CM | POA: Diagnosis not present

## 2018-03-07 DIAGNOSIS — Z85828 Personal history of other malignant neoplasm of skin: Secondary | ICD-10-CM | POA: Diagnosis not present

## 2018-03-07 DIAGNOSIS — L570 Actinic keratosis: Secondary | ICD-10-CM | POA: Diagnosis not present

## 2018-03-21 ENCOUNTER — Ambulatory Visit: Payer: PPO | Admitting: Cardiovascular Disease

## 2018-03-23 ENCOUNTER — Encounter: Payer: Self-pay | Admitting: Cardiovascular Disease

## 2018-03-23 ENCOUNTER — Ambulatory Visit: Payer: PPO | Admitting: Cardiovascular Disease

## 2018-03-23 VITALS — BP 148/88 | HR 89 | Ht 65.5 in | Wt 226.6 lb

## 2018-03-23 DIAGNOSIS — I350 Nonrheumatic aortic (valve) stenosis: Secondary | ICD-10-CM | POA: Diagnosis not present

## 2018-03-23 NOTE — Progress Notes (Signed)
[name] [name] comes in today because of multiple questions surrounding her aortic stenosis which is moderately severe. Her valve area is 1.17 cm with a peak gradient of 70 millimeters of mercury. She is accompanied by her daughter [name] today. We discussed her symptoms. She's had no increasing shortness of breath or chest pain We discussed different methods of aortic valve replacement including surgical ward T aVR. We will continue to follow her echo on an annual basis they'll see her back to manually. At this point, she appears stable and I do not feel any urgency to perform a left heart cath given her stability.   Lorretta Harp, M.D., [place], [facility], [place] [address] [place], [place]  [address]  [phone] 03/23/2018 5:26 PM

## 2018-03-23 NOTE — Patient Instructions (Signed)
Medication Instructions: Your physician recommends that you continue on your current medications as directed. Please refer to the Current Medication list given to you today.   Testing/Procedures: Your physician has requested that you have an echocardiogram in February 2020. Echocardiography is a painless test that uses sound waves to create images of your heart. It provides your doctor with information about the size and shape of your heart and how well your heart's chambers and valves are working. This procedure takes approximately one hour. There are no restrictions for this procedure.  Follow-Up: Your physician wants you to follow-up in: 6 months with Dr. Gwenlyn Found. You will receive a reminder letter in the mail two months in advance. If you don't receive a letter, please call our office to schedule the follow-up appointment.  If you need a refill on your cardiac medications before your next appointment, please call your pharmacy.

## 2018-03-23 NOTE — Assessment & Plan Note (Signed)
[name] [name] comes in today because of multiple questions surrounding her aortic stenosis which is moderately severe. Her valve area is 1.17 cm with a peak gradient of 70 millimeters of mercury. She is accompanied by her daughter [name] [name] today. We discussed her symptoms. She's had no increasing shortness of breath or chest pain We discussed different methods of aortic valve replacement including surgical ward T aVR. We will continue to follow her echo on an annual basis they'll see her back to manually. At this point, she appears stable and I do not feel any urgency to perform a left heart cath given her stability.

## 2018-04-12 ENCOUNTER — Telehealth: Payer: Self-pay | Admitting: Cardiovascular Disease

## 2018-04-12 DIAGNOSIS — M1712 Unilateral primary osteoarthritis, left knee: Secondary | ICD-10-CM | POA: Diagnosis not present

## 2018-04-12 DIAGNOSIS — M7062 Trochanteric bursitis, left hip: Secondary | ICD-10-CM | POA: Diagnosis not present

## 2018-04-12 NOTE — Telephone Encounter (Signed)
Patient walked in to office today. She was seen by MD on 03/23/18 for calcific aortic stenosis. She states she will need a valve replacement in the future and would like to know if she has exercise restrictions or limitations as she is going to [employer] and this will be a "work out". She states it is OK to leave a detailed message with recommendations on her VM if she is not home [phone])  Routed to Dr. Gwenlyn Found & Lovena Le CMA

## 2018-04-13 NOTE — Telephone Encounter (Signed)
OK to go to [employer]. No strenuous exercise

## 2018-04-13 NOTE — Telephone Encounter (Signed)
Informed pt of recommendations. Pt verbalized understanding and thanks for the call.

## 2018-04-13 NOTE — Telephone Encounter (Signed)
New message  Pt verbalized that she is returning call for RN  She said that she has some additional questions to ask the RN

## 2018-04-13 NOTE — Telephone Encounter (Signed)
Routed to T. Stover CMA 

## 2018-04-27 DIAGNOSIS — R7309 Other abnormal glucose: Secondary | ICD-10-CM | POA: Diagnosis not present

## 2018-04-27 DIAGNOSIS — E039 Hypothyroidism, unspecified: Secondary | ICD-10-CM | POA: Diagnosis not present

## 2018-04-27 DIAGNOSIS — I350 Nonrheumatic aortic (valve) stenosis: Secondary | ICD-10-CM | POA: Diagnosis not present

## 2018-04-27 DIAGNOSIS — E782 Mixed hyperlipidemia: Secondary | ICD-10-CM | POA: Diagnosis not present

## 2018-04-27 DIAGNOSIS — I10 Essential (primary) hypertension: Secondary | ICD-10-CM | POA: Diagnosis not present

## 2018-04-27 DIAGNOSIS — E559 Vitamin D deficiency, unspecified: Secondary | ICD-10-CM | POA: Diagnosis not present

## 2018-05-31 DIAGNOSIS — Z6837 Body mass index (BMI) 37.0-37.9, adult: Secondary | ICD-10-CM | POA: Diagnosis not present

## 2018-05-31 DIAGNOSIS — Z124 Encounter for screening for malignant neoplasm of cervix: Secondary | ICD-10-CM | POA: Diagnosis not present

## 2018-06-14 DIAGNOSIS — M159 Polyosteoarthritis, unspecified: Secondary | ICD-10-CM | POA: Diagnosis not present

## 2018-06-14 DIAGNOSIS — N390 Urinary tract infection, site not specified: Secondary | ICD-10-CM | POA: Diagnosis not present

## 2018-07-30 DIAGNOSIS — Z87440 Personal history of urinary (tract) infections: Secondary | ICD-10-CM | POA: Diagnosis not present

## 2018-07-30 DIAGNOSIS — K589 Irritable bowel syndrome without diarrhea: Secondary | ICD-10-CM | POA: Diagnosis not present

## 2018-07-30 DIAGNOSIS — R42 Dizziness and giddiness: Secondary | ICD-10-CM | POA: Diagnosis not present

## 2018-07-30 DIAGNOSIS — R3915 Urgency of urination: Secondary | ICD-10-CM | POA: Diagnosis not present

## 2018-08-26 DIAGNOSIS — I350 Nonrheumatic aortic (valve) stenosis: Secondary | ICD-10-CM

## 2018-08-26 HISTORY — DX: Nonrheumatic aortic (valve) stenosis: I35.0

## 2018-09-04 ENCOUNTER — Ambulatory Visit: Payer: PPO | Admitting: Cardiovascular Disease

## 2018-09-04 ENCOUNTER — Encounter: Payer: Self-pay | Admitting: Cardiovascular Disease

## 2018-09-04 VITALS — BP 165/97 | HR 91 | Ht 65.5 in | Wt 224.2 lb

## 2018-09-04 DIAGNOSIS — I10 Essential (primary) hypertension: Secondary | ICD-10-CM

## 2018-09-04 DIAGNOSIS — I350 Nonrheumatic aortic (valve) stenosis: Secondary | ICD-10-CM

## 2018-09-04 DIAGNOSIS — E7800 Pure hypercholesterolemia, unspecified: Secondary | ICD-10-CM

## 2018-09-04 DIAGNOSIS — Z01812 Encounter for preprocedural laboratory examination: Secondary | ICD-10-CM | POA: Diagnosis not present

## 2018-09-04 NOTE — H&P (View-Only) (Signed)
09/04/2018 [name] [name]   Sep 26, 1946  [id number]  Primary Physician Jamesetta Orleans, PA-C Primary Cardiologist: Lorretta Harp MD FACP, [place], [place], [place]  HPI:  [name] [name] is a 72 y.o.  mildly overweight married Caucasian female para 3, her mother to 67 grandchildrenwho I last saw in the office  03/23/2018.  Is accompanied by her daughter [name] [name] today.  Shewasself-referred to be established in our practice, a friend of hers is my patient. 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 Clyda Hurdle. Her cardiovascular risk factor profile is notable for family history of heart disease with both parents died of myocardial infarctions in their 13s and 52s and a brother who died of an MI at 43. She has never had a heart attack or a clinical stroke. Risk factors otherwise remarkable for treated hypertension and hyperlipidemia. She does have GERD. She had negative nuclear stress test and 2-D echocardiogram at [facility] 3 years ago. She does have a history of a murmur and had a recent 2-D echo performed 01/30/18 revealing progression of her aortic stenosis now with a peak gradient of 70 and a valve area 1.17 cm. She does complain of some dyspnea on exertion. She denies chest pain. Since I saw her 6 months ago she is remained stable.  She does notice increasing shortness of breath during the summer months.  She is also had some dizziness which sounds like vertigo.  She wishes to pursue potential TAVR and now that is been released and low risk patients I think is reasonable to proceed with right and left heart cath.   Current Meds  Medication Sig  . acetaminophen (TYLENOL) 650 MG CR tablet Take 1,300 mg by mouth every 8 (eight) hours as needed for pain.  Marland Kitchen allopurinol (ZYLOPRIM) 100 MG tablet Take 100 mg by mouth daily.  Marland Kitchen amLODipine (NORVASC) 10 MG tablet Take 5 mg by  mouth at bedtime.   . Cholecalciferol (VITAMIN D) 2000 units tablet Take 1 tablet by mouth daily.  Marland Kitchen dicyclomine (BENTYL) 10 MG capsule Take 10 mg by mouth 4 (four) times daily as needed for spasms.   . hydrALAZINE (APRESOLINE) 10 MG tablet Take 10 mg by mouth 2 (two) times daily.  . lansoprazole (PREVACID) 30 MG capsule Take 30 mg by mouth every other day.  . levothyroxine (SYNTHROID, LEVOTHROID) 125 MCG tablet Take 125 mcg by mouth daily before breakfast.  . losartan-hydrochlorothiazide (HYZAAR) 100-12.5 MG tablet Take 1 tablet by mouth every evening.   . meclizine (ANTIVERT) 25 MG tablet Take 25 mg by mouth as directed.  . sulfamethoxazole-trimethoprim (BACTRIM,SEPTRA) 400-80 MG tablet Take 1 tablet by mouth as needed.   . traMADol (ULTRAM) 50 MG tablet Take 1-2 tablets (50-100 mg total) by mouth every 6 (six) hours as needed for moderate pain.  Marland Kitchen trimethoprim (TRIMPEX) 100 MG tablet Take 100 mg by mouth as directed.     Allergies  Allergen Reactions  . Amoxicillin Rash  . Amoxicillin-Pot Clavulanate Rash    Has patient had a PCN reaction causing immediate rash, facial/tongue/throat swelling, SOB or lightheadedness with hypotension: No Has patient had a PCN reaction causing severe rash involving mucus membranes or skin necrosis: No Has patient had a PCN reaction that required hospitalization: No Has patient had a PCN reaction occurring within the last 10 years: No If all of the above  answers are "NO", then may proceed with Cephalosporin use.   . Ampicillin Rash  . Clarithromycin Rash  . Clopidogrel Bisulfate Rash  . Nitrofurantoin Itching  . Plavix [Clopidogrel Bisulfate] Rash  . Zestril [Lisinopril] Rash    Social History   Socioeconomic History  . Marital status: Widowed    Spouse name: Not on file  . Number of children: Not on file  . Years of education: Not on file  . Highest education level: Not on file  Occupational History  . Not on file  Social Needs  . Financial  resource strain: Not on file  . Food insecurity:    Worry: Not on file    Inability: Not on file  . Transportation needs:    Medical: Not on file    Non-medical: Not on file  Tobacco Use  . Smoking status: Former Research scientist (life sciences)  . Smokeless tobacco: Never Used  Substance and Sexual Activity  . Alcohol use: No  . Drug use: No  . Sexual activity: Not on file  Lifestyle  . Physical activity:    Days per week: Not on file    Minutes per session: Not on file  . Stress: Not on file  Relationships  . Social connections:    Talks on phone: Not on file    Gets together: Not on file    Attends religious service: Not on file    Active member of club or organization: Not on file    Attends meetings of clubs or organizations: Not on file    Relationship status: Not on file  . Intimate partner violence:    Fear of current or ex partner: Not on file    Emotionally abused: Not on file    Physically abused: Not on file    Forced sexual activity: Not on file  Other Topics Concern  . Not on file  Social History Narrative  . Not on file     Review of Systems: General: negative for chills, fever, night sweats or weight changes.  Cardiovascular: negative for chest pain, dyspnea on exertion, edema, orthopnea, palpitations, paroxysmal nocturnal dyspnea or shortness of breath Dermatological: negative for rash Respiratory: negative for cough or wheezing Urologic: negative for hematuria Abdominal: negative for nausea, vomiting, diarrhea, bright red blood per rectum, melena, or hematemesis Neurologic: negative for visual changes, syncope, or dizziness All other systems reviewed and are otherwise negative except as noted above.    Blood pressure (!) 165/97, pulse 91, height 5' 5.5" (1.664 m), weight 224 lb 3.2 oz (101.7 kg), SpO2 97 %.  General appearance: alert and no distress Neck: no adenopathy, no JVD, supple, symmetrical, trachea midline, thyroid not enlarged, symmetric, no  tenderness/mass/nodules and Bilateral carotid bruits versus transmitted murmur Lungs: clear to auscultation bilaterally Heart: 3/6 high-pitched systolic ejection murmur at the base consistent with severe left ear. Extremities: extremities normal, atraumatic, no cyanosis or edema Pulses: 2+ and symmetric Skin: Skin color, texture, turgor normal. No rashes or lesions Neurologic: Alert and oriented X 3, normal strength and tone. Normal symmetric reflexes. Normal coordination and gait  EKG not performed today.  ASSESSMENT AND PLAN:   HYPERCHOLESTEROLEMIA History of hyperlipidemia intolerant to statin therapy with a recent LDL of 170.  We will recheck a lipid liver profile  Essential hypertension History of essential hypertension blood pressure measured at 165/97.  She is on amlodipine hydralazine, losartan and hydrochlorothiazide.  Her blood pressures at home are much better than this.  Continue current medications.  Severe calcific aortic  stenosis She has severe aortic stenosis with significant dyspnea which has remained fairly stable.  Her last 2D echo performed 01/30/2018 revealed normal LV systolic function, mild concentric LVH, moderately severe left ear with a valve area of 1 cm and a peak gradient of 70.  I am going to perform a right left heart cath on her to define her anatomy and suitability for potential low risk TAVR.      Lorretta Harp MD FACP,FACC,FAHA, [facility] 09/04/2018 4:42 PM

## 2018-09-04 NOTE — Patient Instructions (Signed)
Medication Instructions:  Your physician recommends that you continue on your current medications as directed. Please refer to the Current Medication list given to you today.   Labwork: Your physician recommends that you return for lab work in: FASTING- Thursday 9/12   Testing/Procedures: none  Follow-Up: Your physician recommends that you schedule a follow-up appointment after your procedure.    Any Other Special Instructions Will Be Listed Below (If Applicable).     If you need a refill on your cardiac medications before your next appointment, please call your pharmacy.

## 2018-09-04 NOTE — Assessment & Plan Note (Signed)
History of hyperlipidemia intolerant to statin therapy with a recent LDL of 170.  We will recheck a lipid liver profile

## 2018-09-04 NOTE — Progress Notes (Signed)
09/04/2018 [name] [name]   August 16, 1946  [id number]  Primary Physician [name] Orleans, PA-C Primary Cardiologist: [name] Harp MD FACP, [place], [place], [place]  HPI:  [name] [name] is a 72 y.o.  mildly overweight married Caucasian female para 3, her mother to 48 grandchildrenwho I last saw in the office  03/23/2018.  Is accompanied by her daughter [name] [name] today.  Shewasself-referred to be established in our practice, a friend of hers is my patient. 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 Clyda [name]. Her cardiovascular risk factor profile is notable for family history of heart disease with both parents died of myocardial infarctions in their 71s and 72s and a brother who died of an MI at 58. She has never had a heart attack or a clinical stroke. Risk factors otherwise remarkable for treated hypertension and hyperlipidemia. She does have GERD. She had negative nuclear stress test and 2-D echocardiogram at [facility] [place] 3 years ago. She does have a history of a murmur and had a recent 2-D echo performed 01/30/18 revealing progression of her aortic stenosis now with a peak gradient of 70 and a valve area 1.17 cm. She does complain of some dyspnea on exertion. She denies chest pain. Since I saw her 6 months ago she is remained stable.  She does notice increasing shortness of breath during the summer months.  She is also had some dizziness which sounds like vertigo.  She wishes to pursue potential TAVR and now that is been released and low risk patients I think is reasonable to proceed with right and left heart cath.   Current Meds  Medication Sig  . acetaminophen (TYLENOL) 650 MG CR tablet Take 1,300 mg by mouth every 8 (eight) hours as needed for pain.  Marland Kitchen allopurinol (ZYLOPRIM) 100 MG tablet Take 100 mg by mouth daily.  Marland Kitchen amLODipine (NORVASC) 10 MG tablet Take 5 mg by  mouth at bedtime.   . Cholecalciferol (VITAMIN D) 2000 units tablet Take 1 tablet by mouth daily.  Marland Kitchen dicyclomine (BENTYL) 10 MG capsule Take 10 mg by mouth 4 (four) times daily as needed for spasms.   . hydrALAZINE (APRESOLINE) 10 MG tablet Take 10 mg by mouth 2 (two) times daily.  . lansoprazole (PREVACID) 30 MG capsule Take 30 mg by mouth every other day.  . levothyroxine (SYNTHROID, LEVOTHROID) 125 MCG tablet Take 125 mcg by mouth daily before breakfast.  . losartan-hydrochlorothiazide (HYZAAR) 100-12.5 MG tablet Take 1 tablet by mouth every evening.   . meclizine (ANTIVERT) 25 MG tablet Take 25 mg by mouth as directed.  . sulfamethoxazole-trimethoprim (BACTRIM,SEPTRA) 400-80 MG tablet Take 1 tablet by mouth as needed.   . traMADol (ULTRAM) 50 MG tablet Take 1-2 tablets (50-100 mg total) by mouth every 6 (six) hours as needed for moderate pain.  Marland Kitchen trimethoprim (TRIMPEX) 100 MG tablet Take 100 mg by mouth as directed.     Allergies  Allergen Reactions  . Amoxicillin Rash  . Amoxicillin-Pot Clavulanate Rash    Has patient had a PCN reaction causing immediate rash, facial/tongue/throat swelling, SOB or lightheadedness with hypotension: No Has patient had a PCN reaction causing severe rash involving mucus membranes or skin necrosis: No Has patient had a PCN reaction that required hospitalization: No Has patient had a PCN reaction occurring within the last 10 years: No If all of the above  answers are "NO", then may proceed with Cephalosporin use.   . Ampicillin Rash  . Clarithromycin Rash  . Clopidogrel Bisulfate Rash  . Nitrofurantoin Itching  . Plavix [Clopidogrel Bisulfate] Rash  . Zestril [Lisinopril] Rash    Social History   Socioeconomic History  . Marital status: Widowed    Spouse name: Not on file  . Number of children: Not on file  . Years of education: Not on file  . Highest education level: Not on file  Occupational History  . Not on file  Social Needs  . Financial  resource strain: Not on file  . Food insecurity:    Worry: Not on file    Inability: Not on file  . Transportation needs:    Medical: Not on file    Non-medical: Not on file  Tobacco Use  . Smoking status: Former Research scientist (life sciences)  . Smokeless tobacco: Never Used  Substance and Sexual Activity  . Alcohol use: No  . Drug use: No  . Sexual activity: Not on file  Lifestyle  . Physical activity:    Days per week: Not on file    Minutes per session: Not on file  . Stress: Not on file  Relationships  . Social connections:    Talks on phone: Not on file    Gets together: Not on file    Attends religious service: Not on file    Active member of club or organization: Not on file    Attends meetings of clubs or organizations: Not on file    Relationship status: Not on file  . Intimate partner violence:    Fear of current or ex partner: Not on file    Emotionally abused: Not on file    Physically abused: Not on file    Forced sexual activity: Not on file  Other Topics Concern  . Not on file  Social History Narrative  . Not on file     Review of Systems: General: negative for chills, fever, night sweats or weight changes.  Cardiovascular: negative for chest pain, dyspnea on exertion, edema, orthopnea, palpitations, paroxysmal nocturnal dyspnea or shortness of breath Dermatological: negative for rash Respiratory: negative for cough or wheezing Urologic: negative for hematuria Abdominal: negative for nausea, vomiting, diarrhea, bright red blood per rectum, melena, or hematemesis Neurologic: negative for visual changes, syncope, or dizziness All other systems reviewed and are otherwise negative except as noted above.    Blood pressure (!) 165/97, pulse 91, height 5' 5.5" (1.664 m), weight 224 lb 3.2 oz (101.7 kg), SpO2 97 %.  General appearance: alert and no distress Neck: no adenopathy, no JVD, supple, symmetrical, trachea midline, thyroid not enlarged, symmetric, no  tenderness/mass/nodules and Bilateral carotid bruits versus transmitted murmur Lungs: clear to auscultation bilaterally Heart: 3/6 high-pitched systolic ejection murmur at the base consistent with severe left ear. Extremities: extremities normal, atraumatic, no cyanosis or edema Pulses: 2+ and symmetric Skin: Skin color, texture, turgor normal. No rashes or lesions Neurologic: Alert and oriented X 3, normal strength and tone. Normal symmetric reflexes. Normal coordination and gait  EKG not performed today.  ASSESSMENT AND PLAN:   HYPERCHOLESTEROLEMIA History of hyperlipidemia intolerant to statin therapy with a recent LDL of 170.  We will recheck a lipid liver profile  Essential hypertension History of essential hypertension blood pressure measured at 165/97.  She is on amlodipine hydralazine, losartan and hydrochlorothiazide.  Her blood pressures at home are much better than this.  Continue current medications.  Severe calcific aortic  stenosis She has severe aortic stenosis with significant dyspnea which has remained fairly stable.  Her last 2D echo performed 01/30/2018 revealed normal LV systolic function, mild concentric LVH, moderately severe left ear with a valve area of 1 cm and a peak gradient of 70.  I am going to perform a right left heart cath on her to define her anatomy and suitability for potential low risk TAVR.      [name] Harp MD FACP,FACC,FAHA, [facility] 09/04/2018 4:42 PM

## 2018-09-04 NOTE — Assessment & Plan Note (Signed)
She has severe aortic stenosis with significant dyspnea which has remained fairly stable.  Her last 2D echo performed 01/30/2018 revealed normal LV systolic function, mild concentric LVH, moderately severe left ear with a valve area of 1 cm and a peak gradient of 70.  I am going to perform a right left heart cath on her to define her anatomy and suitability for potential low risk TAVR.

## 2018-09-04 NOTE — Assessment & Plan Note (Signed)
History of essential hypertension blood pressure measured at 165/97.  She is on amlodipine hydralazine, losartan and hydrochlorothiazide.  Her blood pressures at home are much better than this.  Continue current medications.

## 2018-09-05 DIAGNOSIS — I350 Nonrheumatic aortic (valve) stenosis: Secondary | ICD-10-CM | POA: Diagnosis not present

## 2018-09-05 DIAGNOSIS — Z01812 Encounter for preprocedural laboratory examination: Secondary | ICD-10-CM | POA: Diagnosis not present

## 2018-09-05 DIAGNOSIS — I10 Essential (primary) hypertension: Secondary | ICD-10-CM | POA: Diagnosis not present

## 2018-09-05 DIAGNOSIS — E7800 Pure hypercholesterolemia, unspecified: Secondary | ICD-10-CM | POA: Diagnosis not present

## 2018-09-05 LAB — CBC WITH DIFFERENTIAL/PLATELET
BASOS ABS: 0 10*3/uL (ref 0.0–0.2)
Basos: 0 %
EOS (ABSOLUTE): 0.1 10*3/uL (ref 0.0–0.4)
Eos: 1 %
HEMOGLOBIN: 13.8 g/dL (ref 11.1–15.9)
Hematocrit: 40.3 % (ref 34.0–46.6)
Immature Grans (Abs): 0 10*3/uL (ref 0.0–0.1)
Immature Granulocytes: 0 %
LYMPHS ABS: 2.3 10*3/uL (ref 0.7–3.1)
Lymphs: 28 %
MCH: 28.7 pg (ref 26.6–33.0)
MCHC: 34.2 g/dL (ref 31.5–35.7)
MCV: 84 fL (ref 79–97)
MONOCYTES: 9 %
Monocytes Absolute: 0.7 10*3/uL (ref 0.1–0.9)
NEUTROS PCT: 62 %
Neutrophils Absolute: 5.2 10*3/uL (ref 1.4–7.0)
Platelets: 241 10*3/uL (ref 150–450)
RBC: 4.81 x10E6/uL (ref 3.77–5.28)
RDW: 14.9 % (ref 12.3–15.4)
WBC: 8.5 10*3/uL (ref 3.4–10.8)

## 2018-09-05 LAB — LIPID PANEL
Chol/HDL Ratio: 5.6 ratio — ABNORMAL HIGH (ref 0.0–4.4)
Cholesterol, Total: 229 mg/dL — ABNORMAL HIGH (ref 100–199)
HDL: 41 mg/dL (ref >39)
LDL Calculated: 131 mg/dL — ABNORMAL HIGH (ref 0–99)
Triglycerides: 284 mg/dL — ABNORMAL HIGH (ref 0–149)
VLDL Cholesterol Cal: 57 mg/dL — ABNORMAL HIGH (ref 5–40)

## 2018-09-05 LAB — HEPATIC FUNCTION PANEL
ALT: 19 IU/L (ref 0–32)
AST: 19 IU/L (ref 0–40)
Albumin: 4.4 g/dL (ref 3.5–4.8)
Alkaline Phosphatase: 111 IU/L (ref 39–117)
Bilirubin Total: 0.3 mg/dL (ref 0.0–1.2)
Bilirubin, Direct: 0.1 mg/dL (ref 0.00–0.40)
Total Protein: 6.9 g/dL (ref 6.0–8.5)

## 2018-09-05 LAB — BASIC METABOLIC PANEL
BUN / CREAT RATIO: 30 — AB (ref 12–28)
BUN: 18 mg/dL (ref 8–27)
CO2: 25 mmol/L (ref 20–29)
Calcium: 9.8 mg/dL (ref 8.7–10.3)
Chloride: 98 mmol/L (ref 96–106)
Creatinine, Ser: 0.61 mg/dL (ref 0.57–1.00)
GFR calc Af Amer: 103 mL/min/{1.73_m2} (ref >59)
GFR, EST NON AFRICAN AMERICAN: 90 mL/min/{1.73_m2} (ref >59)
Glucose: 107 mg/dL — ABNORMAL HIGH (ref 65–99)
POTASSIUM: 4.4 mmol/L (ref 3.5–5.2)
SODIUM: 138 mmol/L (ref 134–144)

## 2018-09-05 LAB — TSH: TSH: 0.474 u[IU]/mL (ref 0.450–4.500)

## 2018-09-06 ENCOUNTER — Telehealth: Payer: Self-pay | Admitting: Cardiovascular Disease

## 2018-09-06 ENCOUNTER — Telehealth: Payer: Self-pay | Admitting: *Deleted

## 2018-09-06 NOTE — Telephone Encounter (Signed)
° ° °  Patient calling with questions regarding procedure scheduled for 9/16. Please call

## 2018-09-06 NOTE — Telephone Encounter (Signed)
Returned call to patient. She wanted to review her labs in detail, answered her questions about pre-cath. She had questions post-cath - advised she will get instructions at discharge.

## 2018-09-06 NOTE — Telephone Encounter (Signed)
Pt contacted pre-catheterization scheduled at [facility] for: Monday September 10, 2018 12:30 PM Verified arrival time and place: [facility] at: 10:30 AM  No solid food after midnight prior to cath, clear liquids until 5 AM day of procedure. Verified no diabetes medications.  Hold: Losartan-HCTZ -AM of procedure.  Except hold medications AM meds can be  taken pre-cath with sip of water including: ASA 81 mg   Confirmed patient has responsible person to drive home post procedure and for 24 hours after you arrive home: yes

## 2018-09-10 ENCOUNTER — Ambulatory Visit (HOSPITAL_COMMUNITY)
Admission: RE | Admit: 2018-09-10 | Discharge: 2018-09-10 | Disposition: A | Discharge status: Home / Self Care | Payer: PPO | Source: Ambulatory Visit | Attending: Cardiovascular Disease | Admitting: Cardiovascular Disease

## 2018-09-10 ENCOUNTER — Other Ambulatory Visit: Payer: Self-pay

## 2018-09-10 ENCOUNTER — Encounter (HOSPITAL_COMMUNITY): Payer: Self-pay | Admitting: Cardiovascular Disease

## 2018-09-10 ENCOUNTER — Encounter (HOSPITAL_COMMUNITY)
Admission: RE | Disposition: A | Discharge status: Home / Self Care | Payer: Self-pay | Source: Ambulatory Visit | Attending: Cardiovascular Disease

## 2018-09-10 DIAGNOSIS — I2582 Chronic total occlusion of coronary artery: Secondary | ICD-10-CM | POA: Insufficient documentation

## 2018-09-10 DIAGNOSIS — E7800 Pure hypercholesterolemia, unspecified: Secondary | ICD-10-CM | POA: Diagnosis not present

## 2018-09-10 DIAGNOSIS — Z880 Allergy status to penicillin: Secondary | ICD-10-CM | POA: Insufficient documentation

## 2018-09-10 DIAGNOSIS — Z8249 Family history of ischemic heart disease and other diseases of the circulatory system: Secondary | ICD-10-CM | POA: Insufficient documentation

## 2018-09-10 DIAGNOSIS — E663 Overweight: Secondary | ICD-10-CM | POA: Insufficient documentation

## 2018-09-10 DIAGNOSIS — I350 Nonrheumatic aortic (valve) stenosis: Secondary | ICD-10-CM | POA: Diagnosis not present

## 2018-09-10 DIAGNOSIS — I10 Essential (primary) hypertension: Secondary | ICD-10-CM | POA: Insufficient documentation

## 2018-09-10 DIAGNOSIS — Z6836 Body mass index (BMI) 36.0-36.9, adult: Secondary | ICD-10-CM | POA: Insufficient documentation

## 2018-09-10 DIAGNOSIS — Z87891 Personal history of nicotine dependence: Secondary | ICD-10-CM | POA: Insufficient documentation

## 2018-09-10 DIAGNOSIS — I2584 Coronary atherosclerosis due to calcified coronary lesion: Secondary | ICD-10-CM | POA: Diagnosis not present

## 2018-09-10 DIAGNOSIS — K219 Gastro-esophageal reflux disease without esophagitis: Secondary | ICD-10-CM | POA: Insufficient documentation

## 2018-09-10 DIAGNOSIS — I2510 Atherosclerotic heart disease of native coronary artery without angina pectoris: Secondary | ICD-10-CM | POA: Insufficient documentation

## 2018-09-10 HISTORY — PX: RIGHT/LEFT HEART CATH AND CORONARY ANGIOGRAPHY: CATH118266

## 2018-09-10 LAB — POCT I-STAT 3, VENOUS BLOOD GAS (G3P V)
ACID-BASE EXCESS: 1 mmol/L (ref 0.0–2.0)
Bicarbonate: 27.2 mmol/L (ref 20.0–28.0)
O2 SAT: 79 %
TCO2: 29 mmol/L (ref 22–32)
pCO2, Ven: 46.6 mmHg (ref 44.0–60.0)
pH, Ven: 7.374 (ref 7.250–7.430)
pO2, Ven: 45 mmHg (ref 32.0–45.0)

## 2018-09-10 LAB — POCT I-STAT 3, ART BLOOD GAS (G3+)
ACID-BASE EXCESS: 2 mmol/L (ref 0.0–2.0)
Bicarbonate: 27.1 mmol/L (ref 20.0–28.0)
O2 Saturation: 97 %
PH ART: 7.394 (ref 7.350–7.450)
TCO2: 28 mmol/L (ref 22–32)
pCO2 arterial: 44.4 mmHg (ref 32.0–48.0)
pO2, Arterial: 95 mmHg (ref 83.0–108.0)

## 2018-09-10 SURGERY — RIGHT/LEFT HEART CATH AND CORONARY ANGIOGRAPHY
Anesthesia: LOCAL

## 2018-09-10 MED ORDER — SODIUM CHLORIDE 0.9 % WEIGHT BASED INFUSION: 1.0000 mL/kg/h | INTRAVENOUS | Status: DC

## 2018-09-10 MED ORDER — MIDAZOLAM HCL 2 MG/2ML IJ SOLN
INTRAMUSCULAR | Status: AC
  Filled 2018-09-10: qty 2

## 2018-09-10 MED ORDER — SODIUM CHLORIDE 0.9 % IV SOLN: INTRAVENOUS | Status: AC

## 2018-09-10 MED ORDER — HEPARIN (PORCINE) IN NACL 1000-0.9 UT/500ML-% IV SOLN
INTRAVENOUS | Status: AC
  Filled 2018-09-10: qty 1000

## 2018-09-10 MED ORDER — SODIUM CHLORIDE 0.9% FLUSH: 3.0000 mL | Freq: Two times a day (BID) | INTRAVENOUS | Status: DC

## 2018-09-10 MED ORDER — LIDOCAINE HCL (PF) 1 % IJ SOLN
INTRAMUSCULAR | Status: AC
  Filled 2018-09-10: qty 30

## 2018-09-10 MED ORDER — IOHEXOL 350 MG/ML SOLN
INTRAVENOUS | Status: DC | PRN
  Administered 2018-09-10: 70 mL via INTRA_ARTERIAL

## 2018-09-10 MED ORDER — VERAPAMIL HCL 2.5 MG/ML IV SOLN
INTRAVENOUS | Status: AC
  Filled 2018-09-10: qty 2

## 2018-09-10 MED ORDER — HEPARIN SODIUM (PORCINE) 1000 UNIT/ML IJ SOLN
INTRAMUSCULAR | Status: DC | PRN
  Administered 2018-09-10: 5000 [IU] via INTRAVENOUS

## 2018-09-10 MED ORDER — ACETAMINOPHEN 325 MG PO TABS: 650.0000 mg | ORAL_TABLET | ORAL | Status: DC | PRN

## 2018-09-10 MED ORDER — SODIUM CHLORIDE 0.9 % IV SOLN: 250.0000 mL | INTRAVENOUS | Status: DC | PRN

## 2018-09-10 MED ORDER — MORPHINE SULFATE (PF) 10 MG/ML IV SOLN: 2.0000 mg | INTRAVENOUS | Status: DC | PRN

## 2018-09-10 MED ORDER — SODIUM CHLORIDE 0.9% FLUSH: 3.0000 mL | INTRAVENOUS | Status: DC | PRN

## 2018-09-10 MED ORDER — ONDANSETRON HCL 4 MG/2ML IJ SOLN: 4.0000 mg | Freq: Four times a day (QID) | INTRAMUSCULAR | Status: DC | PRN

## 2018-09-10 MED ORDER — HEPARIN (PORCINE) IN NACL 1000-0.9 UT/500ML-% IV SOLN
INTRAVENOUS | Status: DC | PRN
  Administered 2018-09-10 (×2): 500 mL

## 2018-09-10 MED ORDER — FENTANYL CITRATE (PF) 100 MCG/2ML IJ SOLN
INTRAMUSCULAR | Status: AC
  Filled 2018-09-10: qty 2

## 2018-09-10 MED ORDER — ASPIRIN 81 MG PO CHEW: 81.0000 mg | CHEWABLE_TABLET | ORAL | Status: DC

## 2018-09-10 MED ORDER — HEPARIN SODIUM (PORCINE) 1000 UNIT/ML IJ SOLN
INTRAMUSCULAR | Status: AC
  Filled 2018-09-10: qty 1

## 2018-09-10 MED ORDER — VERAPAMIL HCL 2.5 MG/ML IV SOLN
INTRAVENOUS | Status: DC | PRN
  Administered 2018-09-10: 5 mL via INTRA_ARTERIAL

## 2018-09-10 MED ORDER — SODIUM CHLORIDE 0.9 % WEIGHT BASED INFUSION
3.0000 mL/kg/h | INTRAVENOUS | Status: DC
  Administered 2018-09-10: 3 mL/kg/h via INTRAVENOUS

## 2018-09-10 MED ORDER — MIDAZOLAM HCL 2 MG/2ML IJ SOLN
INTRAMUSCULAR | Status: DC | PRN
  Administered 2018-09-10: 1 mg via INTRAVENOUS

## 2018-09-10 MED ORDER — LIDOCAINE HCL (PF) 1 % IJ SOLN
INTRAMUSCULAR | Status: DC | PRN
  Administered 2018-09-10 (×2): 2 mL via INTRADERMAL

## 2018-09-10 MED ORDER — FENTANYL CITRATE (PF) 100 MCG/2ML IJ SOLN
INTRAMUSCULAR | Status: DC | PRN
  Administered 2018-09-10: 25 ug via INTRAVENOUS

## 2018-09-10 SURGICAL SUPPLY — 14 items
CATH BALLN WEDGE 5F 110CM (CATHETERS) ×2 IMPLANT
CATH INFINITI JR4 5F (CATHETERS) ×2 IMPLANT
CATH OPTITORQUE TIG 4.0 5F (CATHETERS) ×2 IMPLANT
DEVICE RAD COMP TR BAND LRG (VASCULAR PRODUCTS) ×2 IMPLANT
GLIDESHEATH SLEND A-KIT 6F 22G (SHEATH) ×2 IMPLANT
GUIDEWIRE INQWIRE 1.5J.035X260 (WIRE) ×1 IMPLANT
INQWIRE 1.5J .035X260CM (WIRE) ×2
KIT HEART LEFT (KITS) ×2 IMPLANT
PACK CARDIAC CATHETERIZATION (CUSTOM PROCEDURE TRAY) ×2 IMPLANT
SHEATH GLIDE SLENDER 4/5FR (SHEATH) ×2 IMPLANT
TRANSDUCER W/STOPCOCK (MISCELLANEOUS) ×2 IMPLANT
TUBING CIL FLEX 10 FLL-RA (TUBING) ×2 IMPLANT
WIRE EMERALD ST .035X150CM (WIRE) ×2 IMPLANT
WIRE HI TORQ VERSACORE-J 145CM (WIRE) ×2 IMPLANT

## 2018-09-10 NOTE — Discharge Instructions (Signed)
Drink plenty of fluids over next 48 hours and keep right wrist elevated at heart level for 24 hours ° °Radial Site Care °Refer to this sheet in the next few weeks. These instructions provide you with information about caring for yourself after your procedure. Your health care provider may also give you more specific instructions. Your treatment has been planned according to current medical practices, but problems sometimes occur. Call your health care provider if you have any problems or questions after your procedure. °What can I expect after the procedure? °After your procedure, it is typical to have the following: °· Bruising at the radial site that usually fades within 1-2 weeks. °· Blood collecting in the tissue (hematoma) that may be painful to the touch. It should usually decrease in size and tenderness within 1-2 weeks. ° °Follow these instructions at home: °· Take medicines only as directed by your health care provider. °· You may shower 24-48 hours after the procedure or as directed by your health care provider. Remove the bandage (dressing) and gently wash the site with plain soap and water. Pat the area dry with a clean towel. Do not rub the site, because this may cause bleeding. °· Do not take baths, swim, or use a hot tub until your health care provider approves. °· Check your insertion site every day for redness, swelling, or drainage. °· Do not apply powder or lotion to the site. °· Do not flex or bend the affected arm for 24 hours or as directed by your health care provider. °· Do not push or pull heavy objects with the affected arm for 24 hours or as directed by your health care provider. °· Do not lift over 10 lb (4.5 kg) for 5 days after your procedure or as directed by your health care provider. °· Ask your health care provider when it is okay to: °? Return to work or school. °? Resume usual physical activities or sports. °? Resume sexual activity. °· Do not drive home if you are discharged the  same day as the procedure. Have someone else drive you. °· You may drive 24 hours after the procedure unless otherwise instructed by your health care provider. °· Do not operate machinery or power tools for 24 hours after the procedure. °· If your procedure was done as an outpatient procedure, which means that you went home the same day as your procedure, a responsible adult should be with you for the first 24 hours after you arrive home. °· Keep all follow-up visits as directed by your health care provider. This is important. °Contact a health care provider if: °· You have a fever. °· You have chills. °· You have increased bleeding from the radial site. Hold pressure on the site. °Get help right away if: °· You have unusual pain at the radial site. °· You have redness, warmth, or swelling at the radial site. °· You have drainage (other than a small amount of blood on the dressing) from the radial site. °· The radial site is bleeding, and the bleeding does not stop after 30 minutes of holding steady pressure on the site. °· Your arm or hand becomes pale, cool, tingly, or numb. °This information is not intended to replace advice given to you by your health care provider. Make sure you discuss any questions you have with your health care provider. °Document Released: 01/14/2011 Document Revised: 05/19/2016 Document Reviewed: 06/30/2014 °Elsevier Interactive Patient Education © 2018 [employer] ° °

## 2018-09-10 NOTE — Interval H&P Note (Signed)
Cath Lab Visit (complete for each Cath Lab visit)  Clinical Evaluation Leading to the Procedure:   ACS: No.  Non-ACS:    Anginal Classification: CCS II  Anti-ischemic medical therapy: No Therapy  Non-Invasive Test Results: No non-invasive testing performed  Prior CABG: No previous CABG      History and Physical Interval Note:  09/10/2018 3:20 PM  [name] [name]  has presented today for surgery, with the diagnosis of as  The various methods of treatment have been discussed with the patient and family. After consideration of risks, benefits and other options for treatment, the patient has consented to  Procedure(s): RIGHT/LEFT HEART CATH AND CORONARY ANGIOGRAPHY (N/A) as a surgical intervention .  The patient's history has been reviewed, patient examined, no change in status, stable for surgery.  I have reviewed the patient's chart and labs.  Questions were answered to the patient's satisfaction.     Quay Burow

## 2018-09-11 ENCOUNTER — Telehealth: Payer: Self-pay | Admitting: Cardiovascular Disease

## 2018-09-11 NOTE — Telephone Encounter (Signed)
New Message:   Patient calling she is having some concerns about what to take and not to take she had a surgery on yesterday. Patient stated no one told her what to do.

## 2018-09-11 NOTE — Telephone Encounter (Signed)
Spoke with pt who states she was calling with questions in regards to restrictions after her Cath. Pt advised that she is ok to resume normal hand action 24 hr after Cath as long its within weight restrictions. Pt also states they found a blockage and wanted to know if there are any life style restriction that's required. Routed to MD

## 2018-09-13 ENCOUNTER — Telehealth: Payer: Self-pay | Admitting: Cardiovascular Disease

## 2018-09-13 DIAGNOSIS — D2271 Melanocytic nevi of right lower limb, including hip: Secondary | ICD-10-CM | POA: Diagnosis not present

## 2018-09-13 DIAGNOSIS — L821 Other seborrheic keratosis: Secondary | ICD-10-CM | POA: Diagnosis not present

## 2018-09-13 DIAGNOSIS — Z85828 Personal history of other malignant neoplasm of skin: Secondary | ICD-10-CM | POA: Diagnosis not present

## 2018-09-13 DIAGNOSIS — Z808 Family history of malignant neoplasm of other organs or systems: Secondary | ICD-10-CM | POA: Diagnosis not present

## 2018-09-13 NOTE — Telephone Encounter (Signed)
New Message   Pt is calling with questions about the cath she had on 9/16. Please call

## 2018-09-13 NOTE — Telephone Encounter (Signed)
Spoke with pt. Pt sts that she would like to get a flu vaccine today. Adv her that it would be ok, pt denies having any viral symptoms.  Pt would like to know if she can attend a movie theatre. Adv her that going to see a movie would be ok.   She has several questions about her plan of care that I could not answer. Adv her that a message has already been sent to [facility] to call her.   Pt voiced appreciation for the call and verbalized understanding.

## 2018-09-18 DIAGNOSIS — H1131 Conjunctival hemorrhage, right eye: Secondary | ICD-10-CM | POA: Diagnosis not present

## 2018-09-20 ENCOUNTER — Other Ambulatory Visit: Payer: Self-pay | Admitting: *Deleted

## 2018-09-20 ENCOUNTER — Institutional Professional Consult (permissible substitution): Payer: PPO | Admitting: Thoracic Surgery (Cardiothoracic Vascular Surgery)

## 2018-09-20 ENCOUNTER — Encounter: Payer: Self-pay | Admitting: Thoracic Surgery (Cardiothoracic Vascular Surgery)

## 2018-09-20 ENCOUNTER — Other Ambulatory Visit: Payer: Self-pay

## 2018-09-20 VITALS — BP 135/90 | HR 88 | Resp 16 | Ht 65.0 in | Wt 222.0 lb

## 2018-09-20 DIAGNOSIS — I2510 Atherosclerotic heart disease of native coronary artery without angina pectoris: Secondary | ICD-10-CM | POA: Diagnosis not present

## 2018-09-20 DIAGNOSIS — I350 Nonrheumatic aortic (valve) stenosis: Secondary | ICD-10-CM

## 2018-09-20 DIAGNOSIS — Z01818 Encounter for other preprocedural examination: Secondary | ICD-10-CM

## 2018-09-20 DIAGNOSIS — I700 Atherosclerosis of aorta: Secondary | ICD-10-CM

## 2018-09-20 NOTE — Progress Notes (Signed)
PCP is Nickola Major Referring Provider is Lorretta Harp, MD  Chief Complaint  Patient presents with  . Coronary Artery Disease    Surgical eval, Cardiac Cath 09/10/18, ECHO 01/30/18  . Aortic Stenosis    HPI: Ms. [name] [name] is sent for consultation regarding aortic stenosis and two-vessel coronary disease.  [name] [name] is a 72 year old woman with a history of obesity, hypertension, hyperlipidemia, reflux, arthritis, gout, remote TIA and known aortic stenosis.  He has a strong family history of cardiac disease with both parents and a brother having had surgery young ages.  About 3 years ago she had a negative nuclear stress test at [facility].  She developed a heart murmur as an adult.  She was followed by Dr. Gwenlyn Found.  Earlier this year she was noted to have had progression of her aortic stenosis with peak gradient of 70 and a valve area estimated at 1.17 cm.  She recently has been having worsening shortness of breath with exertion.  She says it varies from day-to-day but stiffly worse than it was several months ago.  She will get short of breath with walking up a flight of stairs but can make it to the top.  She denies any chest pain, pressure, or tightness.  She has not had any syncope or presyncope.  She had a dental cleaning about 3 months ago  Past Medical History:  Diagnosis Date  . Aortic valve stenosis    moderate; visualized on ECHO 01-03-17   . Carotid artery bruit   . Family history of heart disease   . GERD (gastroesophageal reflux disease)   . Gout   . Heart murmur   . History of kidney stones   . Hyperlipemia   . Hypertension   . Kidney stones   . Thyroid disease   . UTI (urinary tract infection) 03/09/14    Past Surgical History:  Procedure Laterality Date  . BLADDER SURGERY     Lift   . lithotripsy   within last 10 years  . OOPHORECTOMY    . PARTIAL HYSTERECTOMY    . RIGHT/LEFT HEART CATH AND CORONARY ANGIOGRAPHY N/A 09/10/2018   Procedure: RIGHT/LEFT  HEART CATH AND CORONARY ANGIOGRAPHY;  Surgeon: Lorretta Harp, MD;  Location: [facility];  Service: Cardiovascular;  Laterality: N/A;  . TOTAL KNEE ARTHROPLASTY Right 07/24/2017   Procedure: RIGHT TOTAL KNEE ARTHROPLASTY;  Surgeon: Gaynelle Arabian, MD;  Location: [facility];  Service: Orthopedics;  Laterality: Right;  Adductor Block    Family History  Problem Relation Age of Onset  . Heart disease Mother   . Heart disease Father   . Colon cancer Neg Hx     Social History Social History   Tobacco Use  . Smoking status: Former Smoker    Packs/day: 0.80    Years: 20.00    Pack years: 16.00    Types: Cigarettes    Last attempt to quit: 09/21/1987    Years since quitting: 31.0  . Smokeless tobacco: Never Used  . Tobacco comment: socially  Substance Use Topics  . Alcohol use: No  . Drug use: No    Current Outpatient Medications  Medication Sig Dispense Refill  . acetaminophen (TYLENOL) 650 MG CR tablet Take 1,300 mg by mouth every 8 (eight) hours as needed for pain.    Marland Kitchen allopurinol (ZYLOPRIM) 100 MG tablet Take 100 mg by mouth daily.    Marland Kitchen amLODipine (NORVASC) 10 MG tablet Take 5 mg by mouth at  bedtime.     . Cholecalciferol (VITAMIN D) 2000 units tablet Take 1 tablet by mouth daily.    Marland Kitchen dicyclomine (BENTYL) 10 MG capsule Take 10 mg by mouth 4 (four) times daily as needed for spasms.     . hydrALAZINE (APRESOLINE) 10 MG tablet Take 10 mg by mouth 2 (two) times daily.    . lansoprazole (PREVACID) 30 MG capsule Take 30 mg by mouth daily as needed (heartburn).     Marland Kitchen levothyroxine (SYNTHROID, LEVOTHROID) 125 MCG tablet Take 125 mcg by mouth daily before breakfast.    . Liniments (SALONPAS PAIN RELIEF PATCH EX) Apply 1 patch topically daily as needed (pain).    Marland Kitchen losartan-hydrochlorothiazide (HYZAAR) 100-12.5 MG tablet Take 1 tablet by mouth every evening.     . meclizine (ANTIVERT) 25 MG tablet Take 25 mg by mouth 3 (three) times daily as needed for dizziness.     Marland Kitchen Propylene  Glycol (SYSTANE BALANCE) 0.6 % SOLN Place 1 drop into both eyes 2 (two) times daily as needed (dry eyes).    . traMADol (ULTRAM) 50 MG tablet Take 1-2 tablets (50-100 mg total) by mouth every 6 (six) hours as needed for moderate pain. 56 tablet 0  . trimethoprim (TRIMPEX) 100 MG tablet Take 100 mg by mouth daily as needed (For UTI Prevention).      No current facility-administered medications for this visit.     Allergies  Allergen Reactions  . Amoxicillin Rash  . Amoxicillin-Pot Clavulanate Rash    Has patient had a PCN reaction causing immediate rash, facial/tongue/throat swelling, SOB or lightheadedness with hypotension: No Has patient had a PCN reaction causing severe rash involving mucus membranes or skin necrosis: No Has patient had a PCN reaction that required hospitalization: No Has patient had a PCN reaction occurring within the last 10 years: No If all of the above answers are "NO", then may proceed with Cephalosporin use.   . Ampicillin Rash  . Clarithromycin Rash  . Clopidogrel Bisulfate Rash  . Nitrofurantoin Itching  . Plavix [Clopidogrel Bisulfate] Rash  . Zestril [Lisinopril] Rash    Review of Systems  Constitutional: Positive for activity change and fatigue. Negative for unexpected weight change.  HENT: Negative for trouble swallowing and voice change.   Eyes: Negative for visual disturbance.  Respiratory: Positive for shortness of breath. Negative for cough and wheezing.   Cardiovascular: Positive for leg swelling. Negative for chest pain and palpitations.  Gastrointestinal: Positive for abdominal pain (Reflux) and diarrhea.  Genitourinary: Negative for difficulty urinating and dysuria.       History of kidney stones and UTIs  Musculoskeletal: Positive for arthralgias and joint swelling.  Skin: Positive for rash.  Neurological: Negative for syncope and weakness.       Asymptomatic mini stroke diagnosed by MR  Hematological: Negative for adenopathy. Does not  bruise/bleed easily.  All other systems reviewed and are negative.   BP 135/90 (BP Location: Left Arm, Patient Position: Sitting, Cuff Size: Large)   Pulse 88   Resp 16   Ht 5\' 5"  (1.651 m)   Wt 222 lb (100.7 kg)   SpO2 97% Comment: RA  BMI 36.94 kg/m  Physical Exam  Constitutional: She is oriented to person, place, and time. No distress.  Obese  HENT:  Head: Normocephalic and atraumatic.  Mouth/Throat: No oropharyngeal exudate.  Eyes: Pupils are equal, round, and reactive to light. Conjunctivae and EOM are normal. No scleral icterus.  Neck: Neck supple. No thyromegaly present.  Transmitted murmur  versus bruits bilaterally  Cardiovascular: Normal rate, regular rhythm and intact distal pulses. Exam reveals no gallop and no friction rub.  Murmur (3/6 crescendo decrescendo murmur) heard. Pulmonary/Chest: Effort normal and breath sounds normal. No respiratory distress. She has no wheezes. She has no rales.  Abdominal: Soft. She exhibits no distension. There is no tenderness.  Musculoskeletal: She exhibits no edema or deformity.  Lymphadenopathy:    She has no cervical adenopathy.  Neurological: She is alert and oriented to person, place, and time. No cranial nerve deficit. She exhibits normal muscle tone. Coordination normal.  Skin: Skin is warm and dry.  Vitals reviewed.    Diagnostic Tests: Cardiac catheterization 09/10/2018 Coronary Findings   Diagnostic  Dominance: Right  Left Anterior Descending  Prox LAD lesion 95% stenosed  Prox LAD lesion is 95% stenosed. The lesion is calcified.  Right Coronary Artery  Mid RCA lesion 100% stenosed  Mid RCA lesion is 100% stenosed.  Intervention   No interventions have been documented.  Right Heart   Right Heart Pressures Hemodynamic findings consistent with aortic valve stenosis. Right atrial pressure- 9/5 Right ventricular pressure- 33/1 Pulmonary artery pressure- 35/8, mean 18 Pulmonary wedge pressure- A-wave 15, V wave 11,  mean 11 Aortic valve pressure gradient- 32 mmHg Aortic valve area- 1.43 cm with an index 0.69cm/m Cardiac output-8.15 L/min by Fick LVEDP-14  Echocardiogram 01/30/2018 Study Conclusions  - Left ventricle: The cavity size was normal. There was mild   concentric hypertrophy. Systolic function was vigorous. The   estimated ejection fraction was in the range of 65% to 70%. Wall   motion was normal; there were no regional wall motion   abnormalities. Doppler parameters are consistent with abnormal   left ventricular relaxation (grade 1 diastolic dysfunction).   Doppler parameters are consistent with elevated mean left atrial   filling pressure. - Aortic valve: Right coronary and left coronary cusp mobility was   severely restricted. There was moderate to severe stenosis. - Mitral valve: There was mild regurgitation.  Impressions:  - Compared to January 2018, aortic stenosis has worsened and may be   severe, but the jet remains early peaking (suggesting   non-critical stenosis). There are now signs of elevated mean left   atrial pressure. I personally reviewed the catheterization images and concur with the findings noted above  Impression: [name] [name] is a 72 year old woman with a strong family history of cardiac disease, as well as a history of obesity, hypertension, hyperlipidemia, reflux, arthritis, gout, and a remote TIA-asymptomatic.  She has been followed for known aortic stenosis.  Earlier this year she had an echocardiogram which showed some progression of disease.  She has been having worsening exertional shortness of breath.  She does not have any chest pain or syncope.  She was being evaluated by Dr. Gwenlyn Found with consideration for TAVR.  He did cardiac catheterization and she was found to have total occlusion of her right coronary and a 95% proximal LAD stenosis.  She now is referred for aortic valve replacement and coronary artery bypass grafting.  Coronary bypass grafting  and aortic valve replacement are indicated for survival benefit and relief of symptoms.  We discussed valve options including mechanical versus tissue valves.  Given her age tissue valve would be the appropriate choice.  She is in agreement with that.  I discussed the proposed operation with Ms. Tondreau and her daughter.  We discussed the general nature of the procedure including the need for general anesthesia, the use of cardiopulmonary bypass,  the incisions to be used, and the use of drainage tubes postoperatively. We discussed the expected hospital stay, overall recovery and short and long term outcomes.  I reviewed the indications, risks, benefits, and alternatives.  They understand the risks include, but are not limited to death, stroke, MI, DVT/PE, bleeding, possible need for transfusion, infections, cardiac arrhythmias, complete heart block requiring permanent pacemaker placement, as well as other organ system dysfunction including respiratory, renal, or GI complications.  She accepts the risks and agrees to proceed.  She wishes to proceed as soon as possible.  We will plan for surgery on Monday, 09/20/2018.  On reviewing her catheterization images there is some eggshell-like calcification of the aorta and the aortic root.  I want to get a CT of the chest without contrast to evaluate for aortic calcification to make sure that would not alter our surgical plan.  Plan: CT chest without contrast to evaluate aortic calcification Aortic valve replacement and coronary bypass grafting x2 on Monday, 09/24/2018  Melrose Nakayama, MD [facility] [phone]

## 2018-09-20 NOTE — H&P (View-Only) (Signed)
PCP is Nickola Major Referring Provider is Lorretta Harp, MD  Chief Complaint  Patient presents with  . Coronary Artery Disease    Surgical eval, Cardiac Cath 09/10/18, ECHO 01/30/18  . Aortic Stenosis    HPI: Ms. Briski is sent for consultation regarding aortic stenosis and two-vessel coronary disease.  [name] [name] is a 72 year old woman with a history of obesity, hypertension, hyperlipidemia, reflux, arthritis, gout, remote TIA and known aortic stenosis.  He has a strong family history of cardiac disease with both parents and a brother having had surgery young ages.  About 3 years ago she had a negative nuclear stress test at [facility].  She developed a heart murmur as an adult.  She was followed by Dr. Gwenlyn Found.  Earlier this year she was noted to have had progression of her aortic stenosis with peak gradient of 70 and a valve area estimated at 1.17 cm.  She recently has been having worsening shortness of breath with exertion.  She says it varies from day-to-day but stiffly worse than it was several months ago.  She will get short of breath with walking up a flight of stairs but can make it to the top.  She denies any chest pain, pressure, or tightness.  She has not had any syncope or presyncope.  She had a dental cleaning about 3 months ago  Past Medical History:  Diagnosis Date  . Aortic valve stenosis    moderate; visualized on ECHO 01-03-17   . Carotid artery bruit   . Family history of heart disease   . GERD (gastroesophageal reflux disease)   . Gout   . Heart murmur   . History of kidney stones   . Hyperlipemia   . Hypertension   . Kidney stones   . Thyroid disease   . UTI (urinary tract infection) 03/09/14    Past Surgical History:  Procedure Laterality Date  . BLADDER SURGERY     Lift   . lithotripsy   within last 10 years  . OOPHORECTOMY    . PARTIAL HYSTERECTOMY    . RIGHT/LEFT HEART CATH AND CORONARY ANGIOGRAPHY N/A 09/10/2018   Procedure: RIGHT/LEFT  HEART CATH AND CORONARY ANGIOGRAPHY;  Surgeon: Lorretta Harp, MD;  Location: [facility];  Service: Cardiovascular;  Laterality: N/A;  . TOTAL KNEE ARTHROPLASTY Right 07/24/2017   Procedure: RIGHT TOTAL KNEE ARTHROPLASTY;  Surgeon: Gaynelle Arabian, MD;  Location: [facility];  Service: Orthopedics;  Laterality: Right;  Adductor Block    Family History  Problem Relation Age of Onset  . Heart disease Mother   . Heart disease Father   . Colon cancer Neg Hx     Social History Social History   Tobacco Use  . Smoking status: Former Smoker    Packs/day: 0.80    Years: 20.00    Pack years: 16.00    Types: Cigarettes    Last attempt to quit: 09/21/1987    Years since quitting: 31.0  . Smokeless tobacco: Never Used  . Tobacco comment: socially  Substance Use Topics  . Alcohol use: No  . Drug use: No    Current Outpatient Medications  Medication Sig Dispense Refill  . acetaminophen (TYLENOL) 650 MG CR tablet Take 1,300 mg by mouth every 8 (eight) hours as needed for pain.    Marland Kitchen allopurinol (ZYLOPRIM) 100 MG tablet Take 100 mg by mouth daily.    Marland Kitchen amLODipine (NORVASC) 10 MG tablet Take 5 mg by mouth at  bedtime.     . Cholecalciferol (VITAMIN D) 2000 units tablet Take 1 tablet by mouth daily.    Marland Kitchen dicyclomine (BENTYL) 10 MG capsule Take 10 mg by mouth 4 (four) times daily as needed for spasms.     . hydrALAZINE (APRESOLINE) 10 MG tablet Take 10 mg by mouth 2 (two) times daily.    . lansoprazole (PREVACID) 30 MG capsule Take 30 mg by mouth daily as needed (heartburn).     Marland Kitchen levothyroxine (SYNTHROID, LEVOTHROID) 125 MCG tablet Take 125 mcg by mouth daily before breakfast.    . Liniments (SALONPAS PAIN RELIEF PATCH EX) Apply 1 patch topically daily as needed (pain).    Marland Kitchen losartan-hydrochlorothiazide (HYZAAR) 100-12.5 MG tablet Take 1 tablet by mouth every evening.     . meclizine (ANTIVERT) 25 MG tablet Take 25 mg by mouth 3 (three) times daily as needed for dizziness.     Marland Kitchen Propylene  Glycol (SYSTANE BALANCE) 0.6 % SOLN Place 1 drop into both eyes 2 (two) times daily as needed (dry eyes).    . traMADol (ULTRAM) 50 MG tablet Take 1-2 tablets (50-100 mg total) by mouth every 6 (six) hours as needed for moderate pain. 56 tablet 0  . trimethoprim (TRIMPEX) 100 MG tablet Take 100 mg by mouth daily as needed (For UTI Prevention).      No current facility-administered medications for this visit.     Allergies  Allergen Reactions  . Amoxicillin Rash  . Amoxicillin-Pot Clavulanate Rash    Has patient had a PCN reaction causing immediate rash, facial/tongue/throat swelling, SOB or lightheadedness with hypotension: No Has patient had a PCN reaction causing severe rash involving mucus membranes or skin necrosis: No Has patient had a PCN reaction that required hospitalization: No Has patient had a PCN reaction occurring within the last 10 years: No If all of the above answers are "NO", then may proceed with Cephalosporin use.   . Ampicillin Rash  . Clarithromycin Rash  . Clopidogrel Bisulfate Rash  . Nitrofurantoin Itching  . Plavix [Clopidogrel Bisulfate] Rash  . Zestril [Lisinopril] Rash    Review of Systems  Constitutional: Positive for activity change and fatigue. Negative for unexpected weight change.  HENT: Negative for trouble swallowing and voice change.   Eyes: Negative for visual disturbance.  Respiratory: Positive for shortness of breath. Negative for cough and wheezing.   Cardiovascular: Positive for leg swelling. Negative for chest pain and palpitations.  Gastrointestinal: Positive for abdominal pain (Reflux) and diarrhea.  Genitourinary: Negative for difficulty urinating and dysuria.       History of kidney stones and UTIs  Musculoskeletal: Positive for arthralgias and joint swelling.  Skin: Positive for rash.  Neurological: Negative for syncope and weakness.       Asymptomatic mini stroke diagnosed by MR  Hematological: Negative for adenopathy. Does not  bruise/bleed easily.  All other systems reviewed and are negative.   BP 135/90 (BP Location: Left Arm, Patient Position: Sitting, Cuff Size: Large)   Pulse 88   Resp 16   Ht 5\' 5"  (1.651 m)   Wt 222 lb (100.7 kg)   SpO2 97% Comment: RA  BMI 36.94 kg/m  Physical Exam  Constitutional: She is oriented to person, place, and time. No distress.  Obese  HENT:  Head: Normocephalic and atraumatic.  Mouth/Throat: No oropharyngeal exudate.  Eyes: Pupils are equal, round, and reactive to light. Conjunctivae and EOM are normal. No scleral icterus.  Neck: Neck supple. No thyromegaly present.  Transmitted murmur  versus bruits bilaterally  Cardiovascular: Normal rate, regular rhythm and intact distal pulses. Exam reveals no gallop and no friction rub.  Murmur (3/6 crescendo decrescendo murmur) heard. Pulmonary/Chest: Effort normal and breath sounds normal. No respiratory distress. She has no wheezes. She has no rales.  Abdominal: Soft. She exhibits no distension. There is no tenderness.  Musculoskeletal: She exhibits no edema or deformity.  Lymphadenopathy:    She has no cervical adenopathy.  Neurological: She is alert and oriented to person, place, and time. No cranial nerve deficit. She exhibits normal muscle tone. Coordination normal.  Skin: Skin is warm and dry.  Vitals reviewed.    Diagnostic Tests: Cardiac catheterization 09/10/2018 Coronary Findings   Diagnostic  Dominance: Right  Left Anterior Descending  Prox LAD lesion 95% stenosed  Prox LAD lesion is 95% stenosed. The lesion is calcified.  Right Coronary Artery  Mid RCA lesion 100% stenosed  Mid RCA lesion is 100% stenosed.  Intervention   No interventions have been documented.  Right Heart   Right Heart Pressures Hemodynamic findings consistent with aortic valve stenosis. Right atrial pressure- 9/5 Right ventricular pressure- 33/1 Pulmonary artery pressure- 35/8, mean 18 Pulmonary wedge pressure- A-wave 15, V wave 11,  mean 11 Aortic valve pressure gradient- 32 mmHg Aortic valve area- 1.43 cm with an index 0.69cm/m Cardiac output-8.15 L/min by Fick [id number]-14  Echocardiogram 01/30/2018 Study Conclusions  - Left ventricle: The cavity size was normal. There was mild   concentric hypertrophy. Systolic function was vigorous. The   estimated ejection fraction was in the range of 65% to 70%. Wall   motion was normal; there were no regional wall motion   abnormalities. Doppler parameters are consistent with abnormal   left ventricular relaxation (grade 1 diastolic dysfunction).   Doppler parameters are consistent with elevated mean left atrial   filling pressure. - Aortic valve: Right coronary and left coronary cusp mobility was   severely restricted. There was moderate to severe stenosis. - Mitral valve: There was mild regurgitation.  Impressions:  - Compared to January 2018, aortic stenosis has worsened and may be   severe, but the jet remains early peaking (suggesting   non-critical stenosis). There are now signs of elevated mean left   atrial pressure. I personally reviewed the catheterization images and concur with the findings noted above  Impression: [name] [name] is a 72 year old woman with a strong family history of cardiac disease, as well as a history of obesity, hypertension, hyperlipidemia, reflux, arthritis, gout, and a remote TIA-asymptomatic.  She has been followed for known aortic stenosis.  Earlier this year she had an echocardiogram which showed some progression of disease.  She has been having worsening exertional shortness of breath.  She does not have any chest pain or syncope.  She was being evaluated by Dr. Gwenlyn Found with consideration for TAVR.  He did cardiac catheterization and she was found to have total occlusion of her right coronary and a 95% proximal LAD stenosis.  She now is referred for aortic valve replacement and coronary artery bypass grafting.  Coronary bypass grafting  and aortic valve replacement are indicated for survival benefit and relief of symptoms.  We discussed valve options including mechanical versus tissue valves.  Given her age tissue valve would be the appropriate choice.  She is in agreement with that.  I discussed the proposed operation with Ms. Bunkley and her daughter.  We discussed the general nature of the procedure including the need for general anesthesia, the use of cardiopulmonary bypass,  the incisions to be used, and the use of drainage tubes postoperatively. We discussed the expected hospital stay, overall recovery and short and long term outcomes.  I reviewed the indications, risks, benefits, and alternatives.  They understand the risks include, but are not limited to death, stroke, MI, DVT/PE, bleeding, possible need for transfusion, infections, cardiac arrhythmias, complete heart block requiring permanent pacemaker placement, as well as other organ system dysfunction including respiratory, renal, or GI complications.  She accepts the risks and agrees to proceed.  She wishes to proceed as soon as possible.  We will plan for surgery on Monday, 09/20/2018.  On reviewing her catheterization images there is some eggshell-like calcification of the aorta and the aortic root.  I want to get a CT of the chest without contrast to evaluate for aortic calcification to make sure that would not alter our surgical plan.  Plan: CT chest without contrast to evaluate aortic calcification Aortic valve replacement and coronary bypass grafting x2 on Monday, 09/24/2018  Melrose Nakayama, MD [facility] [phone]

## 2018-09-21 ENCOUNTER — Encounter (HOSPITAL_COMMUNITY)
Admission: RE | Admit: 2018-09-21 | Discharge: 2018-09-21 | Disposition: A | Discharge status: Home / Self Care | Payer: PPO | Source: Ambulatory Visit | Attending: Thoracic Surgery (Cardiothoracic Vascular Surgery) | Admitting: Thoracic Surgery (Cardiothoracic Vascular Surgery)

## 2018-09-21 ENCOUNTER — Ambulatory Visit (HOSPITAL_COMMUNITY)
Admission: RE | Admit: 2018-09-21 | Discharge: 2018-09-21 | Disposition: A | Discharge status: Home / Self Care | Payer: PPO | Source: Ambulatory Visit | Attending: Thoracic Surgery (Cardiothoracic Vascular Surgery) | Admitting: Thoracic Surgery (Cardiothoracic Vascular Surgery)

## 2018-09-21 ENCOUNTER — Other Ambulatory Visit: Payer: Self-pay | Admitting: *Deleted

## 2018-09-21 ENCOUNTER — Other Ambulatory Visit: Payer: Self-pay

## 2018-09-21 ENCOUNTER — Encounter (HOSPITAL_COMMUNITY): Payer: Self-pay

## 2018-09-21 ENCOUNTER — Ambulatory Visit
Admission: RE | Admit: 2018-09-21 | Discharge: 2018-09-21 | Disposition: A | Discharge status: Home / Self Care | Payer: PPO | Source: Ambulatory Visit | Attending: Thoracic Surgery (Cardiothoracic Vascular Surgery) | Admitting: Thoracic Surgery (Cardiothoracic Vascular Surgery)

## 2018-09-21 ENCOUNTER — Ambulatory Visit (HOSPITAL_BASED_OUTPATIENT_CLINIC_OR_DEPARTMENT_OTHER)
Admission: RE | Admit: 2018-09-21 | Discharge: 2018-09-21 | Disposition: A | Discharge status: Home / Self Care | Payer: PPO | Source: Ambulatory Visit | Attending: Thoracic Surgery (Cardiothoracic Vascular Surgery) | Admitting: Thoracic Surgery (Cardiothoracic Vascular Surgery)

## 2018-09-21 DIAGNOSIS — Z01818 Encounter for other preprocedural examination: Secondary | ICD-10-CM | POA: Insufficient documentation

## 2018-09-21 DIAGNOSIS — Y713 Surgical instruments, materials and cardiovascular devices (including sutures) associated with adverse incidents: Secondary | ICD-10-CM | POA: Diagnosis not present

## 2018-09-21 DIAGNOSIS — Z79890 Hormone replacement therapy (postmenopausal): Secondary | ICD-10-CM

## 2018-09-21 DIAGNOSIS — I25810 Atherosclerosis of coronary artery bypass graft(s) without angina pectoris: Secondary | ICD-10-CM | POA: Diagnosis not present

## 2018-09-21 DIAGNOSIS — E785 Hyperlipidemia, unspecified: Secondary | ICD-10-CM | POA: Insufficient documentation

## 2018-09-21 DIAGNOSIS — I10 Essential (primary) hypertension: Secondary | ICD-10-CM

## 2018-09-21 DIAGNOSIS — G43909 Migraine, unspecified, not intractable, without status migrainosus: Secondary | ICD-10-CM | POA: Diagnosis not present

## 2018-09-21 DIAGNOSIS — J984 Other disorders of lung: Secondary | ICD-10-CM

## 2018-09-21 DIAGNOSIS — E8881 Metabolic syndrome: Secondary | ICD-10-CM | POA: Diagnosis not present

## 2018-09-21 DIAGNOSIS — E079 Disorder of thyroid, unspecified: Secondary | ICD-10-CM

## 2018-09-21 DIAGNOSIS — D696 Thrombocytopenia, unspecified: Secondary | ICD-10-CM | POA: Diagnosis not present

## 2018-09-21 DIAGNOSIS — Z8249 Family history of ischemic heart disease and other diseases of the circulatory system: Secondary | ICD-10-CM | POA: Insufficient documentation

## 2018-09-21 DIAGNOSIS — I350 Nonrheumatic aortic (valve) stenosis: Secondary | ICD-10-CM

## 2018-09-21 DIAGNOSIS — I951 Orthostatic hypotension: Secondary | ICD-10-CM | POA: Diagnosis present

## 2018-09-21 DIAGNOSIS — I97791 Other intraoperative cardiac functional disturbances during other surgery: Secondary | ICD-10-CM | POA: Diagnosis not present

## 2018-09-21 DIAGNOSIS — Y832 Surgical operation with anastomosis, bypass or graft as the cause of abnormal reaction of the patient, or of later complication, without mention of misadventure at the time of the procedure: Secondary | ICD-10-CM | POA: Diagnosis not present

## 2018-09-21 DIAGNOSIS — J90 Pleural effusion, not elsewhere classified: Secondary | ICD-10-CM | POA: Diagnosis not present

## 2018-09-21 DIAGNOSIS — Z96651 Presence of right artificial knee joint: Secondary | ICD-10-CM | POA: Diagnosis present

## 2018-09-21 DIAGNOSIS — R911 Solitary pulmonary nodule: Secondary | ICD-10-CM | POA: Diagnosis present

## 2018-09-21 DIAGNOSIS — E8770 Fluid overload, unspecified: Secondary | ICD-10-CM | POA: Diagnosis not present

## 2018-09-21 DIAGNOSIS — I2510 Atherosclerotic heart disease of native coronary artery without angina pectoris: Secondary | ICD-10-CM

## 2018-09-21 DIAGNOSIS — Z4682 Encounter for fitting and adjustment of non-vascular catheter: Secondary | ICD-10-CM | POA: Diagnosis not present

## 2018-09-21 DIAGNOSIS — Z79899 Other long term (current) drug therapy: Secondary | ICD-10-CM

## 2018-09-21 DIAGNOSIS — G5621 Lesion of ulnar nerve, right upper limb: Secondary | ICD-10-CM

## 2018-09-21 DIAGNOSIS — G5622 Lesion of ulnar nerve, left upper limb: Secondary | ICD-10-CM

## 2018-09-21 DIAGNOSIS — I4892 Unspecified atrial flutter: Secondary | ICD-10-CM | POA: Diagnosis not present

## 2018-09-21 DIAGNOSIS — R42 Dizziness and giddiness: Secondary | ICD-10-CM | POA: Diagnosis not present

## 2018-09-21 DIAGNOSIS — J9811 Atelectasis: Secondary | ICD-10-CM | POA: Diagnosis not present

## 2018-09-21 DIAGNOSIS — K219 Gastro-esophageal reflux disease without esophagitis: Secondary | ICD-10-CM

## 2018-09-21 DIAGNOSIS — D62 Acute posthemorrhagic anemia: Secondary | ICD-10-CM | POA: Diagnosis not present

## 2018-09-21 DIAGNOSIS — I700 Atherosclerosis of aorta: Secondary | ICD-10-CM

## 2018-09-21 DIAGNOSIS — I358 Other nonrheumatic aortic valve disorders: Secondary | ICD-10-CM | POA: Diagnosis not present

## 2018-09-21 DIAGNOSIS — Z6838 Body mass index (BMI) 38.0-38.9, adult: Secondary | ICD-10-CM | POA: Diagnosis not present

## 2018-09-21 DIAGNOSIS — E039 Hypothyroidism, unspecified: Secondary | ICD-10-CM | POA: Diagnosis present

## 2018-09-21 DIAGNOSIS — M109 Gout, unspecified: Secondary | ICD-10-CM | POA: Diagnosis present

## 2018-09-21 DIAGNOSIS — Z8673 Personal history of transient ischemic attack (TIA), and cerebral infarction without residual deficits: Secondary | ICD-10-CM | POA: Diagnosis not present

## 2018-09-21 DIAGNOSIS — Y92234 Operating room of hospital as the place of occurrence of the external cause: Secondary | ICD-10-CM | POA: Diagnosis not present

## 2018-09-21 DIAGNOSIS — I340 Nonrheumatic mitral (valve) insufficiency: Secondary | ICD-10-CM | POA: Diagnosis not present

## 2018-09-21 DIAGNOSIS — I4891 Unspecified atrial fibrillation: Secondary | ICD-10-CM | POA: Diagnosis not present

## 2018-09-21 DIAGNOSIS — E876 Hypokalemia: Secondary | ICD-10-CM | POA: Diagnosis not present

## 2018-09-21 HISTORY — DX: Claustrophobia: F40.240

## 2018-09-21 HISTORY — DX: Personal history of other diseases of the digestive system: Z87.19

## 2018-09-21 HISTORY — DX: Atherosclerotic heart disease of native coronary artery without angina pectoris: I25.10

## 2018-09-21 HISTORY — DX: Irritable bowel syndrome, unspecified: K58.9

## 2018-09-21 HISTORY — DX: Unspecified osteoarthritis, unspecified site: M19.90

## 2018-09-21 HISTORY — DX: Malignant (primary) neoplasm, unspecified: C80.1

## 2018-09-21 HISTORY — DX: Diverticulosis of intestine, part unspecified, without perforation or abscess without bleeding: K57.90

## 2018-09-21 LAB — COMPREHENSIVE METABOLIC PANEL
ALK PHOS: 93 U/L (ref 38–126)
ALT: 23 U/L (ref 0–44)
AST: 24 U/L (ref 15–41)
Albumin: 3.9 g/dL (ref 3.5–5.0)
Anion gap: 11 (ref 5–15)
BUN: 19 mg/dL (ref 8–23)
CALCIUM: 9.6 mg/dL (ref 8.9–10.3)
CO2: 23 mmol/L (ref 22–32)
Chloride: 103 mmol/L (ref 98–111)
Creatinine, Ser: 0.78 mg/dL (ref 0.44–1.00)
Glucose, Bld: 124 mg/dL — ABNORMAL HIGH (ref 70–99)
Potassium: 3.3 mmol/L — ABNORMAL LOW (ref 3.5–5.1)
SODIUM: 137 mmol/L (ref 135–145)
Total Bilirubin: 0.5 mg/dL (ref 0.3–1.2)
Total Protein: 7.5 g/dL (ref 6.5–8.1)

## 2018-09-21 LAB — BLOOD GAS, ARTERIAL
Acid-Base Excess: 1.7 mmol/L (ref 0.0–2.0)
Bicarbonate: 25.7 mmol/L (ref 20.0–28.0)
Drawn by: 421801
FIO2: 21
O2 SAT: 96.2 %
PATIENT TEMPERATURE: 98.6
PO2 ART: 85.1 mmHg (ref 83.0–108.0)
pCO2 arterial: 40.6 mmHg (ref 32.0–48.0)
pH, Arterial: 7.418 (ref 7.350–7.450)

## 2018-09-21 LAB — CBC
HCT: 42.9 % (ref 36.0–46.0)
Hemoglobin: 13.5 g/dL (ref 12.0–15.0)
MCH: 27.8 pg (ref 26.0–34.0)
MCHC: 31.5 g/dL (ref 30.0–36.0)
MCV: 88.5 fL (ref 78.0–100.0)
Platelets: 256 10*3/uL (ref 150–400)
RBC: 4.85 MIL/uL (ref 3.87–5.11)
RDW: 14.2 % (ref 11.5–15.5)
WBC: 9.7 10*3/uL (ref 4.0–10.5)

## 2018-09-21 LAB — TYPE AND SCREEN
ABO/RH(D): O POS
ANTIBODY SCREEN: NEGATIVE

## 2018-09-21 LAB — HEMOGLOBIN A1C
HEMOGLOBIN A1C: 5.8 % — AB (ref 4.8–5.6)
MEAN PLASMA GLUCOSE: 119.76 mg/dL

## 2018-09-21 LAB — URINALYSIS, ROUTINE W REFLEX MICROSCOPIC
BILIRUBIN URINE: NEGATIVE
Glucose, UA: NEGATIVE mg/dL
Hgb urine dipstick: NEGATIVE
Ketones, ur: NEGATIVE mg/dL
LEUKOCYTES UA: NEGATIVE
NITRITE: NEGATIVE
PH: 5 (ref 5.0–8.0)
Protein, ur: NEGATIVE mg/dL
SPECIFIC GRAVITY, URINE: 1.024 (ref 1.005–1.030)

## 2018-09-21 LAB — SURGICAL PCR SCREEN
MRSA, PCR: NEGATIVE
STAPHYLOCOCCUS AUREUS: NEGATIVE

## 2018-09-21 LAB — APTT: APTT: 28 s (ref 24–36)

## 2018-09-21 LAB — ABO/RH: ABO/RH(D): O POS

## 2018-09-21 LAB — PROTIME-INR
INR: 0.95
PROTHROMBIN TIME: 12.6 s (ref 11.4–15.2)

## 2018-09-21 NOTE — Progress Notes (Signed)
Pre-op Cardiac Surgery  Carotid Findings:   Bilateral ICAs 1-39% stenosis based on velocities. Bilateral vertebral arteries patent with antegrade flow.   Upper Extremity Right Left  Brachial Pressures 155 159  Radial Waveforms Triphasic Triphasic  Ulnar Waveforms Biphasic Biphasic  Palmar Arch (Allen's Test) See below See below   Findings:   Right Upper Extremity: Doppler waveforms remain within normal limits with right radial compression. Doppler waveforms decrease >50% with right ulnar compression.  Left Upper Extremity: Doppler waveforms decrease 50% with left radial compression. Doppler waveform obliterate with left ulnar compression.    Lower  Extremity Right Left  Dorsalis Pedis Biphasic  Biphasic  Posterior Tibial Triphasic Triphasic  Ankle/Brachial Indices 1.04 0.98    Findings:   Resting Bilateral ankle-brachial indecis are within normal range. No evidence of significant bilateral lower extremities arterial disease.   Hongying Landry Mellow (RDMS RVT) 09/21/18 5:44 PM

## 2018-09-21 NOTE — Pre-Procedure Instructions (Signed)
MAKAYLIA HEWETT  09/21/2018    Your procedure is scheduled on Monday, September 24, 2018 at 7:30 AM.   Report to [facility] Entrance "A" Admitting Office at 5:30 AM.   Call this number if you have problems the morning of surgery: [phone]   Remember:  Do not eat or drink after midnight Sunday, 09/23/18.  Take these medicines the morning of surgery with A SIP OF WATER: Hydralazine (Apresoline), Levothyroxine (Synthroid), Lansoprazole (Prevacid) - if needed, Meclizine (Antivert) - if needed, Tramadol or Tylenol - if needed, may use eye drops if needed  Do not use Aspirin products or NSAIDS (Ibuprofen, Aleve, etc) prior to surgery.    Do not wear jewelry, make-up or nail polish.  Do not wear lotions, powders, perfumes or deodorant.  Do not shave 48 hours prior to surgery.    Do not bring valuables to the hospital.  [facility] is not responsible for any belongings or valuables.  Contacts, dentures or bridgework may not be worn into surgery.  Leave your suitcase in the car.  After surgery it may be brought to your room.  For patients admitted to the hospital, discharge time will be determined by your treatment team.   - Preparing for Surgery  Before surgery, you can play an important role.  Because skin is not sterile, your skin needs to be as free of germs as possible.  You can reduce the number of germs on you skin by washing with CHG (chlorahexidine gluconate) soap before surgery.  CHG is an antiseptic cleaner which kills germs and bonds with the skin to continue killing germs even after washing.  Oral Hygiene is also important in reducing the risk of infection.  Remember to brush your teeth with your regular toothpaste the morning of surgery.  Please DO NOT use if you have an allergy to CHG or antibacterial soaps.  If your skin becomes reddened/irritated stop using the CHG and inform your nurse when you arrive at Short Stay.  Do not shave (including legs and  underarms) for at least 48 hours prior to the first CHG shower.  You may shave your face.  Please follow these instructions carefully:   1.  Shower with CHG Soap the night before surgery and the morning of Surgery.  2.  If you choose to wash your hair, wash your hair first as usual with your normal shampoo.  3.  After you shampoo, rinse your hair and body thoroughly to remove the shampoo. 4.  Use CHG as you would any other liquid soap.  You can apply chg directly to the skin and wash gently with a      scrungie or washcloth.           5.  Apply the CHG Soap to your body ONLY FROM THE NECK DOWN.   Do not use on open wounds or open sores. Avoid contact with your eyes, ears, mouth and genitals (private parts).  Wash genitals (private parts) with your normal soap.  6.  Wash thoroughly, paying special attention to the area where your surgery will be performed.  7.  Thoroughly rinse your body with warm water from the neck down.  8.  DO NOT shower/wash with your normal soap after using and rinsing off the CHG Soap.  9.  Pat yourself dry with a clean towel.            10 .  Wear clean pajamas.  11.  Place clean sheets on your bed the night of your first shower and do not sleep with pets.  Day of Surgery  Shower as above. Do not apply any lotions/deodorants the morning of surgery.   Please wear clean clothes to the hospital. Remember to brush your teeth with toothpaste.   Please read over the fact sheets that you were given.

## 2018-09-21 NOTE — Progress Notes (Signed)
Pt has hx of Aortic Valve stenosis and CAD. Pt denies any recent chest pain or sob. Pt states she is not diabetic.

## 2018-09-23 MED ORDER — LEVOFLOXACIN IN D5W 500 MG/100ML IV SOLN
500.0000 mg | INTRAVENOUS | Status: AC
  Administered 2018-09-24: 500 mg via INTRAVENOUS
  Filled 2018-09-23: qty 100

## 2018-09-23 MED ORDER — PHENYLEPHRINE HCL-NACL 20-0.9 MG/250ML-% IV SOLN
30.0000 ug/min | INTRAVENOUS | Status: AC
  Administered 2018-09-24: 40 ug/min via INTRAVENOUS
  Filled 2018-09-23: qty 250

## 2018-09-23 MED ORDER — DEXMEDETOMIDINE HCL IN NACL 400 MCG/100ML IV SOLN
0.1000 ug/kg/h | INTRAVENOUS | Status: AC
  Administered 2018-09-24: .5 ug/kg/h via INTRAVENOUS
  Filled 2018-09-23: qty 100

## 2018-09-23 MED ORDER — PLASMA-LYTE 148 IV SOLN
INTRAVENOUS | Status: DC
  Filled 2018-09-23: qty 2.5

## 2018-09-23 MED ORDER — TRANEXAMIC ACID (OHS) BOLUS VIA INFUSION
15.0000 mg/kg | INTRAVENOUS | Status: AC
  Administered 2018-09-24: 1510.5 mg via INTRAVENOUS
  Filled 2018-09-23 (×3): qty 1511

## 2018-09-23 MED ORDER — MAGNESIUM SULFATE 50 % IJ SOLN
40.0000 meq | INTRAMUSCULAR | Status: DC
  Filled 2018-09-23 (×3): qty 9.85

## 2018-09-23 MED ORDER — VANCOMYCIN HCL 10 G IV SOLR
1500.0000 mg | INTRAVENOUS | Status: AC
  Administered 2018-09-24: 1500 mg via INTRAVENOUS
  Filled 2018-09-23: qty 1500

## 2018-09-23 MED ORDER — TRANEXAMIC ACID 1000 MG/10ML IV SOLN
1.5000 mg/kg/h | INTRAVENOUS | Status: AC
  Administered 2018-09-24: 1.5 mg/kg/h via INTRAVENOUS
  Filled 2018-09-23: qty 25

## 2018-09-23 MED ORDER — MILRINONE LACTATE IN DEXTROSE 20-5 MG/100ML-% IV SOLN
0.3000 ug/kg/min | INTRAVENOUS | Status: DC
  Filled 2018-09-23: qty 100

## 2018-09-23 MED ORDER — SODIUM CHLORIDE 0.9 % IV SOLN
INTRAVENOUS | Status: DC
  Filled 2018-09-23: qty 30

## 2018-09-23 MED ORDER — EPINEPHRINE PF 1 MG/ML IJ SOLN
0.0000 ug/min | INTRAVENOUS | Status: DC
  Filled 2018-09-23: qty 4

## 2018-09-23 MED ORDER — POTASSIUM CHLORIDE 2 MEQ/ML IV SOLN
80.0000 meq | INTRAVENOUS | Status: DC
  Filled 2018-09-23: qty 40

## 2018-09-23 MED ORDER — SODIUM CHLORIDE 0.9 % IV SOLN
INTRAVENOUS | Status: AC
  Administered 2018-09-24: 1.2 [IU]/h via INTRAVENOUS
  Filled 2018-09-23: qty 1

## 2018-09-23 MED ORDER — TRANEXAMIC ACID (OHS) PUMP PRIME SOLUTION
2.0000 mg/kg | INTRAVENOUS | Status: DC
  Filled 2018-09-23: qty 2.01

## 2018-09-23 MED ORDER — NITROGLYCERIN IN D5W 200-5 MCG/ML-% IV SOLN
2.0000 ug/min | INTRAVENOUS | Status: AC
  Administered 2018-09-24: 16.6 ug/min via INTRAVENOUS
  Filled 2018-09-23: qty 250

## 2018-09-23 MED ORDER — DOPAMINE-DEXTROSE 3.2-5 MG/ML-% IV SOLN
0.0000 ug/kg/min | INTRAVENOUS | Status: AC
  Administered 2018-09-24: 3 ug/kg/min via INTRAVENOUS
  Filled 2018-09-23: qty 250

## 2018-09-23 NOTE — Anesthesia Preprocedure Evaluation (Addendum)
Anesthesia Evaluation  Patient identified by MRN, date of birth, ID band Patient awake    Reviewed: Allergy & Precautions, NPO status , Patient's Chart, lab work & pertinent test results  Airway Mallampati: II  TM Distance: >3 FB Neck ROM: Full    Dental  (+) Dental Advisory Given, Teeth Intact   Pulmonary former smoker,    Pulmonary exam normal breath sounds clear to auscultation       Cardiovascular hypertension, Pt. on medications + CAD  Normal cardiovascular exam+ Valvular Problems/Murmurs (Moderate AS on ECHO 1/19) AS  Rhythm:Regular Rate:Normal     Neuro/Psych PSYCHIATRIC DISORDERS Anxiety negative neurological ROS     GI/Hepatic Neg liver ROS, hiatal hernia, GERD  ,  Endo/Other  Hypothyroidism   Renal/GU negative Renal ROS     Musculoskeletal negative musculoskeletal ROS (+) Arthritis ,   Abdominal   Peds  Hematology negative hematology ROS (+)   Anesthesia Other Findings   Reproductive/Obstetrics negative OB ROS                            Anesthesia Physical  Anesthesia Plan  ASA: IV  Anesthesia Plan: General   Post-op Pain Management:    Induction: Intravenous  PONV Risk Score and Plan: 4 or greater and Midazolam and Treatment may vary due to age or medical condition  Airway Management Planned: Oral ETT  Additional Equipment: Arterial line, CVP, TEE, 3D TEE, PA Cath and Ultrasound Guidance Line Placement  Intra-op Plan:   Post-operative Plan: Post-operative intubation/ventilation  Informed Consent: I have reviewed the patients History and Physical, chart, labs and discussed the procedure including the risks, benefits and alternatives for the proposed anesthesia with the patient or authorized representative who has indicated his/her understanding and acceptance.   Dental advisory given  Plan Discussed with: CRNA, Anesthesiologist and Surgeon  Anesthesia Plan  Comments:        Anesthesia Quick Evaluation

## 2018-09-24 ENCOUNTER — Encounter (HOSPITAL_COMMUNITY)
Admission: RE | Disposition: A | Discharge status: Home / Self Care | Payer: Self-pay | Source: Ambulatory Visit | Attending: Thoracic Surgery (Cardiothoracic Vascular Surgery)

## 2018-09-24 ENCOUNTER — Inpatient Hospital Stay (HOSPITAL_COMMUNITY)
Admission: RE | Admit: 2018-09-24 | Discharge: 2018-09-30 | DRG: 220 | Disposition: A | Discharge status: Home / Self Care | Payer: PPO | Source: Ambulatory Visit | Attending: Thoracic Surgery (Cardiothoracic Vascular Surgery) | Admitting: Thoracic Surgery (Cardiothoracic Vascular Surgery)

## 2018-09-24 ENCOUNTER — Encounter (HOSPITAL_COMMUNITY): Payer: Self-pay | Admitting: Certified Registered"

## 2018-09-24 ENCOUNTER — Other Ambulatory Visit: Payer: Self-pay

## 2018-09-24 ENCOUNTER — Inpatient Hospital Stay (HOSPITAL_COMMUNITY): Payer: PPO

## 2018-09-24 ENCOUNTER — Inpatient Hospital Stay (HOSPITAL_COMMUNITY): Payer: PPO | Admitting: Certified Registered"

## 2018-09-24 DIAGNOSIS — Z888 Allergy status to other drugs, medicaments and biological substances status: Secondary | ICD-10-CM

## 2018-09-24 DIAGNOSIS — J9811 Atelectasis: Secondary | ICD-10-CM | POA: Diagnosis not present

## 2018-09-24 DIAGNOSIS — I2510 Atherosclerotic heart disease of native coronary artery without angina pectoris: Secondary | ICD-10-CM | POA: Diagnosis present

## 2018-09-24 DIAGNOSIS — Y92234 Operating room of hospital as the place of occurrence of the external cause: Secondary | ICD-10-CM | POA: Diagnosis not present

## 2018-09-24 DIAGNOSIS — Z87891 Personal history of nicotine dependence: Secondary | ICD-10-CM

## 2018-09-24 DIAGNOSIS — R197 Diarrhea, unspecified: Secondary | ICD-10-CM | POA: Diagnosis present

## 2018-09-24 DIAGNOSIS — I700 Atherosclerosis of aorta: Secondary | ICD-10-CM | POA: Diagnosis present

## 2018-09-24 DIAGNOSIS — R109 Unspecified abdominal pain: Secondary | ICD-10-CM | POA: Diagnosis present

## 2018-09-24 DIAGNOSIS — Y713 Surgical instruments, materials and cardiovascular devices (including sutures) associated with adverse incidents: Secondary | ICD-10-CM | POA: Diagnosis not present

## 2018-09-24 DIAGNOSIS — R42 Dizziness and giddiness: Secondary | ICD-10-CM | POA: Diagnosis not present

## 2018-09-24 DIAGNOSIS — G43909 Migraine, unspecified, not intractable, without status migrainosus: Secondary | ICD-10-CM | POA: Diagnosis not present

## 2018-09-24 DIAGNOSIS — Z880 Allergy status to penicillin: Secondary | ICD-10-CM

## 2018-09-24 DIAGNOSIS — K219 Gastro-esophageal reflux disease without esophagitis: Secondary | ICD-10-CM | POA: Diagnosis present

## 2018-09-24 DIAGNOSIS — D62 Acute posthemorrhagic anemia: Secondary | ICD-10-CM | POA: Diagnosis not present

## 2018-09-24 DIAGNOSIS — I350 Nonrheumatic aortic (valve) stenosis: Secondary | ICD-10-CM | POA: Diagnosis present

## 2018-09-24 DIAGNOSIS — Y832 Surgical operation with anastomosis, bypass or graft as the cause of abnormal reaction of the patient, or of later complication, without mention of misadventure at the time of the procedure: Secondary | ICD-10-CM | POA: Diagnosis not present

## 2018-09-24 DIAGNOSIS — E039 Hypothyroidism, unspecified: Secondary | ICD-10-CM | POA: Diagnosis present

## 2018-09-24 DIAGNOSIS — Z8673 Personal history of transient ischemic attack (TIA), and cerebral infarction without residual deficits: Secondary | ICD-10-CM | POA: Diagnosis not present

## 2018-09-24 DIAGNOSIS — Z87442 Personal history of urinary calculi: Secondary | ICD-10-CM

## 2018-09-24 DIAGNOSIS — D696 Thrombocytopenia, unspecified: Secondary | ICD-10-CM | POA: Diagnosis not present

## 2018-09-24 DIAGNOSIS — Z87440 Personal history of urinary (tract) infections: Secondary | ICD-10-CM

## 2018-09-24 DIAGNOSIS — M109 Gout, unspecified: Secondary | ICD-10-CM | POA: Diagnosis present

## 2018-09-24 DIAGNOSIS — I97791 Other intraoperative cardiac functional disturbances during other surgery: Secondary | ICD-10-CM | POA: Diagnosis present

## 2018-09-24 DIAGNOSIS — Z881 Allergy status to other antibiotic agents status: Secondary | ICD-10-CM

## 2018-09-24 DIAGNOSIS — I4892 Unspecified atrial flutter: Secondary | ICD-10-CM | POA: Diagnosis not present

## 2018-09-24 DIAGNOSIS — E876 Hypokalemia: Secondary | ICD-10-CM | POA: Diagnosis not present

## 2018-09-24 DIAGNOSIS — I10 Essential (primary) hypertension: Secondary | ICD-10-CM | POA: Diagnosis present

## 2018-09-24 DIAGNOSIS — I4891 Unspecified atrial fibrillation: Secondary | ICD-10-CM | POA: Diagnosis not present

## 2018-09-24 DIAGNOSIS — I491 Atrial premature depolarization: Secondary | ICD-10-CM | POA: Diagnosis not present

## 2018-09-24 DIAGNOSIS — Z6838 Body mass index (BMI) 38.0-38.9, adult: Secondary | ICD-10-CM | POA: Diagnosis not present

## 2018-09-24 DIAGNOSIS — E8881 Metabolic syndrome: Secondary | ICD-10-CM | POA: Diagnosis present

## 2018-09-24 DIAGNOSIS — Z96651 Presence of right artificial knee joint: Secondary | ICD-10-CM | POA: Diagnosis present

## 2018-09-24 DIAGNOSIS — Z951 Presence of aortocoronary bypass graft: Secondary | ICD-10-CM

## 2018-09-24 DIAGNOSIS — Z8249 Family history of ischemic heart disease and other diseases of the circulatory system: Secondary | ICD-10-CM

## 2018-09-24 DIAGNOSIS — R911 Solitary pulmonary nodule: Secondary | ICD-10-CM | POA: Diagnosis present

## 2018-09-24 DIAGNOSIS — E8770 Fluid overload, unspecified: Secondary | ICD-10-CM | POA: Diagnosis not present

## 2018-09-24 DIAGNOSIS — Z952 Presence of prosthetic heart valve: Secondary | ICD-10-CM

## 2018-09-24 DIAGNOSIS — Z792 Long term (current) use of antibiotics: Secondary | ICD-10-CM

## 2018-09-24 DIAGNOSIS — Z953 Presence of xenogenic heart valve: Secondary | ICD-10-CM

## 2018-09-24 DIAGNOSIS — Z79899 Other long term (current) drug therapy: Secondary | ICD-10-CM

## 2018-09-24 DIAGNOSIS — Z8639 Personal history of other endocrine, nutritional and metabolic disease: Secondary | ICD-10-CM

## 2018-09-24 DIAGNOSIS — I951 Orthostatic hypotension: Secondary | ICD-10-CM | POA: Diagnosis present

## 2018-09-24 HISTORY — PX: AORTIC VALVE REPLACEMENT: SHX41

## 2018-09-24 HISTORY — PX: TEE WITHOUT CARDIOVERSION: SHX5443

## 2018-09-24 HISTORY — PX: CORONARY ARTERY BYPASS GRAFT: SHX141

## 2018-09-24 LAB — POCT I-STAT, CHEM 8
BUN: 16 mg/dL (ref 8–23)
BUN: 12 mg/dL (ref 8–23)
BUN: 14 mg/dL (ref 8–23)
BUN: 13 mg/dL (ref 8–23)
BUN: 13 mg/dL (ref 8–23)
BUN: 12 mg/dL (ref 8–23)
BUN: 9 mg/dL (ref 8–23)
CALCIUM ION: 1.23 mmol/L (ref 1.15–1.40)
CALCIUM ION: 1.08 mmol/L — AB (ref 1.15–1.40)
CHLORIDE: 103 mmol/L (ref 98–111)
CHLORIDE: 102 mmol/L (ref 98–111)
CHLORIDE: 105 mmol/L (ref 98–111)
CHLORIDE: 103 mmol/L (ref 98–111)
CHLORIDE: 104 mmol/L (ref 98–111)
CHLORIDE: 105 mmol/L (ref 98–111)
CHLORIDE: 105 mmol/L (ref 98–111)
CREATININE: 0.5 mg/dL (ref 0.44–1.00)
CREATININE: 0.4 mg/dL — AB (ref 0.44–1.00)
Calcium, Ion: 1.27 mmol/L (ref 1.15–1.40)
Calcium, Ion: 1.02 mmol/L — ABNORMAL LOW (ref 1.15–1.40)
Calcium, Ion: 1.09 mmol/L — ABNORMAL LOW (ref 1.15–1.40)
Calcium, Ion: 1.08 mmol/L — ABNORMAL LOW (ref 1.15–1.40)
Calcium, Ion: 1.11 mmol/L — ABNORMAL LOW (ref 1.15–1.40)
Creatinine, Ser: 0.4 mg/dL — ABNORMAL LOW (ref 0.44–1.00)
Creatinine, Ser: 0.6 mg/dL (ref 0.44–1.00)
Creatinine, Ser: 0.4 mg/dL — ABNORMAL LOW (ref 0.44–1.00)
Creatinine, Ser: 0.4 mg/dL — ABNORMAL LOW (ref 0.44–1.00)
Creatinine, Ser: 0.4 mg/dL — ABNORMAL LOW (ref 0.44–1.00)
GLUCOSE: 121 mg/dL — AB (ref 70–99)
GLUCOSE: 105 mg/dL — AB (ref 70–99)
GLUCOSE: 116 mg/dL — AB (ref 70–99)
GLUCOSE: 140 mg/dL — AB (ref 70–99)
Glucose, Bld: 146 mg/dL — ABNORMAL HIGH (ref 70–99)
Glucose, Bld: 157 mg/dL — ABNORMAL HIGH (ref 70–99)
Glucose, Bld: 138 mg/dL — ABNORMAL HIGH (ref 70–99)
HCT: 33 % — ABNORMAL LOW (ref 36.0–46.0)
HCT: 28 % — ABNORMAL LOW (ref 36.0–46.0)
HCT: 29 % — ABNORMAL LOW (ref 36.0–46.0)
HCT: 25 % — ABNORMAL LOW (ref 36.0–46.0)
HEMATOCRIT: 26 % — AB (ref 36.0–46.0)
HEMATOCRIT: 25 % — AB (ref 36.0–46.0)
HEMATOCRIT: 27 % — AB (ref 36.0–46.0)
HEMOGLOBIN: 8.5 g/dL — AB (ref 12.0–15.0)
Hemoglobin: 11.2 g/dL — ABNORMAL LOW (ref 12.0–15.0)
Hemoglobin: 9.5 g/dL — ABNORMAL LOW (ref 12.0–15.0)
Hemoglobin: 9.9 g/dL — ABNORMAL LOW (ref 12.0–15.0)
Hemoglobin: 8.8 g/dL — ABNORMAL LOW (ref 12.0–15.0)
Hemoglobin: 8.5 g/dL — ABNORMAL LOW (ref 12.0–15.0)
Hemoglobin: 9.2 g/dL — ABNORMAL LOW (ref 12.0–15.0)
POTASSIUM: 3.4 mmol/L — AB (ref 3.5–5.1)
POTASSIUM: 3.5 mmol/L (ref 3.5–5.1)
POTASSIUM: 4.5 mmol/L (ref 3.5–5.1)
Potassium: 3.2 mmol/L — ABNORMAL LOW (ref 3.5–5.1)
Potassium: 3.9 mmol/L (ref 3.5–5.1)
Potassium: 3.6 mmol/L (ref 3.5–5.1)
Potassium: 3.6 mmol/L (ref 3.5–5.1)
SODIUM: 139 mmol/L (ref 135–145)
SODIUM: 137 mmol/L (ref 135–145)
SODIUM: 139 mmol/L (ref 135–145)
SODIUM: 141 mmol/L (ref 135–145)
Sodium: 139 mmol/L (ref 135–145)
Sodium: 139 mmol/L (ref 135–145)
Sodium: 137 mmol/L (ref 135–145)
TCO2: 28 mmol/L (ref 22–32)
TCO2: 25 mmol/L (ref 22–32)
TCO2: 25 mmol/L (ref 22–32)
TCO2: 28 mmol/L (ref 22–32)
TCO2: 26 mmol/L (ref 22–32)
TCO2: 23 mmol/L (ref 22–32)
TCO2: 25 mmol/L (ref 22–32)

## 2018-09-24 LAB — GLUCOSE, CAPILLARY
GLUCOSE-CAPILLARY: 149 mg/dL — AB (ref 70–99)
GLUCOSE-CAPILLARY: 128 mg/dL — AB (ref 70–99)
GLUCOSE-CAPILLARY: 134 mg/dL — AB (ref 70–99)
Glucose-Capillary: 155 mg/dL — ABNORMAL HIGH (ref 70–99)
Glucose-Capillary: 136 mg/dL — ABNORMAL HIGH (ref 70–99)
Glucose-Capillary: 135 mg/dL — ABNORMAL HIGH (ref 70–99)
Glucose-Capillary: 149 mg/dL — ABNORMAL HIGH (ref 70–99)
Glucose-Capillary: 153 mg/dL — ABNORMAL HIGH (ref 70–99)
Glucose-Capillary: 127 mg/dL — ABNORMAL HIGH (ref 70–99)

## 2018-09-24 LAB — CBC
HCT: 33.7 % — ABNORMAL LOW (ref 36.0–46.0)
HCT: 28.7 % — ABNORMAL LOW (ref 36.0–46.0)
HEMOGLOBIN: 10.5 g/dL — AB (ref 12.0–15.0)
Hemoglobin: 9.2 g/dL — ABNORMAL LOW (ref 12.0–15.0)
MCH: 27.6 pg (ref 26.0–34.0)
MCH: 28.3 pg (ref 26.0–34.0)
MCHC: 31.2 g/dL (ref 30.0–36.0)
MCHC: 32.1 g/dL (ref 30.0–36.0)
MCV: 88.7 fL (ref 78.0–100.0)
MCV: 88.3 fL (ref 78.0–100.0)
PLATELETS: 164 10*3/uL (ref 150–400)
PLATELETS: 150 10*3/uL (ref 150–400)
RBC: 3.8 MIL/uL — AB (ref 3.87–5.11)
RBC: 3.25 MIL/uL — ABNORMAL LOW (ref 3.87–5.11)
RDW: 14 % (ref 11.5–15.5)
RDW: 13.9 % (ref 11.5–15.5)
WBC: 21.7 10*3/uL — AB (ref 4.0–10.5)
WBC: 15.2 10*3/uL — AB (ref 4.0–10.5)

## 2018-09-24 LAB — POCT I-STAT 3, ART BLOOD GAS (G3+)
Acid-Base Excess: 2 mmol/L (ref 0.0–2.0)
Acid-base deficit: 1 mmol/L (ref 0.0–2.0)
Acid-base deficit: 3 mmol/L — ABNORMAL HIGH (ref 0.0–2.0)
Acid-base deficit: 1 mmol/L (ref 0.0–2.0)
Acid-base deficit: 2 mmol/L (ref 0.0–2.0)
BICARBONATE: 26.8 mmol/L (ref 20.0–28.0)
BICARBONATE: 23.8 mmol/L (ref 20.0–28.0)
BICARBONATE: 22.7 mmol/L (ref 20.0–28.0)
Bicarbonate: 22.8 mmol/L (ref 20.0–28.0)
Bicarbonate: 24.4 mmol/L (ref 20.0–28.0)
O2 SAT: 97 %
O2 Saturation: 100 %
O2 Saturation: 99 %
O2 Saturation: 99 %
O2 Saturation: 97 %
PCO2 ART: 38.8 mmHg (ref 32.0–48.0)
PH ART: 7.432 (ref 7.350–7.450)
PO2 ART: 145 mmHg — AB (ref 83.0–108.0)
PO2 ART: 91 mmHg (ref 83.0–108.0)
Patient temperature: 36.6
TCO2: 28 mmol/L (ref 22–32)
TCO2: 25 mmol/L (ref 22–32)
TCO2: 24 mmol/L (ref 22–32)
TCO2: 26 mmol/L (ref 22–32)
TCO2: 24 mmol/L (ref 22–32)
pCO2 arterial: 40.2 mmHg (ref 32.0–48.0)
pCO2 arterial: 41.5 mmHg (ref 32.0–48.0)
pCO2 arterial: 55 mmHg — ABNORMAL HIGH (ref 32.0–48.0)
pCO2 arterial: 41.7 mmHg (ref 32.0–48.0)
pH, Arterial: 7.366 (ref 7.350–7.450)
pH, Arterial: 7.246 — ABNORMAL LOW (ref 7.350–7.450)
pH, Arterial: 7.374 (ref 7.350–7.450)
pH, Arterial: 7.373 (ref 7.350–7.450)
pO2, Arterial: 421 mmHg — ABNORMAL HIGH (ref 83.0–108.0)
pO2, Arterial: 123 mmHg — ABNORMAL HIGH (ref 83.0–108.0)
pO2, Arterial: 119 mmHg — ABNORMAL HIGH (ref 83.0–108.0)

## 2018-09-24 LAB — PLATELET COUNT: PLATELETS: 116 10*3/uL — AB (ref 150–400)

## 2018-09-24 LAB — APTT: aPTT: 31 seconds (ref 24–36)

## 2018-09-24 LAB — PROTIME-INR
INR: 1.46
PROTHROMBIN TIME: 17.6 s — AB (ref 11.4–15.2)

## 2018-09-24 LAB — CREATININE, SERUM: Creatinine, Ser: 0.48 mg/dL (ref 0.44–1.00)

## 2018-09-24 LAB — HEMOGLOBIN AND HEMATOCRIT, BLOOD
HEMATOCRIT: 26.1 % — AB (ref 36.0–46.0)
HEMOGLOBIN: 8.2 g/dL — AB (ref 12.0–15.0)

## 2018-09-24 LAB — POCT I-STAT 4, (NA,K, GLUC, HGB,HCT)
Glucose, Bld: 154 mg/dL — ABNORMAL HIGH (ref 70–99)
HCT: 31 % — ABNORMAL LOW (ref 36.0–46.0)
HEMOGLOBIN: 10.5 g/dL — AB (ref 12.0–15.0)
POTASSIUM: 3.4 mmol/L — AB (ref 3.5–5.1)
SODIUM: 141 mmol/L (ref 135–145)

## 2018-09-24 LAB — MAGNESIUM: MAGNESIUM: 3 mg/dL — AB (ref 1.7–2.4)

## 2018-09-24 SURGERY — REPLACEMENT, AORTIC VALVE, OPEN
Anesthesia: General | Site: Chest

## 2018-09-24 MED ORDER — VECURONIUM BROMIDE 10 MG IV SOLR
INTRAVENOUS | Status: AC
  Filled 2018-09-24: qty 10

## 2018-09-24 MED ORDER — LEVOFLOXACIN IN D5W 750 MG/150ML IV SOLN
750.0000 mg | INTRAVENOUS | Status: AC
  Administered 2018-09-25: 750 mg via INTRAVENOUS
  Filled 2018-09-24: qty 150

## 2018-09-24 MED ORDER — 0.9 % SODIUM CHLORIDE (POUR BTL) OPTIME
TOPICAL | Status: DC | PRN
  Administered 2018-09-24: 1000 mL
  Administered 2018-09-24: 5000 mL

## 2018-09-24 MED ORDER — ALLOPURINOL 100 MG PO TABS
100.0000 mg | ORAL_TABLET | Freq: Every day | ORAL | Status: DC
  Administered 2018-09-25 – 2018-09-30 (×6): 100 mg via ORAL
  Filled 2018-09-24 (×6): qty 1

## 2018-09-24 MED ORDER — EPHEDRINE SULFATE 50 MG/ML IJ SOLN: INTRAMUSCULAR | Status: DC | PRN

## 2018-09-24 MED ORDER — EPHEDRINE 5 MG/ML INJ
INTRAVENOUS | Status: AC
  Filled 2018-09-24: qty 10

## 2018-09-24 MED ORDER — LEVOTHYROXINE SODIUM 25 MCG PO TABS
125.0000 ug | ORAL_TABLET | Freq: Every day | ORAL | Status: DC
  Administered 2018-09-25 – 2018-09-30 (×6): 125 ug via ORAL
  Filled 2018-09-24 (×6): qty 1

## 2018-09-24 MED ORDER — ORAL CARE MOUTH RINSE
15.0000 mL | Freq: Two times a day (BID) | OROMUCOSAL | Status: DC
  Administered 2018-09-24 – 2018-09-26 (×4): 15 mL via OROMUCOSAL

## 2018-09-24 MED ORDER — DEXMEDETOMIDINE HCL IN NACL 200 MCG/50ML IV SOLN
0.0000 ug/kg/h | INTRAVENOUS | Status: DC
  Administered 2018-09-24: 0.7 ug/kg/h via INTRAVENOUS
  Administered 2018-09-24: 0.1 ug/kg/h via INTRAVENOUS
  Filled 2018-09-24 (×2): qty 50

## 2018-09-24 MED ORDER — HEMOSTATIC AGENTS (NO CHARGE) OPTIME
TOPICAL | Status: DC | PRN
  Administered 2018-09-24: 1 via TOPICAL

## 2018-09-24 MED ORDER — ALBUMIN HUMAN 5 % IV SOLN
INTRAVENOUS | Status: DC | PRN
  Administered 2018-09-24: 13:00:00 via INTRAVENOUS

## 2018-09-24 MED ORDER — PROTAMINE SULFATE 10 MG/ML IV SOLN
INTRAVENOUS | Status: DC | PRN
  Administered 2018-09-24: 180 mg via INTRAVENOUS
  Administered 2018-09-24: 160 mg via INTRAVENOUS
  Administered 2018-09-24: 20 mg via INTRAVENOUS

## 2018-09-24 MED ORDER — SODIUM BICARBONATE 8.4 % IV SOLN
50.0000 meq | Freq: Once | INTRAVENOUS | Status: AC
  Administered 2018-09-24: 50 meq via INTRAVENOUS

## 2018-09-24 MED ORDER — LACTATED RINGERS IV SOLN: 500.0000 mL | Freq: Once | INTRAVENOUS | Status: DC | PRN

## 2018-09-24 MED ORDER — PROTAMINE SULFATE 10 MG/ML IV SOLN
INTRAVENOUS | Status: AC
  Filled 2018-09-24: qty 25

## 2018-09-24 MED ORDER — PROTAMINE SULFATE 10 MG/ML IV SOLN
INTRAVENOUS | Status: AC
  Filled 2018-09-24: qty 5

## 2018-09-24 MED ORDER — SODIUM CHLORIDE 0.9% FLUSH: 10.0000 mL | INTRAVENOUS | Status: DC | PRN

## 2018-09-24 MED ORDER — BISACODYL 5 MG PO TBEC
10.0000 mg | DELAYED_RELEASE_TABLET | Freq: Every day | ORAL | Status: DC
  Administered 2018-09-25 – 2018-09-28 (×4): 10 mg via ORAL
  Filled 2018-09-24 (×5): qty 2

## 2018-09-24 MED ORDER — ACETAMINOPHEN 650 MG RE SUPP
650.0000 mg | Freq: Once | RECTAL | Status: AC
  Administered 2018-09-24: 650 mg via RECTAL

## 2018-09-24 MED ORDER — DOPAMINE-DEXTROSE 3.2-5 MG/ML-% IV SOLN
3.0000 ug/kg/min | INTRAVENOUS | Status: DC
  Administered 2018-09-24: 3 ug/kg/min via INTRAVENOUS

## 2018-09-24 MED ORDER — CHLORHEXIDINE GLUCONATE CLOTH 2 % EX PADS
6.0000 | MEDICATED_PAD | Freq: Every day | CUTANEOUS | Status: DC
  Administered 2018-09-24 – 2018-09-26 (×3): 6 via TOPICAL

## 2018-09-24 MED ORDER — LACTATED RINGERS IV SOLN
INTRAVENOUS | Status: DC | PRN
  Administered 2018-09-24 (×2): via INTRAVENOUS

## 2018-09-24 MED ORDER — EPHEDRINE SULFATE-NACL 50-0.9 MG/10ML-% IV SOSY
PREFILLED_SYRINGE | INTRAVENOUS | Status: DC | PRN
  Administered 2018-09-24 (×3): 5 mg via INTRAVENOUS

## 2018-09-24 MED ORDER — CHLORHEXIDINE GLUCONATE 0.12 % MT SOLN: 15.0000 mL | Freq: Once | OROMUCOSAL | Status: DC

## 2018-09-24 MED ORDER — PHENYLEPHRINE 40 MCG/ML (10ML) SYRINGE FOR IV PUSH (FOR BLOOD PRESSURE SUPPORT): PREFILLED_SYRINGE | INTRAVENOUS | Status: DC | PRN

## 2018-09-24 MED ORDER — PHENYLEPHRINE 40 MCG/ML (10ML) SYRINGE FOR IV PUSH (FOR BLOOD PRESSURE SUPPORT)
PREFILLED_SYRINGE | INTRAVENOUS | Status: DC | PRN
  Administered 2018-09-24 (×3): 80 ug via INTRAVENOUS
  Administered 2018-09-24: 40 ug via INTRAVENOUS

## 2018-09-24 MED ORDER — SUFENTANIL CITRATE 250 MCG/5ML IV SOLN
INTRAVENOUS | Status: AC
  Filled 2018-09-24: qty 5

## 2018-09-24 MED ORDER — ALBUMIN HUMAN 5 % IV SOLN
250.0000 mL | INTRAVENOUS | Status: AC | PRN
  Administered 2018-09-24 (×3): 12.5 g via INTRAVENOUS
  Filled 2018-09-24 (×3): qty 250

## 2018-09-24 MED ORDER — PHENYLEPHRINE HCL 10 MG/ML IJ SOLN: INTRAMUSCULAR | Status: DC | PRN

## 2018-09-24 MED ORDER — METOPROLOL TARTRATE 5 MG/5ML IV SOLN: 2.5000 mg | INTRAVENOUS | Status: DC | PRN

## 2018-09-24 MED ORDER — POTASSIUM CHLORIDE 10 MEQ/50ML IV SOLN
10.0000 meq | Freq: Once | INTRAVENOUS | Status: AC
  Administered 2018-09-24: 10 meq via INTRAVENOUS
  Filled 2018-09-24: qty 50

## 2018-09-24 MED ORDER — PHENYLEPHRINE HCL-NACL 20-0.9 MG/250ML-% IV SOLN
0.0000 ug/min | INTRAVENOUS | Status: DC
  Administered 2018-09-24: 5 ug/min via INTRAVENOUS
  Administered 2018-09-25: 30 ug/min via INTRAVENOUS
  Filled 2018-09-24: qty 250

## 2018-09-24 MED ORDER — NITROGLYCERIN IN D5W 200-5 MCG/ML-% IV SOLN: 0.0000 ug/min | INTRAVENOUS | Status: DC

## 2018-09-24 MED ORDER — SODIUM CHLORIDE 0.9 % IV SOLN
INTRAVENOUS | Status: DC
  Administered 2018-09-25: 3.8 [IU]/h via INTRAVENOUS
  Filled 2018-09-24 (×3): qty 1

## 2018-09-24 MED ORDER — SODIUM CHLORIDE 0.9 % IV SOLN
INTRAVENOUS | Status: DC
  Administered 2018-09-25: 10:00:00 via INTRAVENOUS

## 2018-09-24 MED ORDER — BISACODYL 10 MG RE SUPP: 10.0000 mg | Freq: Every day | RECTAL | Status: DC

## 2018-09-24 MED ORDER — TRAMADOL HCL 50 MG PO TABS
50.0000 mg | ORAL_TABLET | ORAL | Status: DC | PRN
  Administered 2018-09-25 – 2018-09-30 (×10): 50 mg via ORAL
  Filled 2018-09-24 (×10): qty 1

## 2018-09-24 MED ORDER — PLASMA-LYTE 148 IV SOLN
INTRAVENOUS | Status: DC | PRN
  Administered 2018-09-24: 500 mL via INTRAVASCULAR

## 2018-09-24 MED ORDER — CHLORHEXIDINE GLUCONATE 0.12 % MT SOLN
OROMUCOSAL | Status: AC
  Filled 2018-09-24: qty 15

## 2018-09-24 MED ORDER — MORPHINE SULFATE (PF) 2 MG/ML IV SOLN
1.0000 mg | INTRAVENOUS | Status: AC | PRN
  Administered 2018-09-24: 2 mg via INTRAVENOUS

## 2018-09-24 MED ORDER — SODIUM CHLORIDE 0.9% FLUSH
10.0000 mL | Freq: Two times a day (BID) | INTRAVENOUS | Status: DC
  Administered 2018-09-24 – 2018-09-26 (×4): 10 mL

## 2018-09-24 MED ORDER — PROPOFOL 10 MG/ML IV BOLUS: INTRAVENOUS | Status: DC | PRN

## 2018-09-24 MED ORDER — ROCURONIUM BROMIDE 50 MG/5ML IV SOSY
PREFILLED_SYRINGE | INTRAVENOUS | Status: AC
  Filled 2018-09-24: qty 5

## 2018-09-24 MED ORDER — INSULIN REGULAR BOLUS VIA INFUSION
0.0000 [IU] | Freq: Three times a day (TID) | INTRAVENOUS | Status: DC
  Filled 2018-09-24: qty 10

## 2018-09-24 MED ORDER — METOPROLOL TARTRATE 25 MG/10 ML ORAL SUSPENSION: 12.5000 mg | Freq: Two times a day (BID) | ORAL | Status: DC

## 2018-09-24 MED ORDER — HEPARIN SODIUM (PORCINE) 1000 UNIT/ML IJ SOLN
INTRAMUSCULAR | Status: DC | PRN
  Administered 2018-09-24: 33000 [IU] via INTRAVENOUS
  Administered 2018-09-24: 3000 [IU] via INTRAVENOUS

## 2018-09-24 MED ORDER — HEPARIN SODIUM (PORCINE) 1000 UNIT/ML IJ SOLN
INTRAMUSCULAR | Status: AC
  Filled 2018-09-24: qty 1

## 2018-09-24 MED ORDER — ONDANSETRON HCL 4 MG/2ML IJ SOLN
4.0000 mg | Freq: Four times a day (QID) | INTRAMUSCULAR | Status: DC | PRN
  Administered 2018-09-24 – 2018-09-26 (×2): 4 mg via INTRAVENOUS
  Filled 2018-09-24 (×2): qty 2

## 2018-09-24 MED ORDER — LACTATED RINGERS IV SOLN: INTRAVENOUS | Status: DC

## 2018-09-24 MED ORDER — PANTOPRAZOLE SODIUM 40 MG PO TBEC
40.0000 mg | DELAYED_RELEASE_TABLET | Freq: Every day | ORAL | Status: DC
  Administered 2018-09-26 – 2018-09-30 (×5): 40 mg via ORAL
  Filled 2018-09-24 (×5): qty 1

## 2018-09-24 MED ORDER — DOCUSATE SODIUM 100 MG PO CAPS
200.0000 mg | ORAL_CAPSULE | Freq: Every day | ORAL | Status: DC
  Administered 2018-09-25 – 2018-09-28 (×4): 200 mg via ORAL
  Filled 2018-09-24 (×5): qty 2

## 2018-09-24 MED ORDER — ACETAMINOPHEN 160 MG/5ML PO SOLN: 1000.0000 mg | Freq: Four times a day (QID) | ORAL | Status: AC

## 2018-09-24 MED ORDER — SODIUM CHLORIDE 0.9 % IJ SOLN
OROMUCOSAL | Status: DC | PRN
  Administered 2018-09-24 (×3): 4 mL via TOPICAL

## 2018-09-24 MED ORDER — PROPOFOL 10 MG/ML IV BOLUS
INTRAVENOUS | Status: DC | PRN
  Administered 2018-09-24: 40 mg via INTRAVENOUS

## 2018-09-24 MED ORDER — OXYCODONE HCL 5 MG PO TABS
5.0000 mg | ORAL_TABLET | ORAL | Status: DC | PRN
  Administered 2018-09-25 (×3): 5 mg via ORAL
  Filled 2018-09-24 (×3): qty 1

## 2018-09-24 MED ORDER — SUFENTANIL CITRATE 50 MCG/ML IV SOLN
INTRAVENOUS | Status: DC | PRN
  Administered 2018-09-24: 40 ug via INTRAVENOUS
  Administered 2018-09-24 (×3): 10 ug via INTRAVENOUS
  Administered 2018-09-24 (×2): 20 ug via INTRAVENOUS

## 2018-09-24 MED ORDER — MIDAZOLAM HCL 5 MG/5ML IJ SOLN
INTRAMUSCULAR | Status: DC | PRN
  Administered 2018-09-24: 2 mg via INTRAVENOUS
  Administered 2018-09-24: 1 mg via INTRAVENOUS
  Administered 2018-09-24: 2 mg via INTRAVENOUS
  Administered 2018-09-24: 5 mg via INTRAVENOUS

## 2018-09-24 MED ORDER — VECURONIUM BROMIDE 10 MG IV SOLR
INTRAVENOUS | Status: DC | PRN
  Administered 2018-09-24 (×5): 2 mg via INTRAVENOUS
  Administered 2018-09-24: 10 mg via INTRAVENOUS

## 2018-09-24 MED ORDER — POTASSIUM CHLORIDE 10 MEQ/50ML IV SOLN
10.0000 meq | INTRAVENOUS | Status: AC
  Administered 2018-09-24 (×3): 10 meq via INTRAVENOUS

## 2018-09-24 MED ORDER — ASPIRIN EC 325 MG PO TBEC
325.0000 mg | DELAYED_RELEASE_TABLET | Freq: Every day | ORAL | Status: DC
  Administered 2018-09-25 – 2018-09-30 (×6): 325 mg via ORAL
  Filled 2018-09-24 (×6): qty 1

## 2018-09-24 MED ORDER — ACETAMINOPHEN 500 MG PO TABS
1000.0000 mg | ORAL_TABLET | Freq: Four times a day (QID) | ORAL | Status: AC
  Administered 2018-09-24 – 2018-09-29 (×16): 1000 mg via ORAL
  Filled 2018-09-24 (×16): qty 2

## 2018-09-24 MED ORDER — FAMOTIDINE IN NACL 20-0.9 MG/50ML-% IV SOLN
20.0000 mg | Freq: Two times a day (BID) | INTRAVENOUS | Status: AC
  Administered 2018-09-24: 20 mg via INTRAVENOUS

## 2018-09-24 MED ORDER — SODIUM CHLORIDE 0.9 % IV SOLN: 250.0000 mL | INTRAVENOUS | Status: DC

## 2018-09-24 MED ORDER — POTASSIUM CHLORIDE 10 MEQ/50ML IV SOLN
10.0000 meq | INTRAVENOUS | Status: AC
  Administered 2018-09-24 – 2018-09-25 (×3): 10 meq via INTRAVENOUS
  Filled 2018-09-24 (×3): qty 50

## 2018-09-24 MED ORDER — SODIUM CHLORIDE 0.9% FLUSH
3.0000 mL | Freq: Two times a day (BID) | INTRAVENOUS | Status: DC
  Administered 2018-09-25 – 2018-09-27 (×5): 3 mL via INTRAVENOUS

## 2018-09-24 MED ORDER — PROPOFOL 10 MG/ML IV BOLUS
INTRAVENOUS | Status: AC
  Filled 2018-09-24: qty 20

## 2018-09-24 MED ORDER — ASPIRIN 81 MG PO CHEW: 324.0000 mg | CHEWABLE_TABLET | Freq: Every day | ORAL | Status: DC

## 2018-09-24 MED ORDER — PHENYLEPHRINE 40 MCG/ML (10ML) SYRINGE FOR IV PUSH (FOR BLOOD PRESSURE SUPPORT)
PREFILLED_SYRINGE | INTRAVENOUS | Status: AC
  Filled 2018-09-24: qty 10

## 2018-09-24 MED ORDER — MORPHINE SULFATE (PF) 2 MG/ML IV SOLN
2.0000 mg | INTRAVENOUS | Status: DC | PRN
  Administered 2018-09-24: 2 mg via INTRAVENOUS
  Administered 2018-09-24: 4 mg via INTRAVENOUS
  Administered 2018-09-25 (×2): 2 mg via INTRAVENOUS
  Filled 2018-09-24: qty 1
  Filled 2018-09-24: qty 3
  Filled 2018-09-24: qty 1
  Filled 2018-09-24: qty 2
  Filled 2018-09-24: qty 1

## 2018-09-24 MED ORDER — CHLORHEXIDINE GLUCONATE 0.12 % MT SOLN
15.0000 mL | OROMUCOSAL | Status: AC
  Administered 2018-09-24: 15 mL via OROMUCOSAL

## 2018-09-24 MED ORDER — SODIUM CHLORIDE 0.45 % IV SOLN
INTRAVENOUS | Status: DC | PRN
  Administered 2018-09-24: 14:00:00 via INTRAVENOUS

## 2018-09-24 MED ORDER — METOPROLOL TARTRATE 12.5 MG HALF TABLET
ORAL_TABLET | ORAL | Status: AC
  Filled 2018-09-24: qty 1

## 2018-09-24 MED ORDER — METOPROLOL TARTRATE 12.5 MG HALF TABLET: 12.5000 mg | ORAL_TABLET | Freq: Once | ORAL | Status: DC

## 2018-09-24 MED ORDER — MIDAZOLAM HCL 2 MG/2ML IJ SOLN
2.0000 mg | INTRAMUSCULAR | Status: DC | PRN
  Administered 2018-09-24: 2 mg via INTRAVENOUS
  Filled 2018-09-24: qty 2

## 2018-09-24 MED ORDER — SODIUM CHLORIDE 0.9% FLUSH: 3.0000 mL | INTRAVENOUS | Status: DC | PRN

## 2018-09-24 MED ORDER — ACETAMINOPHEN 160 MG/5ML PO SOLN: 650.0000 mg | Freq: Once | ORAL | Status: AC

## 2018-09-24 MED ORDER — MIDAZOLAM HCL 10 MG/2ML IJ SOLN
INTRAMUSCULAR | Status: AC
  Filled 2018-09-24: qty 2

## 2018-09-24 MED ORDER — MAGNESIUM SULFATE 4 GM/100ML IV SOLN
4.0000 g | Freq: Once | INTRAVENOUS | Status: AC
  Administered 2018-09-24: 4 g via INTRAVENOUS
  Filled 2018-09-24: qty 100

## 2018-09-24 MED ORDER — METOPROLOL TARTRATE 12.5 MG HALF TABLET
12.5000 mg | ORAL_TABLET | Freq: Two times a day (BID) | ORAL | Status: DC
  Administered 2018-09-25: 12.5 mg via ORAL
  Filled 2018-09-24: qty 1

## 2018-09-24 MED ORDER — VANCOMYCIN HCL IN DEXTROSE 1-5 GM/200ML-% IV SOLN
1000.0000 mg | Freq: Once | INTRAVENOUS | Status: AC
  Administered 2018-09-24: 1000 mg via INTRAVENOUS
  Filled 2018-09-24: qty 200

## 2018-09-24 MED FILL — Heparin Sodium (Porcine) Inj 1000 Unit/ML: INTRAMUSCULAR | Qty: 10 | Status: AC

## 2018-09-24 MED FILL — Mannitol IV Soln 20%: INTRAVENOUS | Qty: 500 | Status: AC

## 2018-09-24 MED FILL — Sodium Bicarbonate IV Soln 8.4%: INTRAVENOUS | Qty: 50 | Status: AC

## 2018-09-24 MED FILL — Electrolyte-R (PH 7.4) Solution: INTRAVENOUS | Qty: 4000 | Status: AC

## 2018-09-24 MED FILL — Albumin, Human Inj 5%: INTRAVENOUS | Qty: 250 | Status: AC

## 2018-09-24 MED FILL — Lidocaine HCl(Cardiac) IV PF Soln Pref Syr 100 MG/5ML (2%): INTRAVENOUS | Qty: 10 | Status: AC

## 2018-09-24 MED FILL — Sodium Chloride IV Soln 0.9%: INTRAVENOUS | Qty: 2000 | Status: AC

## 2018-09-24 SURGICAL SUPPLY — 110 items
ADAPTER CARDIO PERF ANTE/RETRO (ADAPTER) ×4 IMPLANT
APPLICATOR COTTON TIP 6IN STRL (MISCELLANEOUS) IMPLANT
BAG DECANTER FOR FLEXI CONT (MISCELLANEOUS) ×4 IMPLANT
BANDAGE ACE 4X5 VEL STRL LF (GAUZE/BANDAGES/DRESSINGS) ×4 IMPLANT
BANDAGE ACE 6X5 VEL STRL LF (GAUZE/BANDAGES/DRESSINGS) ×4 IMPLANT
BASKET HEART  (ORDER IN 25'S) (MISCELLANEOUS) ×1
BASKET HEART (ORDER IN 25'S) (MISCELLANEOUS) ×1
BASKET HEART (ORDER IN 25S) (MISCELLANEOUS) ×2 IMPLANT
BLADE STERNUM SYSTEM 6 (BLADE) ×4 IMPLANT
BLADE SURG 15 STRL LF DISP TIS (BLADE) ×2 IMPLANT
BLADE SURG 15 STRL SS (BLADE) ×2
BNDG GAUZE ELAST 4 BULKY (GAUZE/BANDAGES/DRESSINGS) ×4 IMPLANT
CANISTER SUCT 3000ML PPV (MISCELLANEOUS) ×4 IMPLANT
CANNULA EZ GLIDE AORTIC 21FR (CANNULA) ×4 IMPLANT
CANNULA GUNDRY RCSP 15FR (MISCELLANEOUS) ×4 IMPLANT
CATH CPB KIT HENDRICKSON (MISCELLANEOUS) ×4 IMPLANT
CATH HEART VENT LEFT (CATHETERS) ×2 IMPLANT
CATH ROBINSON RED A/P 18FR (CATHETERS) ×8 IMPLANT
CATH THORACIC 36FR (CATHETERS) ×4 IMPLANT
CATH THORACIC 36FR RT ANG (CATHETERS) ×8 IMPLANT
CLIP FOGARTY SPRING 6M (CLIP) IMPLANT
CLIP VESOCCLUDE MED 24/CT (CLIP) IMPLANT
CLIP VESOCCLUDE SM WIDE 24/CT (CLIP) ×8 IMPLANT
CONT SPEC 4OZ CLIKSEAL STRL BL (MISCELLANEOUS) ×4 IMPLANT
CRADLE DONUT ADULT HEAD (MISCELLANEOUS) ×4 IMPLANT
DERMABOND ADVANCED (GAUZE/BANDAGES/DRESSINGS) ×2
DERMABOND ADVANCED .7 DNX12 (GAUZE/BANDAGES/DRESSINGS) ×2 IMPLANT
DRAPE CARDIOVASCULAR INCISE (DRAPES) ×2
DRAPE SLUSH/WARMER DISC (DRAPES) ×4 IMPLANT
DRAPE SRG 135X102X78XABS (DRAPES) ×2 IMPLANT
DRSG AQUACEL AG ADV 3.5X14 (GAUZE/BANDAGES/DRESSINGS) ×4 IMPLANT
DRSG COVADERM 4X14 (GAUZE/BANDAGES/DRESSINGS) ×4 IMPLANT
ELECT REM PT RETURN 9FT ADLT (ELECTROSURGICAL) ×8
ELECTRODE REM PT RTRN 9FT ADLT (ELECTROSURGICAL) ×4 IMPLANT
FELT TEFLON 1X6 (MISCELLANEOUS) ×12 IMPLANT
GAUZE SPONGE 4X4 12PLY STRL (GAUZE/BANDAGES/DRESSINGS) ×8 IMPLANT
GAUZE SPONGE 4X4 12PLY STRL LF (GAUZE/BANDAGES/DRESSINGS) ×8 IMPLANT
GLOVE SURG SIGNA 7.5 PF LTX (GLOVE) ×12 IMPLANT
GOWN STRL REUS W/ TWL LRG LVL3 (GOWN DISPOSABLE) ×8 IMPLANT
GOWN STRL REUS W/ TWL XL LVL3 (GOWN DISPOSABLE) ×4 IMPLANT
GOWN STRL REUS W/TWL LRG LVL3 (GOWN DISPOSABLE) ×8
GOWN STRL REUS W/TWL XL LVL3 (GOWN DISPOSABLE) ×4
HEMOSTAT POWDER SURGIFOAM 1G (HEMOSTASIS) ×12 IMPLANT
HEMOSTAT SURGICEL 2X14 (HEMOSTASIS) ×4 IMPLANT
INSERT FOGARTY XLG (MISCELLANEOUS) IMPLANT
KIT BASIN OR (CUSTOM PROCEDURE TRAY) ×4 IMPLANT
KIT SUCTION CATH 14FR (SUCTIONS) ×8 IMPLANT
KIT TURNOVER KIT B (KITS) ×4 IMPLANT
KIT VASOVIEW HEMOPRO VH 3000 (KITS) ×4 IMPLANT
LINE VENT (MISCELLANEOUS) ×4 IMPLANT
MARKER GRAFT CORONARY BYPASS (MISCELLANEOUS) ×12 IMPLANT
NS IRRIG 1000ML POUR BTL (IV SOLUTION) ×20 IMPLANT
PACK E OPEN HEART (SUTURE) ×4 IMPLANT
PACK OPEN HEART (CUSTOM PROCEDURE TRAY) ×4 IMPLANT
PAD ARMBOARD 7.5X6 YLW CONV (MISCELLANEOUS) ×8 IMPLANT
PAD ELECT DEFIB RADIOL ZOLL (MISCELLANEOUS) ×4 IMPLANT
PENCIL BUTTON HOLSTER BLD 10FT (ELECTRODE) ×4 IMPLANT
PUNCH AORTIC ROTATE 4.0MM (MISCELLANEOUS) IMPLANT
PUNCH AORTIC ROTATE 4.5MM 8IN (MISCELLANEOUS) ×4 IMPLANT
PUNCH AORTIC ROTATE 5MM 8IN (MISCELLANEOUS) IMPLANT
SET CARDIOPLEGIA MPS 5001102 (MISCELLANEOUS) ×4 IMPLANT
SOLUTION ANTI FOG 6CC (MISCELLANEOUS) ×4 IMPLANT
SPONGE LAP 18X18 X RAY DECT (DISPOSABLE) ×4 IMPLANT
SPONGE LAP 4X18 RFD (DISPOSABLE) ×4 IMPLANT
SUT BONE WAX W31G (SUTURE) ×4 IMPLANT
SUT ETHIBON 2 0 V 52N 30 (SUTURE) ×8 IMPLANT
SUT ETHIBON EXCEL 2-0 V-5 (SUTURE) IMPLANT
SUT ETHIBOND 2 0 SH (SUTURE) ×4
SUT ETHIBOND 2 0 SH 36X2 (SUTURE) ×4 IMPLANT
SUT ETHIBOND 2 0 V4 (SUTURE) IMPLANT
SUT ETHIBOND 2 0V4 GREEN (SUTURE) IMPLANT
SUT ETHIBOND 4 0 RB 1 (SUTURE) IMPLANT
SUT ETHIBOND NAB MH 2-0 36IN (SUTURE) ×4 IMPLANT
SUT ETHIBOND V-5 VALVE (SUTURE) IMPLANT
SUT MNCRL AB 4-0 PS2 18 (SUTURE) ×4 IMPLANT
SUT PROLENE 3 0 SH 1 (SUTURE) IMPLANT
SUT PROLENE 3 0 SH DA (SUTURE) ×4 IMPLANT
SUT PROLENE 4 0 RB 1 (SUTURE) ×10
SUT PROLENE 4 0 SH DA (SUTURE) IMPLANT
SUT PROLENE 4-0 RB1 .5 CRCL 36 (SUTURE) ×10 IMPLANT
SUT PROLENE 6 0 C 1 30 (SUTURE) ×8 IMPLANT
SUT PROLENE 7 0 BV1 MDA (SUTURE) ×8 IMPLANT
SUT PROLENE 8 0 BV175 6 (SUTURE) IMPLANT
SUT SILK  1 MH (SUTURE) ×2
SUT SILK 1 MH (SUTURE) ×2 IMPLANT
SUT STEEL 6MS V (SUTURE) ×4 IMPLANT
SUT STEEL STERNAL CCS#1 18IN (SUTURE) ×4 IMPLANT
SUT STEEL SZ 6 DBL 3X14 BALL (SUTURE) ×4 IMPLANT
SUT VIC AB 1 CTX 36 (SUTURE) ×4
SUT VIC AB 1 CTX36XBRD ANBCTR (SUTURE) ×4 IMPLANT
SUT VIC AB 2-0 CT1 27 (SUTURE) ×2
SUT VIC AB 2-0 CT1 TAPERPNT 27 (SUTURE) ×2 IMPLANT
SUT VIC AB 2-0 CTX 27 (SUTURE) IMPLANT
SUT VIC AB 3-0 SH 27 (SUTURE)
SUT VIC AB 3-0 SH 27X BRD (SUTURE) IMPLANT
SUT VIC AB 3-0 X1 27 (SUTURE) IMPLANT
SUT VICRYL 4-0 PS2 18IN ABS (SUTURE) IMPLANT
SYR 10ML KIT SKIN ADHESIVE (MISCELLANEOUS) IMPLANT
SYSTEM SAHARA CHEST DRAIN ATS (WOUND CARE) ×4 IMPLANT
TAPE CLOTH SURG 4X10 WHT LF (GAUZE/BANDAGES/DRESSINGS) ×8 IMPLANT
TAPE PAPER 2X10 WHT MICROPORE (GAUZE/BANDAGES/DRESSINGS) ×4 IMPLANT
TOWEL GREEN STERILE (TOWEL DISPOSABLE) ×4 IMPLANT
TOWEL GREEN STERILE FF (TOWEL DISPOSABLE) ×4 IMPLANT
TRAY FOLEY SLVR 16FR TEMP STAT (SET/KITS/TRAYS/PACK) ×4 IMPLANT
TUBE FEEDING 8FR 16IN STR KANG (MISCELLANEOUS) ×4 IMPLANT
TUBING INSUFFLATION (TUBING) ×4 IMPLANT
UNDERPAD 30X30 (UNDERPADS AND DIAPERS) ×4 IMPLANT
VALVE AORTIC SZ23 INSP/RESIL (Prosthesis & Implant Heart) ×4 IMPLANT
VENT LEFT HEART 12002 (CATHETERS) ×4
WATER STERILE IRR 1000ML POUR (IV SOLUTION) ×8 IMPLANT

## 2018-09-24 NOTE — Anesthesia Procedure Notes (Signed)
Procedure Name: Intubation Date/Time: 09/24/2018 7:49 AM Performed by: Moshe Salisbury, CRNA Pre-anesthesia Checklist: Patient identified, Emergency Drugs available, Suction available and Patient being monitored Patient Re-evaluated:Patient Re-evaluated prior to induction Oxygen Delivery Method: Circle System Utilized Preoxygenation: Pre-oxygenation with 100% oxygen Induction Type: IV induction Ventilation: Mask ventilation without difficulty Laryngoscope Size: Mac and 3 Grade View: Grade II Tube type: Oral Tube size: 8.0 mm Number of attempts: 1 Airway Equipment and Method: Stylet Placement Confirmation: ETT inserted through vocal cords under direct vision,  positive ETCO2 and breath sounds checked- equal and bilateral Secured at: 21 cm Tube secured with: Tape Dental Injury: Teeth and Oropharynx as per pre-operative assessment

## 2018-09-24 NOTE — Progress Notes (Signed)
Initiated Open Heart Rapid Wean per Protocol 

## 2018-09-24 NOTE — Progress Notes (Signed)
CT surgery p.m. Rounds  Patient doing well following CABG Extubated, stable hemodynamics Minimal chest tube output Postop labs satisfactory

## 2018-09-24 NOTE — Interval H&P Note (Signed)
History and Physical Interval Note:  CT chest IMPRESSION: 1. No acute cardiopulmonary abnormalities identified. 2. Aortic atherosclerosis and calcification of the aortic valve. The ascending thoracic aorta is upper limits of normal in caliber measuring 3.9 cm. Aortic Atherosclerosis (ICD10-I70.0). 3. Three vessel coronary artery atherosclerotic calcifications noted. 4. Ground-glass nodule within the left apex measures 6 mm. Initial follow-up with CT at 6-12 months is recommended to confirm persistence. If persistent, repeat CT is recommended every 2 years until 5 years of stability has been established. This recommendation follows the consensus statement: Guidelines for Management of Incidental Pulmonary Nodules Detected on CT Images: From the Fleischner Society 2017; Radiology 2017; 284:228-243. 5. Tiny solid nodule within the right upper lobe measures 3 mm. No follow-up needed if patient is low-risk. Non-contrast chest CT can be considered in 12 months if patient is high-risk. This recommendation follows the consensus statement: Guidelines for Management of Incidental Pulmonary Nodules Detected on CT Images: From the Fleischner Society 2017; Radiology 2017; 284:228-243.   Electronically Signed   By: Kerby Moors M.D.   On: 09/21/2018 13:43  I have reviewed the films. I believe we will be able to proceed with AVR CABG but may need to do [place] depending on intraoperative findings   09/24/2018 7:15 AM  [name] [name]  has presented today for surgery, with the diagnosis of AS CAD  The various methods of treatment have been discussed with the patient and family. After consideration of risks, benefits and other options for treatment, the patient has consented to  Procedure(s): AORTIC VALVE REPLACEMENT (AVR) (N/A) CORONARY ARTERY BYPASS GRAFTING (CABG) (N/A) TRANSESOPHAGEAL ECHOCARDIOGRAM (TEE) (N/A) as a surgical intervention .  The patient's history has been reviewed,  patient examined, no change in status, stable for surgery.  I have reviewed the patient's chart and labs.  Questions were answered to the patient's satisfaction.     Melrose Nakayama

## 2018-09-24 NOTE — Anesthesia Procedure Notes (Signed)
Arterial Line Insertion Start/End9/30/2019 7:10 AM Performed by: Moshe Salisbury, CRNA, CRNA  Patient location: Pre-op. Preanesthetic checklist: patient identified, IV checked, site marked, risks and benefits discussed, surgical consent, monitors and equipment checked, pre-op evaluation, timeout performed and anesthesia consent Lidocaine 1% used for infiltration and patient sedated Left, radial was placed Catheter size: 20 G Hand hygiene performed , maximum sterile barriers used  and Seldinger technique used Allen's test indicative of satisfactory collateral circulation Attempts: 1 Procedure performed without using ultrasound guided technique. Following insertion, dressing applied and Biopatch. Post procedure assessment: normal  Patient tolerated the procedure well with no immediate complications.

## 2018-09-24 NOTE — Transfer of Care (Signed)
Immediate Anesthesia Transfer of Care Note  Patient: [name] [name]  Procedure(s) Performed: AORTIC VALVE REPLACEMENT (AVR) using  an INSPIRIS RESILIA  Prosthetic Valve (Model #: 11500A, Size: 23MM, Serial #: [id number]) (N/A Chest) CORONARY ARTERY BYPASS GRAFTING (CABG) x 2; (LIMA to LAD, SVG to PDA) using RIGHT THIGH GREATER SAPHENOUS VEIN GRAFT (SVG) and LEFT INTERNAL MAMMARY ARTERY (LIMA): LIMA to LAD, SVG to PDA  (N/A Chest) TRANSESOPHAGEAL ECHOCARDIOGRAM (TEE) (N/A )  Patient Location: ICU  Anesthesia Type:General  Level of Consciousness: pateint uncooperative and Patient remains intubated per anesthesia plan  Airway & Oxygen Therapy: Patient remains intubated per anesthesia plan and Patient placed on Ventilator (see vital sign flow sheet for setting)  Post-op Assessment: Report given to RN and Post -op Vital signs reviewed and stable  Post vital signs: Reviewed and stable  Last Vitals:  Vitals Value Taken Time  BP 155/65 09/24/2018  2:15 PM  Temp 35.6 C 09/24/2018  2:23 PM  Pulse 80 09/24/2018  2:23 PM  Resp 15 09/24/2018  2:23 PM  SpO2 94 % 09/24/2018  2:23 PM  Vitals shown include unvalidated device data.  Last Pain:  Vitals:   09/24/18 0606  TempSrc: Oral         Complications: No apparent anesthesia complications

## 2018-09-24 NOTE — Procedures (Signed)
Extubation Procedure Note  Patient Details:   Name: [name] [name] DOB: 03-27-1946 MRN: [id number]   Airway Documentation:    Vent end date: 09/24/18 Vent end time: 1746   Evaluation  O2 sats: stable throughout Complications: No apparent complications Patient did tolerate procedure well. Bilateral Breath Sounds: Clear   Yes   Pt extubated per Open Heart Rapid Wean Protocol. NIF -40, VC 0.90L, ABG within normal limits. Positive cuff leak noted prior to extubation. Pt able to speak and has a strong productive cough post extubation. Pt encouraged to use Yankauer to clear secretions.   Jesse Sans 09/24/2018, 5:47 PM

## 2018-09-24 NOTE — Anesthesia Postprocedure Evaluation (Addendum)
Anesthesia Post Note  Patient: [name] [name]  Procedure(s) Performed: AORTIC VALVE REPLACEMENT (AVR) using  an INSPIRIS RESILIA  Prosthetic Valve (Model #: 11500A, Size: 23MM, Serial #: [id number]) (N/A Chest) CORONARY ARTERY BYPASS GRAFTING (CABG) x 2; (LIMA to LAD, SVG to PDA) using RIGHT THIGH GREATER SAPHENOUS VEIN GRAFT (SVG) and LEFT INTERNAL MAMMARY ARTERY (LIMA): LIMA to LAD, SVG to PDA  (N/A Chest) TRANSESOPHAGEAL ECHOCARDIOGRAM (TEE) (N/A )     Patient location during evaluation: SICU Anesthesia Type: General Level of consciousness: sedated and patient remains intubated per anesthesia plan Pain management: pain level controlled Vital Signs Assessment: post-procedure vital signs reviewed and stable Respiratory status: patient remains intubated per anesthesia plan and patient on ventilator - see flowsheet for VS Cardiovascular status: stable Anesthetic complications: no    Last Vitals:  Vitals:   09/24/18 1522 09/24/18 1526  BP: (!) 140/57   Pulse: 91 89  Resp: 14 14  Temp: (!) 35.1 C (!) 35.2 C  SpO2: 97% 98%    Last Pain:  Vitals:   09/24/18 0606  TempSrc: Oral                 Nolon Nations

## 2018-09-24 NOTE — Anesthesia Procedure Notes (Signed)
Central Venous Catheter Insertion Performed by: Duane Boston, MD, anesthesiologist Start/End9/30/2019 6:54 AM, 09/24/2018 7:04 AM Patient location: Pre-op. Preanesthetic checklist: patient identified, IV checked, site marked, risks and benefits discussed, surgical consent, monitors and equipment checked, pre-op evaluation, timeout performed and anesthesia consent Position: Trendelenburg Lidocaine 1% used for infiltration and patient sedated Hand hygiene performed , maximum sterile barriers used  and Seldinger technique used Catheter size: 8.5 Fr Total catheter length 8. PA cath was placed.Sheath introducer Swan type:thermodilution PA Cath depth:50 Procedure performed using ultrasound guided technique. Ultrasound Notes:anatomy identified, needle tip was noted to be adjacent to the nerve/plexus identified, no ultrasound evidence of intravascular and/or intraneural injection and image(s) printed for medical record Attempts: 1 Following insertion, line sutured and dressing applied. Post procedure assessment: free fluid flow, blood return through all ports and no air  Patient tolerated the procedure well with no immediate complications.

## 2018-09-24 NOTE — Progress Notes (Signed)
  Echocardiogram 2D Echocardiogram has been performed.  Jennette Dubin 09/24/2018, 8:34 AM

## 2018-09-24 NOTE — Brief Op Note (Signed)
09/24/2018  5:40 PM  PATIENT:  [name] [name]  72 y.o. female  PRE-OPERATIVE DIAGNOSIS:  1. SEVERE AORTIC STENOSIS 2. CAD  POST-OPERATIVE DIAGNOSIS: 1. SEVERE AORTIC STENOSIS 2. CAD   PROCEDURE:  TRANSESOPHAGEAL ECHOCARDIOGRAM (TEE), MEDIAN STERNOTOMY for CORONARY ARTERY BYPASS GRAFTING (CABG) x 2  LIMA to LAD,   SVG to PDA Endoscopic vein harvest RIGHT THIGH GREATER SAPHENOUS VEIN AORTIC VALVE REPLACEMENT (AVR) (using an Inspiris prosthetic valve, Model # 11500A, Size 23, Serial # [id number])  SURGEON:  Surgeon(s) and Role:    Melrose Nakayama, MD - Primary  PHYSICIAN ASSISTANT: Lars Pinks PA-C  ASSISTANT: Ara Kussmaul RNFA   ANESTHESIA:   general  EBL:  600 mL   DRAINS: Chest tubes placed in the mediastinal and pleural spaces   SPECIMEN:  Source of Specimen:  Native aortic valve leaflets  DISPOSITION OF SPECIMEN:  PATHOLOGY  COUNTS CORRECT:  YES  DICTATION: .Dragon Dictation  PLAN OF CARE: Admit to inpatient   PATIENT DISPOSITION:  ICU - intubated and hemodynamically stable.   Delay start of Pharmacological VTE agent (>24hrs) due to surgical blood loss or risk of bleeding: yes  BASELINE WEIGHT: 100.6 kg

## 2018-09-25 ENCOUNTER — Inpatient Hospital Stay (HOSPITAL_COMMUNITY): Payer: PPO

## 2018-09-25 ENCOUNTER — Encounter (HOSPITAL_COMMUNITY): Payer: Self-pay | Admitting: Thoracic Surgery (Cardiothoracic Vascular Surgery)

## 2018-09-25 LAB — BASIC METABOLIC PANEL
Anion gap: 3 — ABNORMAL LOW (ref 5–15)
BUN: 7 mg/dL — AB (ref 8–23)
CALCIUM: 7.7 mg/dL — AB (ref 8.9–10.3)
CO2: 25 mmol/L (ref 22–32)
CREATININE: 0.54 mg/dL (ref 0.44–1.00)
Chloride: 110 mmol/L (ref 98–111)
Glucose, Bld: 111 mg/dL — ABNORMAL HIGH (ref 70–99)
Potassium: 3.4 mmol/L — ABNORMAL LOW (ref 3.5–5.1)
SODIUM: 138 mmol/L (ref 135–145)

## 2018-09-25 LAB — GLUCOSE, CAPILLARY
GLUCOSE-CAPILLARY: 121 mg/dL — AB (ref 70–99)
GLUCOSE-CAPILLARY: 107 mg/dL — AB (ref 70–99)
GLUCOSE-CAPILLARY: 129 mg/dL — AB (ref 70–99)
GLUCOSE-CAPILLARY: 102 mg/dL — AB (ref 70–99)
GLUCOSE-CAPILLARY: 114 mg/dL — AB (ref 70–99)
GLUCOSE-CAPILLARY: 142 mg/dL — AB (ref 70–99)
GLUCOSE-CAPILLARY: 91 mg/dL (ref 70–99)
Glucose-Capillary: 105 mg/dL — ABNORMAL HIGH (ref 70–99)
Glucose-Capillary: 106 mg/dL — ABNORMAL HIGH (ref 70–99)
Glucose-Capillary: 107 mg/dL — ABNORMAL HIGH (ref 70–99)
Glucose-Capillary: 117 mg/dL — ABNORMAL HIGH (ref 70–99)
Glucose-Capillary: 120 mg/dL — ABNORMAL HIGH (ref 70–99)
Glucose-Capillary: 123 mg/dL — ABNORMAL HIGH (ref 70–99)
Glucose-Capillary: 130 mg/dL — ABNORMAL HIGH (ref 70–99)
Glucose-Capillary: 90 mg/dL (ref 70–99)
Glucose-Capillary: 92 mg/dL (ref 70–99)

## 2018-09-25 LAB — POCT I-STAT, CHEM 8
BUN: 7 mg/dL — AB (ref 8–23)
CALCIUM ION: 1.23 mmol/L (ref 1.15–1.40)
CHLORIDE: 103 mmol/L (ref 98–111)
Creatinine, Ser: 0.5 mg/dL (ref 0.44–1.00)
Glucose, Bld: 112 mg/dL — ABNORMAL HIGH (ref 70–99)
HEMATOCRIT: 22 % — AB (ref 36.0–46.0)
Hemoglobin: 7.5 g/dL — ABNORMAL LOW (ref 12.0–15.0)
Potassium: 4.1 mmol/L (ref 3.5–5.1)
SODIUM: 138 mmol/L (ref 135–145)
TCO2: 25 mmol/L (ref 22–32)

## 2018-09-25 LAB — CBC
HCT: 27.5 % — ABNORMAL LOW (ref 36.0–46.0)
HEMATOCRIT: 26.6 % — AB (ref 36.0–46.0)
HEMOGLOBIN: 8.7 g/dL — AB (ref 12.0–15.0)
Hemoglobin: 8.2 g/dL — ABNORMAL LOW (ref 12.0–15.0)
MCH: 27.9 pg (ref 26.0–34.0)
MCH: 28 pg (ref 26.0–34.0)
MCHC: 31.6 g/dL (ref 30.0–36.0)
MCHC: 30.8 g/dL (ref 30.0–36.0)
MCV: 88.1 fL (ref 78.0–100.0)
MCV: 90.8 fL (ref 78.0–100.0)
PLATELETS: 169 10*3/uL (ref 150–400)
PLATELETS: 151 10*3/uL (ref 150–400)
RBC: 3.12 MIL/uL — ABNORMAL LOW (ref 3.87–5.11)
RBC: 2.93 MIL/uL — AB (ref 3.87–5.11)
RDW: 14.2 % (ref 11.5–15.5)
RDW: 14.6 % (ref 11.5–15.5)
WBC: 12.9 10*3/uL — ABNORMAL HIGH (ref 4.0–10.5)
WBC: 12.7 10*3/uL — AB (ref 4.0–10.5)

## 2018-09-25 LAB — MAGNESIUM
MAGNESIUM: 2.5 mg/dL — AB (ref 1.7–2.4)
Magnesium: 2.4 mg/dL (ref 1.7–2.4)

## 2018-09-25 LAB — CREATININE, SERUM
Creatinine, Ser: 0.69 mg/dL (ref 0.44–1.00)
GFR calc Af Amer: >60 mL/min (ref >60)
GFR calc non Af Amer: >60 mL/min (ref >60)

## 2018-09-25 MED ORDER — ENOXAPARIN SODIUM 40 MG/0.4ML ~~LOC~~ SOLN
40.0000 mg | Freq: Every day | SUBCUTANEOUS | Status: DC
  Administered 2018-09-25 – 2018-09-29 (×5): 40 mg via SUBCUTANEOUS
  Filled 2018-09-25 (×5): qty 0.4

## 2018-09-25 MED ORDER — POTASSIUM CHLORIDE 10 MEQ/50ML IV SOLN
10.0000 meq | INTRAVENOUS | Status: AC
  Administered 2018-09-25 (×3): 10 meq via INTRAVENOUS
  Filled 2018-09-25 (×3): qty 50

## 2018-09-25 MED ORDER — DICYCLOMINE HCL 10 MG PO CAPS
10.0000 mg | ORAL_CAPSULE | Freq: Four times a day (QID) | ORAL | Status: DC | PRN
  Filled 2018-09-25: qty 1

## 2018-09-25 MED ORDER — MECLIZINE HCL 25 MG PO TABS: 25.0000 mg | ORAL_TABLET | Freq: Three times a day (TID) | ORAL | Status: DC | PRN

## 2018-09-25 MED ORDER — INSULIN ASPART 100 UNIT/ML ~~LOC~~ SOLN: 3.0000 [IU] | Freq: Three times a day (TID) | SUBCUTANEOUS | Status: DC

## 2018-09-25 MED ORDER — INSULIN DETEMIR 100 UNIT/ML ~~LOC~~ SOLN
30.0000 [IU] | Freq: Two times a day (BID) | SUBCUTANEOUS | Status: DC
  Administered 2018-09-25: 30 [IU] via SUBCUTANEOUS
  Filled 2018-09-25 (×3): qty 0.3

## 2018-09-25 MED ORDER — POTASSIUM CHLORIDE 10 MEQ/50ML IV SOLN
10.0000 meq | INTRAVENOUS | Status: AC
  Administered 2018-09-25 (×2): 10 meq via INTRAVENOUS
  Filled 2018-09-25 (×2): qty 50

## 2018-09-25 MED ORDER — ALBUMIN HUMAN 5 % IV SOLN
12.5000 g | Freq: Once | INTRAVENOUS | Status: AC
  Administered 2018-09-25: 12.5 g via INTRAVENOUS

## 2018-09-25 MED ORDER — PROPYLENE GLYCOL 0.6 % OP SOLN: 1.0000 [drp] | Freq: Two times a day (BID) | OPHTHALMIC | Status: DC | PRN

## 2018-09-25 MED ORDER — INSULIN ASPART 100 UNIT/ML ~~LOC~~ SOLN
0.0000 [IU] | SUBCUTANEOUS | Status: DC
  Administered 2018-09-25: 2 [IU] via SUBCUTANEOUS

## 2018-09-25 MED ORDER — POLYVINYL ALCOHOL 1.4 % OP SOLN
1.0000 [drp] | OPHTHALMIC | Status: DC | PRN
  Filled 2018-09-25: qty 15

## 2018-09-25 NOTE — Progress Notes (Signed)
1 Day Post-Op Procedure(s) (LRB): AORTIC VALVE REPLACEMENT (AVR) using  an INSPIRIS RESILIA  Prosthetic Valve (Model #: 11500A, Size: 23MM, Serial #: [id number]) (N/A) CORONARY ARTERY BYPASS GRAFTING (CABG) x 2; (LIMA to LAD, SVG to PDA) using RIGHT THIGH GREATER SAPHENOUS VEIN GRAFT (SVG) and LEFT INTERNAL MAMMARY ARTERY (LIMA): LIMA to LAD, SVG to PDA  (N/A) TRANSESOPHAGEAL ECHOCARDIOGRAM (TEE) (N/A) Subjective: Light headed when dangled Pain better this AM  Objective: Vital signs in last 24 hours: Temp:  [95.2 F (35.1 C)-99.3 F (37.4 C)] 98.8 F (37.1 C) (10/01 0700) Pulse Rate:  [82-91] 88 (10/01 0700) Cardiac Rhythm: Atrial paced (10/01 0400) Resp:  [12-18] 16 (10/01 0700) BP: (94-155)/(57-73) 122/62 (10/01 0700) SpO2:  [94 %-99 %] 95 % (10/01 0700) Arterial Line BP: (88-155)/(42-64) 128/44 (10/01 0700) FiO2 (%):  [50 %] 50 % (09/30 1522) Weight:  [104.9 kg] 104.9 kg (10/01 0500)  Hemodynamic parameters for last 24 hours: PAP: (21-33)/(6-16) 29/7 CO:  [3.6 L/min-5.8 L/min] 5.5 L/min CI:  [1.7 L/min/m2-2.8 L/min/m2] 2.7 L/min/m2  Intake/Output from previous day: 09/30 0701 - 10/01 0700 In: 4112.1 [I.V.:994.8; Blood:300; IV Piggyback:2817.3] Out: 5075 [Urine:4025; Blood:600; Chest Tube:450] Intake/Output this shift: No intake/output data recorded.  General appearance: alert, cooperative and no distress Neurologic: intact Heart: regular rate and rhythm and + systolic murmur Lungs: diminished breath sounds bibasilar Abdomen: normal findings: soft, non-tender  Lab Results: Recent Labs    09/24/18 1958 09/24/18 2001 09/25/18 0420  WBC 15.2*  --  12.9*  HGB 9.2* 8.5* 8.7*  HCT 28.7* 25.0* 27.5*  PLT 150  --  169   BMET:  Recent Labs    09/24/18 2001 09/25/18 0420  NA 141 138  K 3.6 3.4*  CL 105 110  CO2  --  25  GLUCOSE 140* 111*  BUN 9 7*  CREATININE 0.40* 0.54  CALCIUM  --  7.7*    PT/INR:  Recent Labs    09/24/18 1416  LABPROT 17.6*  INR 1.46    ABG    Component Value Date/Time   PHART 7.373 09/24/2018 1847   HCO3 22.7 09/24/2018 1847   TCO2 25 09/24/2018 2001   ACIDBASEDEF 2.0 09/24/2018 1847   O2SAT 97.0 09/24/2018 1847   CBG (last 3)  Recent Labs    09/25/18 0500 09/25/18 0602 09/25/18 0658  GLUCAP 106* 107* 129*    Assessment/Plan: S/P Procedure(s) (LRB): AORTIC VALVE REPLACEMENT (AVR) using  an INSPIRIS RESILIA  Prosthetic Valve (Model #: 11500A, Size: 23MM, Serial #: [id number]) (N/A) CORONARY ARTERY BYPASS GRAFTING (CABG) x 2; (LIMA to LAD, SVG to PDA) using RIGHT THIGH GREATER SAPHENOUS VEIN GRAFT (SVG) and LEFT INTERNAL MAMMARY ARTERY (LIMA): LIMA to LAD, SVG to PDA  (N/A) TRANSESOPHAGEAL ECHOCARDIOGRAM (TEE) (N/A) -NEURO- intact  CV- s/p AVR, CABG  Good hemodynamics- wean neo and dopamine  Orthostatic this AM- intravascularly dry- will give albumin this AM  RESP- IS  RENAL- creatinine OK  Hypokalemia- supplement  Anemia secondary to ABL- mild, follow  ENDO- CBG controlled with insulin drip- transition to levemir + SSI  Cardiac rehab   LOS: 1 day    [name] [name] 09/25/2018

## 2018-09-25 NOTE — Progress Notes (Signed)
Pt had vagal episode while standing on scale; pt went briefly unresponsive, assisted back into bed by 2 RNs. Pt recovered from episode and is A&Ox4. Will continue to closely monitor.  Sherlie Ban, RN

## 2018-09-25 NOTE — Op Note (Signed)
NAME: [name] [name], [name] [name] MEDICAL RECORD [id number] ACCOUNT [id number] DATE OF BIRTH:12/01/1946 FACILITY: [facility] LOCATION: [facility] PHYSICIAN:STEVEN Chaya Jan, MD  OPERATIVE REPORT  DATE OF PROCEDURE:  09/24/2018  PREOPERATIVE DIAGNOSIS:  Severe aortic stenosis with 2-vessel coronary artery disease.  POSTOPERATIVE DIAGNOSIS:  Severe aortic stenosis with 2-vessel coronary disease.  PROCEDURE: Median sternotomy, extracorporeal circulation.   Aortic valve replacement with 23 mm Edwards Inspiris Resilia pericardial valve (model number 11500A, serial number [id number]). Coronary artery bypass grafting x 2 (left internal mammary artery to left anterior descending, saphenous vein graft to posterior descending), Endoscopic vein harvest right thigh.  SURGEON:  Modesto Charon, MD  ASSISTANT:  Lars Pinks, PA-C  ANESTHESIA:  General.  FINDINGS:  Severe aortic stenosis, severe eggshell type calcification of the proximal aorta.  Distal aorta relatively normal.  PDA fair quality.  LAD a good quality conduits.  Post-bypass transesophageal echocardiography revealed good function of the  prosthetic valve with no paravalvular leaks.  CLINICAL NOTE:  [name] [name] is a 72 year old woman with a past medical history of obesity, hypertension, hyperlipidemia, remote TIA, arthritis and aortic stenosis.  She presented with worsening shortness of breath with exertion.  An echocardiogram  earlier in the year had shown progression of her aortic stenosis.  She underwent cardiac catheterization and was found to have severe 2-vessel disease with a totally occluded right coronary and a 95% stenosis of the LAD.  Findings were consistent with severe aortic stenosis.  She was referred for aortic valve replacement and coronary artery bypass grafting.  The indications, risks, benefits, and alternatives were discussed in detail with the patient.  She understood and  accepted the risks and agreed to  proceed.  DESCRIPTION OF PROCEDURE:  The patient was brought to the preoperative holding area on 09/24/2018.  She had a Swan-Ganz catheter and an arterial blood pressure monitoring line placed.  Intravenous antibiotics were administered.  She was taken to the  operating room, anesthetized and intubated.  Transesophageal echocardiography was performed by Dr. Lissa Hoard.  Please refer to his separately dictated note for full details, but she had severe aortic stenosis with trace regurgitation.  There was mild  mitral regurgitation.  There was preserved left ventricular function with left ventricular hypertrophy.  The chest, abdomen and legs were prepped and draped in the usual sterile fashion.  A median sternotomy was performed and the left internal mammary  artery was harvested using standard technique.  Simultaneously, incision was made in the medial aspect of the right leg at the level of the knee.  The greater saphenous vein was harvested from the right thigh.  Both the mammary and vein were good quality  vessels; 2000 units was administered during the vessel harvest.  The remainder of the full heparin dose was given prior to opening the pericardium.  After harvesting the conduits the pericardium was opened.  The ascending aorta was inspected.  There was a severe eggshell type calcification proximally, but distally the aorta was of normal caliber and there was no evidence of atherosclerotic disease.   After confirming adequate anticoagulation with ACT measurement, the aorta was cannulated via concentric 2-0 Ethibond pledgeted pursestring sutures.  A dual stage venous cannula was placed via pursestring suture in the right atrial appendage.   Cardiopulmonary bypass was initiated.  Flows were maintained per protocol.  The patient was cooled to 28 degrees Celsius.  Carbon dioxide was insufflated into the operative field.  The coronary arteries were inspected and anastomotic sites were chosen.   The conduits  were  inspected and cut to length.  A left ventricular vent was placed via pursestring suture in the right superior pulmonary vein and directed into the left ventricle.  A retrograde cardioplegia cannula was placed via pursestring suture in  the right atrium and directed into the coronary sinus.  A foam pad was placed in the pericardium.  A temperature probe was placed in the myocardial septum and a cardioplegia cannula was placed in the ascending aorta.  The aorta was cross clamped.  Cardiac arrest was achieved with a combination of cold antegrade and retrograde blood cardioplegia and topical iced saline.  An initial 500 mL of cardioplegia was administered antegrade.  There was rapid diastolic arrest.   An additional 500 mL of cardioplegia was administered retrograde.  There was septal cooling to 10 degrees Celsius.  Additional cardioplegia was administered at the completion of each graft as well as at 20 minute intervals during the valve replacement.  A reversed saphenous vein graft was placed end-to-side to the posterior descending branch of the right coronary.  This was a 1.5 mm vessel at the site of the anastomosis.  A probe did pass distally.  The vessel was of fair quality.  The vein was of  relatively large caliber, but otherwise good quality.  The anastomosis was performed with a running 7-0 Prolene suture.  A probe passed easily proximally and distally.  At the completion of the anastomosis, cardioplegia was administered down the graft.   There was good flow and good hemostasis.  The left internal mammary artery was brought through a window in the pericardium.  The distal limb was bevelled.  It was anastomosed end-to-side to the distal LAD.  The LAD was more diseased than it appeared on catheterization, but it was a 2 mm vessel at  the site of the anastomosis and 1.5 mm probe did pass both proximally and distally.  The mammary was a 2 mm good quality conduit.  The end-to-side anastomosis was  performed with a running 8-0 Prolene suture.  After completion of the mammary to LAD  anastomosis, the bulldog clamp was briefly removed.  There was good hemostasis and septal rewarming was noted.  The bulldog clamp was replaced.  The mammary pedicle was tacked to the epicardial surface of the heart with 6-0 Prolene sutures.  Additional  cardioplegia was administered retrograde.  A transverse aortotomy was performed just above the aortic fat pad in an area where there was no significant calcification.  This was approximately 5 mm more distal than the normal.  The aortic valve was inspected.  There was a trileaflet valve that was  stenotic and calcific and there was severe annular calcification.  The valve leaflets were excised.  The annular calcium was debrided, taking care to contain all calcific debris.  The annulus was copiously irrigated with iced saline and the annulus sized  for a 23 mm Edwards Inspiris Resilia pericardial valve.  2-0 Ethibond horizontal mattress sutures with subannular pledgets were placed circumferentially around the annulus.  Sixteen sutures were used in all.  The sutures were passed through the sewing  ring of the valve.  The valve was lowered into place and the sutures were sequentially tied.  The valve was well seated on the annulus.  There was no impingement or obstruction of the coronary ostia.  Rewarming was begun.  The aortotomy was closed in 2  layers with running 4-0 Prolene sutures.  Teflon felt strips were used to buttress the aorta on both sides of the  closure.  The first layer was a running horizontal mattress suture followed by a running simple suture.  The cardioplegia cannula was removed from the ascending aorta.  The vein graft was cut to length.  The proximal anastomosis for the vein graft was performed to a 4.5 mm punch aortotomy with a running 6-0 Prolene suture.  At the completion of the proximal  anastomosis,  before the suture was tied, the patient was  placed in Trendelenburg position.  Lidocaine was administered.  A warm dose of retrograde cardioplegia was administered.  Extensive deairing maneuvers were performed.  The aortic crossclamp was  removed.  The total crossclamp time was 143 minutes.  The patient required 2 defibrillations with 10 joules and then was in a bradycardic rhythm thereafter.  While rewarming was completed, all proximal and distal anastomoses and the aortotomy were inspected for hemostasis, the left ventricular vent was  removed.  Additional de-airing maneuvers were performed while doing so. The retrograde cardioplegia cannula was removed.  Epicardial pacing wires were placed on the right ventricle and right atrium and DDD pacing was initiated.  When the patient had  reached a core temperature 37 degrees Celsius, she was weaned from cardiopulmonary bypass on the first attempt.  Total bypass time was 182 minutes.  Post-bypass transesophageal echocardiography showed good function of the prosthetic valve with no paravalvular leaks.  The mean gradient across the valve was 8 mmHg.  There was preserved left ventricular function.  The initial cardiac index was greater  than 2 liters per minute per meter squared, and the patient remained hemodynamically stable throughout the post-bypass period.  A test dose of protamine was administered and was well tolerated.  The atrial and aortic cannulae were removed.  The remainder of the protamine was administered without incident.  The chest was irrigated with warm saline.  Hemostasis was achieved.  The  pericardium was not closed.  Left pleural and mediastinal chest tubes were placed through separate subcostal incisions.  The sternum was closed with a combination of single and double heavy gauge stainless steel wires.  The patient did have a drop in  cardiac output after sternal closure, there was no change in the EKG tracing and no significant hemodynamic change in terms of blood pressure or  heart rate.  The patient was started on low dose dopamine infusion and volume resuscitation continued.  The  pectoralis fascia, subcutaneous tissue and skin were closed in standard fashion.  All sponge, needle and instrument counts were correct at the end of the procedure.  The patient was taken from the operating room to the surgical Intensive Care Unit, intubated and in fair condition.     AN/NUANCE  D:09/24/2018 T:09/24/2018 JOB:[id number]

## 2018-09-25 NOTE — Progress Notes (Signed)
Patient ID: [name] [name], female   DOB: 03-31-1946, 72 y.o.   MRN: [id number] EVENING ROUNDS NOTE :     [address]       , [id number]             [phone]                 1 Day Post-Op Procedure(s) (LRB): AORTIC VALVE REPLACEMENT (AVR) using  an INSPIRIS RESILIA  Prosthetic Valve (Model #: 11500A, Size: 23MM, Serial #: [id number]) (N/A) CORONARY ARTERY BYPASS GRAFTING (CABG) x 2; (LIMA to LAD, SVG to PDA) using RIGHT THIGH GREATER SAPHENOUS VEIN GRAFT (SVG) and LEFT INTERNAL MAMMARY ARTERY (LIMA): LIMA to LAD, SVG to PDA  (N/A) TRANSESOPHAGEAL ECHOCARDIOGRAM (TEE) (N/A)  Total Length of Stay:  LOS: 1 day  BP 105/64   Pulse 90   Temp 98.6 F (37 C)   Resp (!) 23   Ht 5\' 5"  (1.651 m)   Wt 104.9 kg   SpO2 95%   BMI 38.48 kg/m   .Intake/Output      09/30 0701 - 10/01 0700 10/01 0701 - 10/02 0700   I.V. (mL/kg) 994.8 (9.5) 306 (2.9)   Blood 300    IV Piggyback 2817.3 497.9   Total Intake(mL/kg) 4112.1 (39.2) 804 (7.7)   Urine (mL/kg/hr) 4025 (1.6) 375 (0.3)   Emesis/NG output 0    Blood 600    Chest Tube 450 0   Total Output 5075 375   Net -962.9 +429          . sodium chloride Stopped (09/25/18 0737)  . sodium chloride    . sodium chloride Stopped (09/25/18 1020)  . dexmedetomidine (PRECEDEX) IV infusion Stopped (09/25/18 0458)  . DOPamine Stopped (09/25/18 1214)  . lactated ringers    . lactated ringers 20 mL/hr at 09/25/18 1700  . lactated ringers    . nitroGLYCERIN    . phenylephrine (NEO-SYNEPHRINE) Adult infusion Stopped (09/25/18 1109)     Lab Results  Component Value Date   WBC 12.7 (H) 09/25/2018   HGB 7.5 (L) 09/25/2018   HCT 22.0 (L) 09/25/2018   PLT 151 09/25/2018   GLUCOSE 112 (H) 09/25/2018   CHOL 229 (H) 09/05/2018   TRIG 284 (H) 09/05/2018   HDL 41 09/05/2018   LDLCALC 131 (H) 09/05/2018   ALT 23 09/21/2018   AST 24 09/21/2018   NA 138 09/25/2018   K 4.1 09/25/2018   CL 103 09/25/2018   CREATININE 0.50  09/25/2018   BUN 7 (L) 09/25/2018   CO2 25 09/25/2018   TSH 0.474 09/05/2018   INR 1.46 09/24/2018   HGBA1C 5.8 (H) 09/21/2018   Passed out when stood this am Later today sat in chair ok  Cbc at 16:48 26.6/8.2 in lab Still paced 90[name] MD  Beeper [phone] Office [phone] 09/25/2018 6:12 PM

## 2018-09-26 ENCOUNTER — Inpatient Hospital Stay (HOSPITAL_COMMUNITY): Payer: PPO

## 2018-09-26 LAB — CBC
HCT: 24.1 % — ABNORMAL LOW (ref 36.0–46.0)
Hemoglobin: 7.8 g/dL — ABNORMAL LOW (ref 12.0–15.0)
MCH: 28.2 pg (ref 26.0–34.0)
MCHC: 32.4 g/dL (ref 30.0–36.0)
MCV: 87 fL (ref 78.0–100.0)
PLATELETS: 124 10*3/uL — AB (ref 150–400)
RBC: 2.77 MIL/uL — AB (ref 3.87–5.11)
RDW: 14.7 % (ref 11.5–15.5)
WBC: 11.5 10*3/uL — ABNORMAL HIGH (ref 4.0–10.5)

## 2018-09-26 LAB — BASIC METABOLIC PANEL
Anion gap: 9 (ref 5–15)
BUN: 7 mg/dL — ABNORMAL LOW (ref 8–23)
CALCIUM: 8.6 mg/dL — AB (ref 8.9–10.3)
CHLORIDE: 104 mmol/L (ref 98–111)
CO2: 23 mmol/L (ref 22–32)
CREATININE: 0.57 mg/dL (ref 0.44–1.00)
GFR calc Af Amer: >60 mL/min (ref >60)
GFR calc non Af Amer: >60 mL/min (ref >60)
GLUCOSE: 113 mg/dL — AB (ref 70–99)
Potassium: 4.2 mmol/L (ref 3.5–5.1)
Sodium: 136 mmol/L (ref 135–145)

## 2018-09-26 LAB — GLUCOSE, CAPILLARY
GLUCOSE-CAPILLARY: 105 mg/dL — AB (ref 70–99)
Glucose-Capillary: 120 mg/dL — ABNORMAL HIGH (ref 70–99)
Glucose-Capillary: 97 mg/dL (ref 70–99)
Glucose-Capillary: 105 mg/dL — ABNORMAL HIGH (ref 70–99)
Glucose-Capillary: 95 mg/dL (ref 70–99)
Glucose-Capillary: 92 mg/dL (ref 70–99)

## 2018-09-26 MED ORDER — METOPROLOL TARTRATE 25 MG/10 ML ORAL SUSPENSION: 25.0000 mg | Freq: Two times a day (BID) | ORAL | Status: DC

## 2018-09-26 MED ORDER — INSULIN ASPART 100 UNIT/ML ~~LOC~~ SOLN: 0.0000 [IU] | Freq: Three times a day (TID) | SUBCUTANEOUS | Status: DC

## 2018-09-26 MED ORDER — INSULIN ASPART 100 UNIT/ML ~~LOC~~ SOLN: 0.0000 [IU] | Freq: Every day | SUBCUTANEOUS | Status: DC

## 2018-09-26 MED ORDER — METOPROLOL TARTRATE 25 MG PO TABS
25.0000 mg | ORAL_TABLET | Freq: Two times a day (BID) | ORAL | Status: DC
  Administered 2018-09-26 – 2018-09-30 (×9): 25 mg via ORAL
  Filled 2018-09-26 (×9): qty 1

## 2018-09-26 MED ORDER — AMIODARONE HCL 200 MG PO TABS
400.0000 mg | ORAL_TABLET | Freq: Two times a day (BID) | ORAL | Status: DC
  Administered 2018-09-26 – 2018-09-30 (×9): 400 mg via ORAL
  Filled 2018-09-26 (×9): qty 2

## 2018-09-26 NOTE — Plan of Care (Signed)
  Problem: Clinical Measurements: Goal: Ability to maintain clinical measurements within normal limits will improve Outcome: Progressing Goal: Diagnostic test results will improve Outcome: Progressing   Problem: Activity: Goal: Risk for activity intolerance will decrease Outcome: Progressing   Problem: Nutrition: Goal: Adequate nutrition will be maintained Outcome: Progressing   Problem: Pain Managment: Goal: General experience of comfort will improve Outcome: Progressing   Problem: Safety: Goal: Ability to remain free from injury will improve Outcome: Progressing   

## 2018-09-26 NOTE — Progress Notes (Signed)
[facility] BRIEF SICU PROGRESS NOTE  2 Days Post-Op  S/P Procedure(s) (LRB): AORTIC VALVE REPLACEMENT (AVR) using  an INSPIRIS RESILIA  Prosthetic Valve (Model #: 11500A, Size: 23MM, Serial #: [id number]) (N/A) CORONARY ARTERY BYPASS GRAFTING (CABG) x 2; (LIMA to LAD, SVG to PDA) using RIGHT THIGH GREATER SAPHENOUS VEIN GRAFT (SVG) and LEFT INTERNAL MAMMARY ARTERY (LIMA): LIMA to LAD, SVG to PDA  (N/A) TRANSESOPHAGEAL ECHOCARDIOGRAM (TEE) (N/A)   Stable day NSR w/ stable BP Breathing comfortably w/ O2 sats 94-96% UOP adequate  Plan: Continue current plan  Rexene Alberts, MD 09/26/2018 6:38 PM

## 2018-09-26 NOTE — Progress Notes (Signed)
2 Days Post-Op Procedure(s) (LRB): AORTIC VALVE REPLACEMENT (AVR) using  an INSPIRIS RESILIA  Prosthetic Valve (Model #: 11500A, Size: 23MM, Serial #: [id number]) (N/A) CORONARY ARTERY BYPASS GRAFTING (CABG) x 2; (LIMA to LAD, SVG to PDA) using RIGHT THIGH GREATER SAPHENOUS VEIN GRAFT (SVG) and LEFT INTERNAL MAMMARY ARTERY (LIMA): LIMA to LAD, SVG to PDA  (N/A) TRANSESOPHAGEAL ECHOCARDIOGRAM (TEE) (N/A) Subjective: Multiple complaints this AM C/o migraine- resolved, pain, generally weak  Objective: Vital signs in last 24 hours: Temp:  [98.2 F (36.8 C)-98.6 F (37 C)] 98.4 F (36.9 C) (10/02 0749) Pulse Rate:  [89-93] 93 (10/02 0600) Cardiac Rhythm: Atrial flutter (10/01 1945) Resp:  [13-23] 21 (10/02 0600) BP: (104-141)/(59-79) 136/79 (10/02 0600) SpO2:  [94 %-100 %] 96 % (10/02 0600) Arterial Line BP: (128-149)/(47-54) 144/47 (10/01 1200) Weight:  [105.1 kg] 105.1 kg (10/02 0500)  Hemodynamic parameters for last 24 hours: PAP: (27-43)/(9-25) 30/9  Intake/Output from previous day: 10/01 0701 - 10/02 0700 In: 1195.4 [P.O.:120; I.V.:577.5; IV Piggyback:497.9] Out: 875 [Urine:875] Intake/Output this shift: No intake/output data recorded.  General appearance: alert, cooperative and no distress Neurologic: intact Heart: tachy, slightly irregular Lungs: clear to auscultation bilaterally Abdomen: normal findings: soft, non-tender  Lab Results: Recent Labs    09/25/18 1648 09/25/18 1657 09/26/18 0515  WBC 12.7*  --  11.5*  HGB 8.2* 7.5* 7.8*  HCT 26.6* 22.0* 24.1*  PLT 151  --  PENDING   BMET:  Recent Labs    09/25/18 0420  09/25/18 1657 09/26/18 0515  NA 138  --  138 136  K 3.4*  --  4.1 4.2  CL 110  --  103 104  CO2 25  --   --  23  GLUCOSE 111*  --  112* 113*  BUN 7*  --  7* 7*  CREATININE 0.54   < > 0.50 0.57  CALCIUM 7.7*  --   --  8.6*   < > = values in this interval not displayed.    PT/INR:  Recent Labs    09/24/18 1416  LABPROT 17.6*  INR 1.46    ABG    Component Value Date/Time   PHART 7.373 09/24/2018 1847   HCO3 22.7 09/24/2018 1847   TCO2 25 09/25/2018 1657   ACIDBASEDEF 2.0 09/24/2018 1847   O2SAT 97.0 09/24/2018 1847   CBG (last 3)  Recent Labs    09/25/18 2311 09/26/18 0425 09/26/18 0748  GLUCAP 105* 105* 120*    Assessment/Plan: S/P Procedure(s) (LRB): AORTIC VALVE REPLACEMENT (AVR) using  an INSPIRIS RESILIA  Prosthetic Valve (Model #: 11500A, Size: 23MM, Serial #: [id number]) (N/A) CORONARY ARTERY BYPASS GRAFTING (CABG) x 2; (LIMA to LAD, SVG to PDA) using RIGHT THIGH GREATER SAPHENOUS VEIN GRAFT (SVG) and LEFT INTERNAL MAMMARY ARTERY (LIMA): LIMA to LAD, SVG to PDA  (N/A) TRANSESOPHAGEAL ECHOCARDIOGRAM (TEE) (N/A) -CV- s/p AVR, CABG  Tissue valve- ASA only unless another indication for anticoagulation  Mild tachycardia and PACs this AM- will increase metoprolol, start amiodarone as she did have atrial fib intraoperatively  RESP- continue IS  RENAL- creatinine normal, lytes OK  ENDO- CBG well controlled- will stop levemir  Change CBG and SSI to [facility] and HS  Anemia secondary to ABL- mild, follow  SCD + enoxaparin for DVT prophylaxis  Cardiac rehab   LOS: 2 days    Melrose Nakayama 09/26/2018

## 2018-09-27 LAB — BASIC METABOLIC PANEL
ANION GAP: 7 (ref 5–15)
BUN: 12 mg/dL (ref 8–23)
CALCIUM: 8.4 mg/dL — AB (ref 8.9–10.3)
CO2: 25 mmol/L (ref 22–32)
Chloride: 105 mmol/L (ref 98–111)
Creatinine, Ser: 0.6 mg/dL (ref 0.44–1.00)
GFR calc non Af Amer: >60 mL/min (ref >60)
Glucose, Bld: 101 mg/dL — ABNORMAL HIGH (ref 70–99)
Potassium: 3.9 mmol/L (ref 3.5–5.1)
Sodium: 137 mmol/L (ref 135–145)

## 2018-09-27 LAB — CBC
HEMATOCRIT: 23.7 % — AB (ref 36.0–46.0)
Hemoglobin: 7.3 g/dL — ABNORMAL LOW (ref 12.0–15.0)
MCH: 27.9 pg (ref 26.0–34.0)
MCHC: 30.8 g/dL (ref 30.0–36.0)
MCV: 90.5 fL (ref 78.0–100.0)
PLATELETS: 178 10*3/uL (ref 150–400)
RBC: 2.62 MIL/uL — ABNORMAL LOW (ref 3.87–5.11)
RDW: 14.9 % (ref 11.5–15.5)
WBC: 11.9 10*3/uL — AB (ref 4.0–10.5)

## 2018-09-27 LAB — GLUCOSE, CAPILLARY
Glucose-Capillary: 98 mg/dL (ref 70–99)
Glucose-Capillary: 104 mg/dL — ABNORMAL HIGH (ref 70–99)
Glucose-Capillary: 106 mg/dL — ABNORMAL HIGH (ref 70–99)
Glucose-Capillary: 122 mg/dL — ABNORMAL HIGH (ref 70–99)

## 2018-09-27 MED ORDER — ALUM & MAG HYDROXIDE-SIMETH 200-200-20 MG/5ML PO SUSP: 15.0000 mL | Freq: Four times a day (QID) | ORAL | Status: DC | PRN

## 2018-09-27 MED ORDER — MOVING RIGHT ALONG BOOK
Freq: Once | Status: AC
  Filled 2018-09-27: qty 1

## 2018-09-27 MED ORDER — MAGNESIUM HYDROXIDE 400 MG/5ML PO SUSP
30.0000 mL | Freq: Every day | ORAL | Status: DC | PRN
  Administered 2018-09-28: 30 mL via ORAL
  Filled 2018-09-27: qty 30

## 2018-09-27 MED ORDER — SODIUM CHLORIDE 0.9% FLUSH: 3.0000 mL | INTRAVENOUS | Status: DC | PRN

## 2018-09-27 MED ORDER — SODIUM CHLORIDE 0.9% FLUSH
3.0000 mL | Freq: Two times a day (BID) | INTRAVENOUS | Status: DC
  Administered 2018-09-27 – 2018-09-30 (×4): 3 mL via INTRAVENOUS

## 2018-09-27 MED ORDER — SODIUM CHLORIDE 0.9 % IV SOLN: 250.0000 mL | INTRAVENOUS | Status: DC | PRN

## 2018-09-27 NOTE — Progress Notes (Addendum)
CT surgery p.m. Rounds  Patient examined and record reviewed.Hemodynamics stable,labs satisfactory.Patient had stable day.Continue current care.  Waiting for stepdown bed on 2 L nasal cannula [name] [name] 09/27/2018

## 2018-09-27 NOTE — Discharge Summary (Signed)
Physician Discharge Summary       [address]       [place] [address]             [phone]    Patient ID: [name] [name] MRN: [id number] DOB/AGE: Oct 07, 1946 72 y.o.  Admit date: 09/24/2018 Discharge date: 09/30/2018  Admission Diagnoses: 1. Coronary artery disease 2. Severe aortic stenosis  Discharge Diagnoses:  1. S/P CABG x 2 2. S/P aortic valve replacement with bioprosthetic valve 3. Intra op a fib 4. ABL anemia 5. Tiny lung nodule RUL  6. History of hyperlipemia 7. GERD (gastroesophageal reflux disease) 8. History of hypertension 9. History of kidney stones 10. History of UTI (urinary tract infection) 11. History of gout 12. History of tobacco abuse   Procedure (s):  1. Median sternotomy, extracorporeal circulation.  Aortic valve replacement with 23 mm Edwards Inspiris Resilia pericardial valve (model number 11500A, serial number [id number]). 2. Coronary artery bypass grafting x2 (left internal mammary artery to left anterior descending, saphenous vein graft to posterior descending), endoscopic vein harvest, right thigh by Dr. Roxan Hockey on 09/24/2018.  History of Presenting Illness: [name] [name] is a 72 year old woman with a history of obesity, hypertension, hyperlipidemia, reflux, arthritis, gout, remote TIA and known aortic stenosis.  He has a strong family history of cardiac disease with both parents and a brother having had surgery young ages.  About 3 years ago she had a negative nuclear stress test at [facility].  She developed a heart murmur as an adult.  She was followed by Dr. Gwenlyn Found.  Earlier this year she was noted to have had progression of her aortic stenosis with peak gradient of 70 and a valve area estimated at 1.17 cm.  She recently has been having worsening shortness of breath with exertion.  She says it varies from day-to-day but stiffly worse than it was several months ago.  She will get short of breath with walking up a  flight of stairs but can make it to the top.  She denies any chest pain, pressure, or tightness.  She has not had any syncope or presyncope.  She had a dental cleaning about 3 months ago  She was being evaluated by Dr. Gwenlyn Found with consideration for TAVR.  He did cardiac catheterization and she was found to have total occlusion of her right coronary and a 95% proximal LAD stenosis.  She now is referred for aortic valve replacement and coronary artery bypass grafting. Dr. Roxan Hockey discussed the proposed operation with Ms. Sabourin and her daughter.  We discussed the general nature of the procedure including the need for general anesthesia, the use of cardiopulmonary bypass, the incisions to be used, and the use of drainage tubes postoperatively. We discussed the expected hospital stay, overall recovery and short and long term outcomes.  Dr. Roxan Hockey reviewed the indications, risks, benefits, and alternatives.  They understand the risks and agreed to proceed with surgery. CT of the chest showed the ascending thoracic aorta is upper limits of normal in caliber measuring 3.9 cm. Aortic Atherosclerosis. Tiny solid nodule within the right upper lobe measures 3 mm. No follow-up needed if patient is low-risk. Non-contrast chest CT can be considered in 12 months if patient is high-riskPre operative carotid duplex US showed no significant internal carotid artery stenosis bilaterally. She underwent a CABG x 2 and AVR on 09/24/2018.  Brief Hospital Course:  The patient was extubated the evening of surgery without difficulty. He/she remained afebrile and hemodynamically stable. She  was A paced initially. Ortho stasis on post op day one. She was felt intravascularly dry. She was given Albumin with improvement. She did pass out later in the am.  Gordy Councilman, a line, chest tubes, and foley were removed early in the post operative course. Lopressor was started and titrated accordingly. Amiodarone was started as she had had  intra op a fib. She was volume over loaded and diuresed. She had ABL anemia. She did not require a post op transfusion. Last H and H was up to 7.8 and 26.3. She was continued on ferrous sulfate. She was weaned off the insulin drip.  Once she was tolerating a diet, home diabetic medicines were restarted.  The patient's glucose remained well controlled.The patient's HGA1C pre op was 5.8. She likely has pre diabetes. She will need follow up with her medical doctor after discharge. Nutrition recommendations have been included in her discharge paperwork. The patient was felt surgically stable for transfer from the ICU to [facility] for further convalescence on 09/28/2018. She continues to progress with cardiac rehab. She remains in sinus rhythm. She was hypertensive so Losartan was restarted and titrated accordingly. She was ambulating on room air. She has been tolerating a diet and has had a bowel movement. Epicardial pacing wires were removed on 09/29/2018. Chest tube sutures will be removed the day of discharge. The patient is felt surgically stable for discharge today.   Latest Vital Signs: Blood pressure (!) 178/83, pulse 85, temperature 98 F (36.7 C), temperature source Oral, resp. rate 15, height 5\' 5"  (1.651 m), weight 102.7 kg, SpO2 95 %.  Physical Exam: Cardiovascular: RRR Pulmonary: Diminished at bases Abdomen: Soft, non tender, obese, bowel sounds present. Extremities: Mild bilateral lower extremity edema. Wounds: Clean and dry.  No erythema or signs of infection.   Discharge Condition: Stable and discharged to home.  Recent laboratory studies:  Lab Results  Component Value Date   WBC 10.3 09/30/2018   HGB 7.8 (L) 09/30/2018   HCT 26.3 (L) 09/30/2018   MCV 92.9 09/30/2018   PLT 312 09/30/2018   Lab Results  Component Value Date   NA 140 09/30/2018   K 3.8 09/30/2018   CL 103 09/30/2018   CO2 28 09/30/2018   CREATININE 0.77 09/30/2018   GLUCOSE 115 (H) 09/30/2018    Diagnostic  Studies: Dg Chest 2 View  Result Date: 09/28/2018 CLINICAL DATA:  Soreness status post CABG EXAM: CHEST - 2 VIEW COMPARISON:  Chest x-ray dated 09/26/2018. FINDINGS: Heart size and mediastinal contours are stable. Median sternotomy wires appear intact and stable in alignment. Small LEFT pleural effusion with probable associated atelectasis. Lungs otherwise clear. No pneumothorax seen. No acute appearing osseous abnormality. IMPRESSION: 1. Small pleural effusion with probable mild atelectasis at the LEFT lung base. Lungs otherwise clear. 2. No pneumothorax. Electronically Signed   By: Franki Cabot M.D.   On: 09/28/2018 08:46   Ct Chest Wo Contrast  Result Date: 09/21/2018 CLINICAL DATA:  Aortic stenosis.  Preop aortic valve repair EXAM: CT CHEST WITHOUT CONTRAST TECHNIQUE: Multidetector CT imaging of the chest was performed following the standard protocol without IV contrast. COMPARISON:  None FINDINGS: Cardiovascular: The heart size is mildly enlarged. No pericardial effusion. Calcifications of the aortic valve identified. The aortic root measures 3.2 cm in maximum diameter, image 96/4. The ascending thoracic aorta Measures 3.9 cm in maximum diameter. Transverse thoracic aorta measures 2.9 cm, image 119/6. At the level of the hiatus the aorta measures 2.5 cm. Calcifications within the  LAD, left circumflex and RCA noted. Mediastinum/Nodes: Normal appearance of the thyroid gland. The trachea appears patent and is midline. Small hiatal hernia. No enlarged axillary or mediastinal lymph nodes. Lungs/Pleura: No pleural effusions. No airspace consolidation, atelectasis or pneumothorax. There is a tiny ground-glass nodule within the left apex which measures 6 mm, image 20/8. Scar noted within the lingula and anterior left base. Small solid nodule within the anterior right upper lobe measures 3 mm, image 29/8. Upper Abdomen: No acute abnormality identified within the upper abdomen. Calcified lesion within the  posteromedial right lobe of liver measures 1.9 cm, image 133/2. Nonspecific. Aortic atherosclerosis noted. Musculoskeletal: Spondylosis identified within the thoracic spine. No aggressive lytic or sclerotic bone lesions. IMPRESSION: 1. No acute cardiopulmonary abnormalities identified. 2. Aortic atherosclerosis and calcification of the aortic valve. The ascending thoracic aorta is upper limits of normal in caliber measuring 3.9 cm. Aortic Atherosclerosis (ICD10-I70.0). 3. Three vessel coronary artery atherosclerotic calcifications noted. 4. Ground-glass nodule within the left apex measures 6 mm. Initial follow-up with CT at 6-12 months is recommended to confirm persistence. If persistent, repeat CT is recommended every 2 years until 5 years of stability has been established. This recommendation follows the consensus statement: Guidelines for Management of Incidental Pulmonary Nodules Detected on CT Images: From the Fleischner Society 2017; Radiology 2017; 284:228-243. 5. Tiny solid nodule within the right upper lobe measures 3 mm. No follow-up needed if patient is low-risk. Non-contrast chest CT can be considered in 12 months if patient is high-risk. This recommendation follows the consensus statement: Guidelines for Management of Incidental Pulmonary Nodules Detected on CT Images: From the Fleischner Society 2017; Radiology 2017; 284:228-243. Electronically Signed   By: Kerby Moors M.D.   On: 09/21/2018 13:43   Discharge Instructions    Amb Referral to Cardiac Rehabilitation   Complete by:  As directed    Diagnosis:   Valve Replacement CABG     Valve:  Aortic   CABG X ___:  2      Discharge Medications: Allergies as of 09/30/2018      Reactions   Amoxicillin Rash   Has patient had a PCN reaction causing immediate rash, facial/tongue/throat swelling, SOB or lightheadedness with hypotension: No Has patient had a PCN reaction causing severe rash involving mucus membranes or skin necrosis: No Has  patient had a PCN reaction that required hospitalization: No Has patient had a PCN reaction occurring within the last 10 years: No If all of the above answers are "NO", then may proceed with Cephalosporin use.   Amoxicillin-pot Clavulanate Rash   Has patient had a PCN reaction causing immediate rash, facial/tongue/throat swelling, SOB or lightheadedness with hypotension: No Has patient had a PCN reaction causing severe rash involving mucus membranes or skin necrosis: No Has patient had a PCN reaction that required hospitalization: No Has patient had a PCN reaction occurring within the last 10 years: No If all of the above answers are "NO", then may proceed with Cephalosporin use.   Ampicillin Rash   Clarithromycin Rash   Clopidogrel Bisulfate Rash   Nitrofurantoin Itching   Plavix [clopidogrel Bisulfate] Rash   Zestril [lisinopril] Rash      Medication List    STOP taking these medications   amLODipine 10 MG tablet Commonly known as:  NORVASC   hydrALAZINE 10 MG tablet Commonly known as:  APRESOLINE   losartan-hydrochlorothiazide 100-12.5 MG tablet Commonly known as:  HYZAAR     TAKE these medications   acetaminophen 650 MG CR tablet  Commonly known as:  TYLENOL Take 1,300 mg by mouth every 8 (eight) hours as needed for pain.   allopurinol 100 MG tablet Commonly known as:  ZYLOPRIM Take 100 mg by mouth daily.   amiodarone 200 MG tablet Commonly known as:  PACERONE Take 1 tablet (200 mg total) by mouth 2 (two) times daily. For one week then take 200 mg by mouth daily thereafter   aspirin 325 MG EC tablet Take 1 tablet (325 mg total) by mouth daily.   atorvastatin 10 MG tablet Commonly known as:  LIPITOR Take 1 tablet (10 mg total) by mouth daily at 6 PM.   dicyclomine 10 MG capsule Commonly known as:  BENTYL Take 10 mg by mouth 4 (four) times daily as needed for spasms.   ferrous sulfate 325 (65 FE) MG tablet Take 1 tablet (325 mg total) by mouth daily with  breakfast. For one month then stop.   furosemide 40 MG tablet Commonly known as:  LASIX Take 1 tablet (40 mg total) by mouth daily. For 10 days then stop.   lansoprazole 30 MG capsule Commonly known as:  PREVACID Take 30 mg by mouth daily as needed (heartburn).   levothyroxine 125 MCG tablet Commonly known as:  SYNTHROID, LEVOTHROID Take 125 mcg by mouth daily before breakfast.   losartan 50 MG tablet Commonly known as:  COZAAR Take 1 tablet (50 mg total) by mouth daily.   meclizine 25 MG tablet Commonly known as:  ANTIVERT Take 25 mg by mouth 3 (three) times daily as needed for dizziness.   metoprolol tartrate 25 MG tablet Commonly known as:  LOPRESSOR Take 1 tablet (25 mg total) by mouth 2 (two) times daily.   Potassium Chloride ER 20 MEQ Tbcr Take 20 mEq by mouth daily. For 10 days then stop.   SALONPAS PAIN RELIEF PATCH EX Apply 1 patch topically daily as needed (pain).   SYSTANE BALANCE 0.6 % Soln Generic drug:  Propylene Glycol Place 1 drop into both eyes 2 (two) times daily as needed (dry eyes).   traMADol 50 MG tablet Commonly known as:  ULTRAM Take 50 mg by mouth every 4-6 hours PRN severe pain What changed:    how much to take  how to take this  when to take this  reasons to take this  additional instructions   trimethoprim 100 MG tablet Commonly known as:  TRIMPEX Take 100 mg by mouth daily as needed (For UTI Prevention).   Vitamin D 2000 units tablet Take 1 tablet by mouth daily.      The patient has been discharged on:   1.Beta Blocker:  Yes [ x  ]                              No   [   ]                              If No, reason:  2.Ace Inhibitor/ARB: Yes [ x  ]                                     No  [    ]  If No, reason:  3.Statin:   Yes [   ]                  No  [   ]                  If No, reason:  4.Ecasa:  Yes  [ x  ]                  No   [   ]                  If No,  reason:  Follow Up Appointments: Follow-up Information    Melrose Nakayama, MD. Go on 10/30/2018.   Specialty:  Cardiothoracic Surgery Why:  PA/LAT CXR to be taken (at [facility] which is in the same building as Dr. Leonarda Salon office) on 10/30/2018 at 3:15 pm;Appointment time is at 3:34 pm Contact information: [place] [address] [phone]        Barrett, Evelene Croon, PA-C. Go on 10/11/2018.   Specialties:  Cardiology, Radiology Why:  Appointment time is at 11:00 am Contact information: [address] [place] [address] [phone]        Jamesetta Orleans, PA-C. Call.   Specialty:  Internal Medicine Why:  for a follow up appointment regarding further surveillance of HGA1C 5.8 (pre diabetes) Contact information: [place] [address] [phone]           Signed: Sharalyn Ink [facility] 09/30/2018, 9:30 AM

## 2018-09-27 NOTE — Plan of Care (Signed)
  Problem: Education: Goal: Knowledge of General Education information will improve Description Including pain rating scale, medication(s)/side effects and non-pharmacologic comfort measures Outcome: Progressing   Problem: Health Behavior/Discharge Planning: Goal: Ability to manage health-related needs will improve Outcome: Progressing   Problem: Clinical Measurements: Goal: Ability to maintain clinical measurements within normal limits will improve Outcome: Progressing Goal: Will remain free from infection Outcome: Progressing Goal: Diagnostic test results will improve Outcome: Progressing Goal: Respiratory complications will improve Outcome: Progressing Goal: Cardiovascular complication will be avoided Outcome: Progressing   Problem: Activity: Goal: Risk for activity intolerance will decrease Outcome: Progressing   Problem: Nutrition: Goal: Adequate nutrition will be maintained Outcome: Progressing   Problem: Pain Managment: Goal: General experience of comfort will improve Outcome: Progressing   Problem: Safety: Goal: Ability to remain free from injury will improve Outcome: Progressing   Problem: Skin Integrity: Goal: Risk for impaired skin integrity will decrease Outcome: Progressing   Problem: Education: Goal: Knowledge of disease or condition will improve Outcome: Progressing Goal: Knowledge of the prescribed therapeutic regimen will improve Outcome: Progressing

## 2018-09-27 NOTE — Progress Notes (Signed)
[facility] DAILY ICU PROGRESS NOTE                   [address]            [place] [address]          [phone]   3 Days Post-Op Procedure(s) (LRB): AORTIC VALVE REPLACEMENT (AVR) using  an INSPIRIS RESILIA  Prosthetic Valve (Model #: 11500A, Size: 23MM, Serial #: [id number]) (N/A) CORONARY ARTERY BYPASS GRAFTING (CABG) x 2; (LIMA to LAD, SVG to PDA) using RIGHT THIGH GREATER SAPHENOUS VEIN GRAFT (SVG) and LEFT INTERNAL MAMMARY ARTERY (LIMA): LIMA to LAD, SVG to PDA  (N/A) TRANSESOPHAGEAL ECHOCARDIOGRAM (TEE) (N/A)  Total Length of Stay:  LOS: 3 days   Subjective: Feels weak and shakey  Objective: Vital signs in last 24 hours: Temp:  [97.6 F (36.4 C)-98.7 F (37.1 C)] 98.7 F (37.1 C) (10/03 0754) Pulse Rate:  [73-105] 75 (10/03 0600) Cardiac Rhythm: Normal sinus rhythm (10/03 0000) Resp:  [13-29] 16 (10/03 0600) BP: (99-133)/(61-83) 128/63 (10/03 0600) SpO2:  [93 %-100 %] 100 % (10/03 0600) Weight:  [106 kg] 106 kg (10/03 0500)  Filed Weights   09/25/18 0500 09/26/18 0500 09/27/18 0500  Weight: 104.9 kg 105.1 kg 106 kg    Weight change: 0.9 kg   Hemodynamic parameters for last 24 hours:    Intake/Output from previous day: 10/02 0701 - 10/03 0700 In: 252.7 [P.O.:170; I.V.:82.7] Out: 625 [Urine:625]  Intake/Output this shift: No intake/output data recorded.  Current Meds: Scheduled Meds: . acetaminophen  1,000 mg Oral Q6H   Or  . acetaminophen (TYLENOL) oral liquid 160 mg/5 mL  1,000 mg Per Tube Q6H  . allopurinol  100 mg Oral Daily  . amiodarone  400 mg Oral BID  . aspirin EC  325 mg Oral Daily   Or  . aspirin  324 mg Per Tube Daily  . bisacodyl  10 mg Oral Daily   Or  . bisacodyl  10 mg Rectal Daily  . Chlorhexidine Gluconate Cloth  6 each Topical Daily  . docusate sodium  200 mg Oral Daily  . enoxaparin (LOVENOX) injection  40 mg Subcutaneous QHS  . insulin aspart  0-15 Units Subcutaneous TID WC  . insulin aspart  0-5 Units  Subcutaneous QHS  . levothyroxine  125 mcg Oral QAC breakfast  . mouth rinse  15 mL Mouth Rinse BID  . metoprolol tartrate  25 mg Oral BID   Or  . metoprolol tartrate  25 mg Per Tube BID  . pantoprazole  40 mg Oral Daily  . sodium chloride flush  10-40 mL Intracatheter Q12H  . sodium chloride flush  3 mL Intravenous Q12H   Continuous Infusions: . sodium chloride Stopped (09/25/18 0737)  . sodium chloride    . sodium chloride Stopped (09/25/18 1020)  . dexmedetomidine (PRECEDEX) IV infusion Stopped (09/25/18 0458)  . lactated ringers    . lactated ringers Stopped (09/26/18 1108)  . lactated ringers     PRN Meds:.sodium chloride, dicyclomine, lactated ringers, meclizine, metoprolol tartrate, ondansetron (ZOFRAN) IV, oxyCODONE, polyvinyl alcohol, sodium chloride flush, sodium chloride flush, traMADol  General appearance: alert and cooperative Heart: irregularly irregular rhythm Lungs: clear to auscultation bilaterally Abdomen: soft, nontender, obese Extremities: minor edema Wound: evh site ok, chest dressing CDI  Lab Results: CBC: Recent Labs    09/26/18 0515 09/27/18 0330  WBC 11.5* 11.9*  HGB 7.8* 7.3*  HCT 24.1* 23.7*  PLT 124* 178   BMET:  Recent Labs    09/26/18 0515 09/27/18 0330  NA 136 137  K 4.2 3.9  CL 104 105  CO2 23 25  GLUCOSE 113* 101*  BUN 7* 12  CREATININE 0.57 0.60  CALCIUM 8.6* 8.4*    CMET: Lab Results  Component Value Date   WBC 11.9 (H) 09/27/2018   HGB 7.3 (L) 09/27/2018   HCT 23.7 (L) 09/27/2018   PLT 178 09/27/2018   GLUCOSE 101 (H) 09/27/2018   CHOL 229 (H) 09/05/2018   TRIG 284 (H) 09/05/2018   HDL 41 09/05/2018   LDLCALC 131 (H) 09/05/2018   ALT 23 09/21/2018   AST 24 09/21/2018   NA 137 09/27/2018   K 3.9 09/27/2018   CL 105 09/27/2018   CREATININE 0.60 09/27/2018   BUN 12 09/27/2018   CO2 25 09/27/2018   TSH 0.474 09/05/2018   INR 1.46 09/24/2018   HGBA1C 5.8 (H) 09/21/2018      PT/INR:  Recent Labs     09/24/18 1416  LABPROT 17.6*  INR 1.46   Radiology: No results found.   Assessment/Plan: S/P Procedure(s) (LRB): AORTIC VALVE REPLACEMENT (AVR) using  an INSPIRIS RESILIA  Prosthetic Valve (Model #: 11500A, Size: 23MM, Serial #: [id number]) (N/A) CORONARY ARTERY BYPASS GRAFTING (CABG) x 2; (LIMA to LAD, SVG to PDA) using RIGHT THIGH GREATER SAPHENOUS VEIN GRAFT (SVG) and LEFT INTERNAL MAMMARY ARTERY (LIMA): LIMA to LAD, SVG to PDA  (N/A) TRANSESOPHAGEAL ECHOCARDIOGRAM (TEE) (N/A)  1 conts to make good progress 2 hemodyn stable in sinus with freq PAC's, on amio and metoprolol 3 Renal fxn stable, some volume overload, will diurese  4 H/H - Significant  Expected ABL anemia- monitor, approaching transfusion threshold 5 thrombocytopenia improved 6 sugars controlled, HgA1c 5.8- monitor, needs aggressive livestyle management for morbid obesity/metabolic syndrome 7 mobilize/pulm toilet 8 tx to 4e John Giovanni PA-C 09/27/2018 8:11 AM  Pager [phone]

## 2018-09-28 ENCOUNTER — Inpatient Hospital Stay (HOSPITAL_COMMUNITY): Payer: PPO

## 2018-09-28 LAB — CBC
HCT: 25.4 % — ABNORMAL LOW (ref 36.0–46.0)
HEMOGLOBIN: 7.7 g/dL — AB (ref 12.0–15.0)
MCH: 27.6 pg (ref 26.0–34.0)
MCHC: 30.3 g/dL (ref 30.0–36.0)
MCV: 91 fL (ref 78.0–100.0)
PLATELETS: 224 10*3/uL (ref 150–400)
RBC: 2.79 MIL/uL — AB (ref 3.87–5.11)
RDW: 15 % (ref 11.5–15.5)
WBC: 10.3 10*3/uL (ref 4.0–10.5)

## 2018-09-28 LAB — GLUCOSE, CAPILLARY
GLUCOSE-CAPILLARY: 80 mg/dL (ref 70–99)
GLUCOSE-CAPILLARY: 111 mg/dL — AB (ref 70–99)
Glucose-Capillary: 109 mg/dL — ABNORMAL HIGH (ref 70–99)

## 2018-09-28 LAB — BASIC METABOLIC PANEL
Anion gap: 7 (ref 5–15)
BUN: 13 mg/dL (ref 8–23)
CALCIUM: 8.8 mg/dL — AB (ref 8.9–10.3)
CHLORIDE: 106 mmol/L (ref 98–111)
CO2: 26 mmol/L (ref 22–32)
CREATININE: 0.72 mg/dL (ref 0.44–1.00)
GFR calc non Af Amer: >60 mL/min (ref >60)
Glucose, Bld: 123 mg/dL — ABNORMAL HIGH (ref 70–99)
POTASSIUM: 3.8 mmol/L (ref 3.5–5.1)
SODIUM: 139 mmol/L (ref 135–145)

## 2018-09-28 MED ORDER — FOLIC ACID 1 MG PO TABS
1.0000 mg | ORAL_TABLET | Freq: Every day | ORAL | Status: DC
  Administered 2018-09-28 – 2018-09-30 (×3): 1 mg via ORAL
  Filled 2018-09-28 (×3): qty 1

## 2018-09-28 MED ORDER — POTASSIUM CHLORIDE CRYS ER 20 MEQ PO TBCR
20.0000 meq | EXTENDED_RELEASE_TABLET | Freq: Once | ORAL | Status: AC
  Administered 2018-09-28: 20 meq via ORAL
  Filled 2018-09-28: qty 1

## 2018-09-28 MED ORDER — FERROUS SULFATE 325 (65 FE) MG PO TABS
325.0000 mg | ORAL_TABLET | Freq: Every day | ORAL | Status: DC
  Administered 2018-09-28 – 2018-09-30 (×3): 325 mg via ORAL
  Filled 2018-09-28 (×3): qty 1

## 2018-09-28 MED ORDER — ATORVASTATIN CALCIUM 10 MG PO TABS
10.0000 mg | ORAL_TABLET | Freq: Every day | ORAL | Status: DC
  Administered 2018-09-28 – 2018-09-29 (×2): 10 mg via ORAL
  Filled 2018-09-28 (×2): qty 1

## 2018-09-28 MED ORDER — FUROSEMIDE 10 MG/ML IJ SOLN
40.0000 mg | Freq: Once | INTRAMUSCULAR | Status: AC
  Administered 2018-09-28: 40 mg via INTRAVENOUS
  Filled 2018-09-28: qty 4

## 2018-09-28 NOTE — Progress Notes (Addendum)
[facility] DAILY ICU PROGRESS NOTE                   [address]            [place] [address]          [phone]   4 Days Post-Op Procedure(s) (LRB): AORTIC VALVE REPLACEMENT (AVR) using  an INSPIRIS RESILIA  Prosthetic Valve (Model #: 11500A, Size: 23MM, Serial #: [id number]) (N/A) CORONARY ARTERY BYPASS GRAFTING (CABG) x 2; (LIMA to LAD, SVG to PDA) using RIGHT THIGH GREATER SAPHENOUS VEIN GRAFT (SVG) and LEFT INTERNAL MAMMARY ARTERY (LIMA): LIMA to LAD, SVG to PDA  (N/A) TRANSESOPHAGEAL ECHOCARDIOGRAM (TEE) (N/A)  Total Length of Stay:  LOS: 4 days   Subjective: Patient states she has been up since 4 am when she had to go to the bathroom. She then had to walk and has now been in chair and her back hurts (had back pain prior to surgery).  Objective: Vital signs in last 24 hours: Temp:  [97.7 F (36.5 C)-98.4 F (36.9 C)] 98.4 F (36.9 C) (10/04 0738) Pulse Rate:  [69-94] 73 (10/04 0400) Cardiac Rhythm: Normal sinus rhythm (10/04 0000) Resp:  [14-30] 18 (10/04 0600) BP: (106-151)/(60-85) 151/85 (10/04 0400) SpO2:  [0 %-98 %] 95 % (10/04 0400) FiO2 (%):  [0 %] 0 % (10/03 1329) Weight:  [105.3 kg] 105.3 kg (10/04 0400)  Filed Weights   09/26/18 0500 09/27/18 0500 09/28/18 0400  Weight: 105.1 kg 106 kg 105.3 kg    Weight change: -0.7 kg      Intake/Output from previous day: 10/03 0701 - 10/04 0700 In: 1010 [P.O.:1010] Out: 100 [Urine:100]  Intake/Output this shift: No intake/output data recorded.  Current Meds: Scheduled Meds: . acetaminophen  1,000 mg Oral Q6H   Or  . acetaminophen (TYLENOL) oral liquid 160 mg/5 mL  1,000 mg Per Tube Q6H  . allopurinol  100 mg Oral Daily  . amiodarone  400 mg Oral BID  . aspirin EC  325 mg Oral Daily   Or  . aspirin  324 mg Per Tube Daily  . bisacodyl  10 mg Oral Daily   Or  . bisacodyl  10 mg Rectal Daily  . docusate sodium  200 mg Oral Daily  . enoxaparin (LOVENOX) injection  40 mg Subcutaneous QHS  .  insulin aspart  0-15 Units Subcutaneous TID WC  . insulin aspart  0-5 Units Subcutaneous QHS  . levothyroxine  125 mcg Oral QAC breakfast  . metoprolol tartrate  25 mg Oral BID   Or  . metoprolol tartrate  25 mg Per Tube BID  . pantoprazole  40 mg Oral Daily  . sodium chloride flush  3 mL Intravenous Q12H   Continuous Infusions: . sodium chloride     PRN Meds:.sodium chloride, alum & mag hydroxide-simeth, dicyclomine, magnesium hydroxide, meclizine, metoprolol tartrate, ondansetron (ZOFRAN) IV, oxyCODONE, polyvinyl alcohol, sodium chloride flush, traMADol  General appearance: alert, cooperative and no distress Neurologic: intact Heart: RRR, no murmur Lungs: Diminshed at bases Abdomen: Soft, obese, non tender, bowel sounds present Extremities: Bilateral LE edema Wounds: Clean and dry  Lab Results: CBC: Recent Labs    09/26/18 0515 09/27/18 0330  WBC 11.5* 11.9*  HGB 7.8* 7.3*  HCT 24.1* 23.7*  PLT 124* 178   BMET:  Recent Labs    09/26/18 0515 09/27/18 0330  NA 136 137  K 4.2 3.9  CL 104 105  CO2 23 25  GLUCOSE 113* 101*  BUN 7* 12  CREATININE 0.57 0.60  CALCIUM 8.6* 8.4*    CMET: Lab Results  Component Value Date   WBC 11.9 (H) 09/27/2018   HGB 7.3 (L) 09/27/2018   HCT 23.7 (L) 09/27/2018   PLT 178 09/27/2018   GLUCOSE 101 (H) 09/27/2018   CHOL 229 (H) 09/05/2018   TRIG 284 (H) 09/05/2018   HDL 41 09/05/2018   LDLCALC 131 (H) 09/05/2018   ALT 23 09/21/2018   AST 24 09/21/2018   NA 137 09/27/2018   K 3.9 09/27/2018   CL 105 09/27/2018   CREATININE 0.60 09/27/2018   BUN 12 09/27/2018   CO2 25 09/27/2018   TSH 0.474 09/05/2018   INR 1.46 09/24/2018   HGBA1C 5.8 (H) 09/21/2018      PT/INR: No results for input(s): LABPROT, INR in the last 72 hours. Radiology: No results found.   Assessment/Plan: S/P Procedure(s) (LRB): AORTIC VALVE REPLACEMENT (AVR) using  an INSPIRIS RESILIA  Prosthetic Valve (Model #: 11500A, Size: 23MM, Serial #: [id number])  (N/A) CORONARY ARTERY BYPASS GRAFTING (CABG) x 2; (LIMA to LAD, SVG to PDA) using RIGHT THIGH GREATER SAPHENOUS VEIN GRAFT (SVG) and LEFT INTERNAL MAMMARY ARTERY (LIMA): LIMA to LAD, SVG to PDA  (N/A) TRANSESOPHAGEAL ECHOCARDIOGRAM (TEE) (N/A)  1. CV-A fib intra op. Had mild tachycardia and PACs yesterday. SR this am. On Lopressor 25 mg bid and Amiodarone 400 mg bid. 2. Pulmonary-on room air. Await this am's CXR. Encourage incentive spirometer and flutter valve. 3. Volume overload-will give Lasix 40 mg IV this am 4. ABL anemia- H and H yesterday 7.3 and 23.7. Start folic acid and oral ferrous. 5. Hypothyroidism-on Levothyroxine 125 mcg daily 6. Await am lab results 7. She is not on a statin and no allergy so will start 8. Transfer to 4E-waiting on bed    Donielle Liston Alba PA-C 09/28/2018 8:02 AM    Chart reviewed, patient examined, agree with above. She is progressing slowly.  Hgb this am is 7.7, up from 7.3 yesterday. Her BP is fine so I think we can avoid transfusion. Iron started and would minimize blood drawing and check it again just before discharge. Weight is just about at preop. Given lasix this am. CXR this am shows small left effusion and left base atelectasis. Continue IS and ambulation.

## 2018-09-28 NOTE — Progress Notes (Signed)
Pt took 3rd walk this pm. Approx 500 ft w/ rolling walker. On room air. Pt tolerated well. Assisted back into bed w/ call bell in reach.

## 2018-09-28 NOTE — Progress Notes (Signed)
CARDIAC REHAB PHASE I   Offered to walk with pt, pt declining at this time stating she just moved rooms and didn't get her lunch. Jello and apple juice set up for pt. Pt encouraged to walk 3x/d, sit in the chair for meals and use IS. Pt demonstrates 500 on IS. Call bell within reach.  [phone] Rufina Falco, RN BSN 09/28/2018 2:29 PM

## 2018-09-28 NOTE — Progress Notes (Signed)
Report called to RN 4E. Will transfer in [facility] with RN. Patient with no complaints at the current time.

## 2018-09-29 LAB — GLUCOSE, CAPILLARY
GLUCOSE-CAPILLARY: 117 mg/dL — AB (ref 70–99)
GLUCOSE-CAPILLARY: 94 mg/dL (ref 70–99)
Glucose-Capillary: 117 mg/dL — ABNORMAL HIGH (ref 70–99)

## 2018-09-29 MED ORDER — LOSARTAN POTASSIUM 25 MG PO TABS
25.0000 mg | ORAL_TABLET | Freq: Every day | ORAL | Status: DC
  Administered 2018-09-29: 25 mg via ORAL
  Filled 2018-09-29: qty 1

## 2018-09-29 MED ORDER — POTASSIUM CHLORIDE CRYS ER 10 MEQ PO TBCR
EXTENDED_RELEASE_TABLET | ORAL | Status: AC
  Filled 2018-09-29: qty 2

## 2018-09-29 MED ORDER — POTASSIUM CHLORIDE CRYS ER 20 MEQ PO TBCR
20.0000 meq | EXTENDED_RELEASE_TABLET | Freq: Once | ORAL | Status: AC
  Administered 2018-09-29: 20 meq via ORAL
  Filled 2018-09-29: qty 1

## 2018-09-29 MED ORDER — FUROSEMIDE 10 MG/ML IJ SOLN
40.0000 mg | Freq: Once | INTRAMUSCULAR | Status: AC
  Administered 2018-09-29: 40 mg via INTRAVENOUS
  Filled 2018-09-29: qty 4

## 2018-09-29 NOTE — Progress Notes (Signed)
EPW removed per order; tips intact; VSS. Pt tolerated well. Steri strips applied. Pt educated to remain on bed rest for 1 hour. Will continue to monitor.   Lilla Shook, BSN

## 2018-09-29 NOTE — Progress Notes (Addendum)
      [address]       [place] [address]             [phone]        5 Days Post-Op Procedure(s) (LRB): AORTIC VALVE REPLACEMENT (AVR) using  an INSPIRIS RESILIA  Prosthetic Valve (Model #: 11500A, Size: 23MM, Serial #: [id number]) (N/A) CORONARY ARTERY BYPASS GRAFTING (CABG) x 2; (LIMA to LAD, SVG to PDA) using RIGHT THIGH GREATER SAPHENOUS VEIN GRAFT (SVG) and LEFT INTERNAL MAMMARY ARTERY (LIMA): LIMA to LAD, SVG to PDA  (N/A) TRANSESOPHAGEAL ECHOCARDIOGRAM (TEE) (N/A)  Subjective: Patient does not like food. She has had a bowel movement.  Objective: Vital signs in last 24 hours: Temp:  [97.6 F (36.4 C)-98.9 F (37.2 C)] 98.6 F (37 C) (10/05 0517) Pulse Rate:  [77-88] 79 (10/05 0517) Cardiac Rhythm: Normal sinus rhythm (10/04 1900) Resp:  [15-20] 20 (10/05 0517) BP: (124-140)/(65-75) 140/75 (10/05 0517) SpO2:  [97 %-98 %] 98 % (10/05 0517) Weight:  [104.2 kg] 104.2 kg (10/05 0517)  Pre op weight 100.6 kg Current Weight  09/29/18 104.2 kg       Intake/Output from previous day: 10/04 0701 - 10/05 0700 In: 480 [P.O.:480] Out: 700 [Urine:700]   Physical Exam:  Cardiovascular: RRR Pulmonary: Diminished at bases Abdomen: Soft, non tender, obese, bowel sounds present. Extremities: Mild bilateral lower extremity edema. Wounds: Clean and dry.  No erythema or signs of infection.  Lab Results: CBC: Recent Labs    09/27/18 0330 09/28/18 0735  WBC 11.9* 10.3  HGB 7.3* 7.7*  HCT 23.7* 25.4*  PLT 178 224   BMET:  Recent Labs    09/27/18 0330 09/28/18 0735  NA 137 139  K 3.9 3.8  CL 105 106  CO2 25 26  GLUCOSE 101* 123*  BUN 12 13  CREATININE 0.60 0.72  CALCIUM 8.4* 8.8*    PT/INR:  Lab Results  Component Value Date   INR 1.46 09/24/2018   INR 0.95 09/21/2018   INR 0.93 07/17/2017   ABG:  INR: Will add last result for INR, ABG once components are confirmed Will add last 4 CBG results once components are  confirmed  Assessment/Plan: 1. CV-A fib intra op. Had mild tachycardia and PACs previously. SR this am. On Lopressor 25 mg bid and Amiodarone 400 mg bid. Will restart low dose Losartan for better BP control. 2. Pulmonary-On room air. Encourage incentive spirometer and flutter valve. 3. Volume overload-will give Lasix 40 mg IV again this am 4. ABL anemia- H and H yesterday 7.3 and 23.7. Continue folic acid and oral ferrous. 5. Hypothyroidism-on Levothyroxine 125 mcg daily 6. Check BMET and CBC in am 7. Remove EPW 8. Possibly home in 1-2 days  Sharalyn Ink [facility] 09/29/2018,8:22 AM [phone]   Chart reviewed, patient examined, agree with above. She feels better and is ambulating down the hall. She can go home in the am if no change.

## 2018-09-29 NOTE — Progress Notes (Signed)
CARDIAC REHAB PHASE I   2:05p-2:25p Patient has already done 2 walks today. Pacing wires just pulled. Attempted education but patient asked me to leave because she could "not comprehend education" at this time. Papers left. Order for Cardiac Rehab placed. Daughter in room.  [place], MS 09/29/2018 2:20 PM

## 2018-09-30 LAB — BASIC METABOLIC PANEL
Anion gap: 9 (ref 5–15)
BUN: 13 mg/dL (ref 8–23)
CALCIUM: 9 mg/dL (ref 8.9–10.3)
CHLORIDE: 103 mmol/L (ref 98–111)
CO2: 28 mmol/L (ref 22–32)
CREATININE: 0.77 mg/dL (ref 0.44–1.00)
GFR calc non Af Amer: >60 mL/min (ref >60)
Glucose, Bld: 115 mg/dL — ABNORMAL HIGH (ref 70–99)
Potassium: 3.8 mmol/L (ref 3.5–5.1)
Sodium: 140 mmol/L (ref 135–145)

## 2018-09-30 LAB — CBC
HEMATOCRIT: 26.3 % — AB (ref 36.0–46.0)
HEMOGLOBIN: 7.8 g/dL — AB (ref 12.0–15.0)
MCH: 27.6 pg (ref 26.0–34.0)
MCHC: 29.7 g/dL — AB (ref 30.0–36.0)
MCV: 92.9 fL (ref 78.0–100.0)
Platelets: 312 10*3/uL (ref 150–400)
RBC: 2.83 MIL/uL — ABNORMAL LOW (ref 3.87–5.11)
RDW: 15.6 % — AB (ref 11.5–15.5)
WBC: 10.3 10*3/uL (ref 4.0–10.5)

## 2018-09-30 LAB — GLUCOSE, CAPILLARY: Glucose-Capillary: 116 mg/dL — ABNORMAL HIGH (ref 70–99)

## 2018-09-30 MED ORDER — ATORVASTATIN CALCIUM 10 MG PO TABS: 10.0000 mg | ORAL_TABLET | Freq: Every day | ORAL | 1 refills | Status: DC

## 2018-09-30 MED ORDER — TRAMADOL HCL 50 MG PO TABS: ORAL_TABLET | ORAL | 0 refills | Status: AC

## 2018-09-30 MED ORDER — FERROUS SULFATE 325 (65 FE) MG PO TABS: 325.0000 mg | ORAL_TABLET | Freq: Every day | ORAL | 0 refills | Status: DC

## 2018-09-30 MED ORDER — FUROSEMIDE 40 MG PO TABS: 40.0000 mg | ORAL_TABLET | Freq: Every day | ORAL | 0 refills | Status: DC

## 2018-09-30 MED ORDER — ASPIRIN 325 MG PO TBEC: 325.0000 mg | DELAYED_RELEASE_TABLET | Freq: Every day | ORAL | 0 refills | Status: DC

## 2018-09-30 MED ORDER — METOLAZONE 5 MG PO TABS
2.5000 mg | ORAL_TABLET | Freq: Once | ORAL | Status: AC
  Administered 2018-09-30: 2.5 mg via ORAL
  Filled 2018-09-30: qty 1

## 2018-09-30 MED ORDER — POTASSIUM CHLORIDE ER 20 MEQ PO TBCR: 20.0000 meq | EXTENDED_RELEASE_TABLET | Freq: Every day | ORAL | 0 refills | Status: DC

## 2018-09-30 MED ORDER — LOSARTAN POTASSIUM 50 MG PO TABS: 50.0000 mg | ORAL_TABLET | Freq: Every day | ORAL | 1 refills | Status: DC

## 2018-09-30 MED ORDER — METOPROLOL TARTRATE 25 MG PO TABS: 25.0000 mg | ORAL_TABLET | Freq: Two times a day (BID) | ORAL | 1 refills | Status: DC

## 2018-09-30 MED ORDER — FUROSEMIDE 40 MG PO TABS: 40.0000 mg | ORAL_TABLET | Freq: Every day | ORAL | Status: DC

## 2018-09-30 MED ORDER — POTASSIUM CHLORIDE CRYS ER 20 MEQ PO TBCR
20.0000 meq | EXTENDED_RELEASE_TABLET | Freq: Once | ORAL | Status: AC
  Administered 2018-09-30: 20 meq via ORAL
  Filled 2018-09-30: qty 1

## 2018-09-30 MED ORDER — LOSARTAN POTASSIUM 50 MG PO TABS
50.0000 mg | ORAL_TABLET | Freq: Every day | ORAL | Status: DC
  Administered 2018-09-30: 50 mg via ORAL
  Filled 2018-09-30: qty 1

## 2018-09-30 MED ORDER — AMIODARONE HCL 200 MG PO TABS: 200.0000 mg | ORAL_TABLET | Freq: Two times a day (BID) | ORAL | 1 refills | Status: DC

## 2018-09-30 NOTE — Progress Notes (Signed)
Removed sutures, per order from [place], [place].  Removed IV and obtained vital signs, which are stable.  Pt discharged orders placed by [place], [place].

## 2018-09-30 NOTE — Discharge Instructions (Signed)
Discharge Instructions:  1. You may shower, please wash incisions daily with soap and water and keep dry.  If you wish to cover wounds with dressing you may do so but please keep clean and change daily.  No tub baths or swimming until incisions have completely healed.  If your incisions become red or develop any drainage please call our office at [phone]  2. No Driving until cleared by Dr.Hendrickson's office and you are no longer using narcotic pain medications  3. Monitor your weight daily.. Please use the same scale and weigh at same time... If you gain 5-10 lbs in 48 hours with associated lower extremity swelling, please contact our office at [phone]  4. Fever of 101.5 for at least 24 hours with no source, please contact our office at [phone]  5. Activity- up as tolerated, please walk at least 3 times per day.  Avoid strenuous activity, no lifting, pushing, or pulling with your arms over 8-10 lbs for a minimum of 6 weeks  6. If any questions or concerns arise, please do not hesitate to contact our office at [phone]    Prediabetes Prediabetes is the condition of having a blood sugar (blood glucose) level that is higher than it should be, but not high enough for you to be diagnosed with type 2 diabetes. Having prediabetes puts you at risk for developing type 2 diabetes (type 2 diabetes mellitus). Prediabetes may be called impaired glucose tolerance or impaired fasting glucose. Prediabetes usually does not cause symptoms. Your health care provider can diagnose this condition with blood tests. You may be tested for prediabetes if you are overweight and if you have at least one other risk factor for prediabetes. Risk factors for prediabetes include:  Having a family member with type 2 diabetes.  Being overweight or obese.  Being older than age 38.  Being of American-Indian, African-American, Hispanic/Latino, or Asian/Pacific Islander descent.  Having an inactive  (sedentary) lifestyle.  Having a history of gestational diabetes or polycystic ovarian syndrome (PCOS).  Having low levels of good cholesterol (HDL-C) or high levels of blood fats (triglycerides).  Having high blood pressure.  What is blood glucose and how is blood glucose measured?  Blood glucose refers to the amount of glucose in your bloodstream. Glucose comes from eating foods that contain sugars and starches (carbohydrates) that the body breaks down into glucose. Your blood glucose level may be measured in mg/dL (milligrams per deciliter) or mmol/L (millimoles per liter).Your blood glucose may be checked with one or more of the following blood tests:  A fasting blood glucose (FBG) test. You will not be allowed to eat (you will fast) for at least 8 hours before a blood sample is taken. ? A normal range for FBG is 70-100 mg/dl (3.9-5.6 mmol/L).  An A1c (hemoglobin A1c) blood test. This test provides information about blood glucose control over the previous 2?3months.  An oral glucose tolerance test (OGTT). This test measures your blood glucose twice: ? After fasting. This is your baseline level. ? Two hours after you drink a beverage that contains glucose.  You may be diagnosed with prediabetes:  If your FBG is 100?125 mg/dL (5.6-6.9 mmol/L).  If your A1c level is 5.7?6.4%.  If your OGGT result is 140?199 mg/dL (7.8-11 mmol/L).  These blood tests may be repeated to confirm your diagnosis. What happens if blood glucose is too high? The pancreas produces a hormone (insulin) that helps move glucose from the bloodstream into cells. When cells in the body  do not respond properly to insulin that the body makes (insulin resistance), excess glucose builds up in the blood instead of going into cells. As a result, high blood glucose (hyperglycemia) can develop, which can cause many complications. This is a symptom of prediabetes. What can happen if blood glucose stays higher than normal  for a long time? Having high blood glucose for a long time is dangerous. Too much glucose in your blood can damage your nerves and blood vessels. Long-term damage can lead to complications from diabetes, which may include:  Heart disease.  Stroke.  Blindness.  Kidney disease.  Depression.  Poor circulation in the feet and legs, which could lead to surgical removal (amputation) in severe cases.  How can prediabetes be prevented from turning into type 2 diabetes?  To help prevent type 2 diabetes, take the following actions:  Be physically active. ? Do moderate-intensity physical activity for at least 30 minutes on at least 5 days of the week, or as much as told by your health care provider. This could be brisk walking, biking, or water aerobics. ? Ask your health care provider what activities are safe for you. A mix of physical activities may be best, such as walking, swimming, cycling, and strength training.  Lose weight as told by your health care provider. ? Losing 5-7% of your body weight can reverse insulin resistance. ? Your health care provider can determine how much weight loss is best for you and can help you lose weight safely.  Follow a healthy meal plan. This includes eating lean proteins, complex carbohydrates, fresh fruits and vegetables, low-fat dairy products, and healthy fats. ? Follow instructions from your health care provider about eating or drinking restrictions. ? Make an appointment to see a diet and nutrition specialist (registered dietitian) to help you create a healthy eating plan that is right for you.  Do not smoke or use any tobacco products, such as cigarettes, chewing tobacco, and e-cigarettes. If you need help quitting, ask your health care provider.  Take over-the-counter and prescription medicines as told by your health care provider. You may be prescribed medicines that help lower the risk of type 2 diabetes.  This information is not intended to  replace advice given to you by your health care provider. Make sure you discuss any questions you have with your health care provider. Document Released: 04/04/2016 Document Revised: 05/19/2016 Document Reviewed: 02/02/2016 Elsevier Interactive Patient Education  2018 [employer].  Aortic Valve Replacement, Care After Refer to this sheet in the next few weeks. These instructions provide you with information about caring for yourself after your procedure. Your health care provider may also give you more specific instructions. Your treatment has been planned according to current medical practices, but problems sometimes occur. Call your health care provider if you have any problems or questions after your procedure. What can I expect after the procedure? After the procedure, it is common to have:  Pain around your incision area.  A small amount of blood or clear fluid coming from your incision.  Follow these instructions at home: Eating and drinking   Follow instructions from your health care provider about eating or drinking restrictions. ? Limit alcohol intake to no more than 1 drink per day for nonpregnant women and 2 drinks per day for men. One drink equals 12 oz of beer, 5 oz of wine, or 1 oz of hard liquor. ? Limit how much caffeine you drink. Caffeine can affect your heart's rate and rhythm.  Drink enough fluid to keep your urine clear or pale yellow.  Eat a heart-healthy diet. This should include plenty of fresh fruits and vegetables. If you eat meat, it should be lean cuts. Avoid foods that are: ? High in salt, saturated fat, or sugar. ? Canned or highly processed. ? Fried. Activity  Return to your normal activities as told by your health care provider. Ask your health care provider what activities are safe for you.  Exercise regularly once you have recovered, as told by your health care provider.  Avoid sitting for more than 2 hours at a time without moving. Get up and  move around at least once every 1-2 hours. This helps to prevent blood clots in the legs.  Do not lift anything that is heavier than 10 lb (4.5 kg) until your health care provider approves.  Avoid pushing or pulling things with your arms until your health care provider approves. This includes pulling on handrails to help you climb stairs. Incision care   Follow instructions from your health care provider about how to take care of your incision. Make sure you: ? Wash your hands with soap and water before you change your bandage (dressing). If soap and water are not available, use hand sanitizer. ? Change your dressing as told by your health care provider. ? Leave stitches (sutures), skin glue, or adhesive strips in place. These skin closures may need to stay in place for 2 weeks or longer. If adhesive strip edges start to loosen and curl up, you may trim the loose edges. Do not remove adhesive strips completely unless your health care provider tells you to do that.  Check your incision area every day for signs of infection. Check for: ? More redness, swelling, or pain. ? More fluid or blood. ? Warmth. ? Pus or a bad smell. Medicines  Take over-the-counter and prescription medicines only as told by your health care provider.  If you were prescribed an antibiotic medicine, take it as told by your health care provider. Do not stop taking the antibiotic even if you start to feel better. Travel  Avoid airplane travel for as long as told by your health care provider.  When you travel, bring a list of your medicines and a record of your medical history with you. Carry your medicines with you. Driving  Ask your health care provider when it is safe for you to drive. Do not drive until your health care provider approves.  Do not drive or operate heavy machinery while taking prescription pain medicine. Lifestyle   Do not use any tobacco products, such as cigarettes, chewing tobacco, or  e-cigarettes. If you need help quitting, ask your health care provider.  Resume sexual activity as told by your health care provider. Do not use medicines for erectile dysfunction unless your health care provider approves, if this applies.  Work with your health care provider to keep your blood pressure and cholesterol under control, and to manage any other heart conditions that you have.  Maintain a healthy weight. General instructions  Do not take baths, swim, or use a hot tub until your health care provider approves.  Do not strain to have a bowel movement.  Avoid crossing your legs while sitting down.  Check your temperature every day for a fever. A fever may be a sign of infection.  If you are a woman and you plan to become pregnant, talk with your health care provider before you become pregnant.  Wear compression  stockings if your health care provider instructs you to do this. These stockings help to prevent blood clots and reduce swelling in your legs.  Tell all health care providers who care for you that you have an artificial (prosthetic) aortic valve. If you have or have had heart disease or endocarditis, tell all health care providers about these conditions as well.  Keep all follow-up visits as told by your health care provider. This is important. Contact a health care provider if:  You develop a skin rash.  You experience sudden, unexplained changes in your weight.  You have more redness, swelling, or pain around your incision.  You have more fluid or blood coming from your incision.  Your incision feels warm to the touch.  You have pus or a bad smell coming from your incision.  You have a fever. Get help right away if:  You develop chest pain that is different from the pain coming from your incision.  You develop shortness of breath or difficulty breathing.  You start to feel light-headed. These symptoms may represent a serious problem that is an emergency.  Do not wait to see if the symptoms will go away. Get medical help right away. Call your local emergency services (911 in the [place] Do not drive yourself to the hospital. This information is not intended to replace advice given to you by your health care provider. Make sure you discuss any questions you have with your health care provider. Document Released: 06/30/2005 Document Revised: 05/19/2016 Document Reviewed: 11/15/2015 Elsevier Interactive Patient Education  2017 [employer].

## 2018-09-30 NOTE — Progress Notes (Signed)
      [address]       [place] [address]             [phone]        6 Days Post-Op Procedure(s) (LRB): AORTIC VALVE REPLACEMENT (AVR) using  an INSPIRIS RESILIA  Prosthetic Valve (Model #: 11500A, Size: 23MM, Serial #: [id number]) (N/A) CORONARY ARTERY BYPASS GRAFTING (CABG) x 2; (LIMA to LAD, SVG to PDA) using RIGHT THIGH GREATER SAPHENOUS VEIN GRAFT (SVG) and LEFT INTERNAL MAMMARY ARTERY (LIMA): LIMA to LAD, SVG to PDA  (N/A) TRANSESOPHAGEAL ECHOCARDIOGRAM (TEE) (N/A)  Subjective: Patient states she is slowly getting better. She has no specific complaints this am.  Objective: Vital signs in last 24 hours: Temp:  [97.8 F (36.6 C)-99.3 F (37.4 C)] 97.8 F (36.6 C) (10/05 1941) Pulse Rate:  [80-89] 87 (10/05 1941) Resp:  [18-24] 24 (10/05 1941) BP: (118-145)/(63-74) 145/66 (10/05 1941) SpO2:  [93 %-98 %] 93 % (10/05 1941)  Pre op weight 100.6 kg Current Weight  09/29/18 104.2 kg       Intake/Output from previous day: 10/05 0701 - 10/06 0700 In: 600 [P.O.:600] Out: 850 [Urine:850]   Physical Exam:  Cardiovascular: RRR Pulmonary: Diminished at bases Abdomen: Soft, non tender, obese, bowel sounds present. Extremities: Mild bilateral lower extremity edema. Wounds: Clean and dry.  No erythema or signs of infection.  Lab Results: CBC: Recent Labs    09/28/18 0735 09/30/18 0344  WBC 10.3 10.3  HGB 7.7* 7.8*  HCT 25.4* 26.3*  PLT 224 312   BMET:  Recent Labs    09/28/18 0735 09/30/18 0344  NA 139 140  K 3.8 3.8  CL 106 103  CO2 26 28  GLUCOSE 123* 115*  BUN 13 13  CREATININE 0.72 0.77  CALCIUM 8.8* 9.0    PT/INR:  Lab Results  Component Value Date   INR 1.46 09/24/2018   INR 0.95 09/21/2018   INR 0.93 07/17/2017   ABG:  INR: Will add last result for INR, ABG once components are confirmed Will add last 4 CBG results once components are confirmed  Assessment/Plan: 1. CV-A fib intra op. Had mild tachycardia and PACs  previously. SR this am. On Lopressor 25 mg bid and Amiodarone 400 mg bid, and Losartan 25 mg daily. Will increase Losartan for better BP control. 2. Pulmonary-On room air. Encourage incentive spirometer and flutter valve. 3. Volume overload-will give a dose of Zaroxolyn followed by oral Lasix today. Will need Lasix at discharge 4. ABL anemia- H and H this am stable at 7.8 and 26.3. Continue folic acid and oral ferrous. 5. Hypothyroidism-on Levothyroxine 125 mcg daily 6. Supplement potassium 7. Discharge home  [place] 09/30/2018,7:57 AM [phone]

## 2018-10-01 ENCOUNTER — Telehealth (HOSPITAL_COMMUNITY): Payer: Self-pay

## 2018-10-01 LAB — GLUCOSE, CAPILLARY: Glucose-Capillary: 100 mg/dL — ABNORMAL HIGH (ref 70–99)

## 2018-10-01 NOTE — Telephone Encounter (Signed)
Made initial call with patient in regards to Cardiac Rehab - Patient was asking a lot of great questions about the program. Patient has a better understanding on what this program is about and is interested. I explained to patient our scheduling process, went over insurance, and patient verbalized understanding. Patient mentioned she would only be able to do two days a week, Mondays and Wednesdays due to transportation. Someone will be bringing her to each session. Will contact patient after f/u appt.

## 2018-10-01 NOTE — Telephone Encounter (Signed)
Patients insurance is active and benefits verified through [employer] - $15.00 co-pay, no deductible, out of pocket amount of $3,400/$388.99 has been met, no co-insurance, and no pre-authorization is required. Spoke with Cecille Rubin from [employer] [phone]  Will make initial call to patient in regards to Cardiac Rehab. If interested, patient will need to complete follow up appt. Once completed, patient will be contacted for scheduling.

## 2018-10-02 ENCOUNTER — Ambulatory Visit: Payer: PPO | Admitting: Cardiovascular Disease

## 2018-10-04 ENCOUNTER — Telehealth: Payer: Self-pay

## 2018-10-04 NOTE — Telephone Encounter (Signed)
Ms. Haltiwanger contacted the office with many questions regarding her recent surgery with Dr. Roxan Hockey.  She had CABG/ AVR on 09/24/18 and was discharged from the hospital.  She stated that she feels fatigued a lot and has chest soreness.  I advised her that this is normal being she just had surgery a little over a week ago.  She also stated she was using her incentive spirometer and walking around her house.  She stated that she really was not using her upper body at all even to do ADL's like turning on the light switch and combing her hair.  I advised her that she was to not lift, push, or pull but ADL's were ok.  Advised this may be the result of the increased soreness because she is not using her arms/ hands/ upper body at all.  She is currently sponge bathing and not showering due to shower being upstairs.  I advised her that all her symptoms are normal part of the healing process and surgery and assured her it seemed normal.  She stated that she did not have a temperature and her incisions looked to be healing nicely.  I went over her next appointments with Cardiologist and [facility] which she acknowledged receipt.  She was very thankful for the call.

## 2018-10-08 ENCOUNTER — Telehealth: Payer: Self-pay

## 2018-10-08 NOTE — Telephone Encounter (Signed)
Patient contacted the office again with questions about being tired and wondered if this was normal.  I advised her that she is just 2 weeks out of having surgery AVR/ CABg with Dr. Roxan Hockey and this was a normal process.  She also stated that she slept in the bed for the first time and when she woke up her neck and shoulder was sore.  I advised her to sleep in the position that makes her most comfortable.  In order to reduce the soreness I did advised using hot/ cold compresses to the site.  She also asked if over the counter creams/ patches were ok.  I advised she could take them.  She stated she is now taking a shower.  Her daughter then got on the phone and stated that she has one more potassium pill than her lasix which both were prescribed at discharge for 10 days.  I advised to take the potassium with the lasix and not to take the "extra pill" she had.  She acknowledged receipt and thanked me for the call.

## 2018-10-11 ENCOUNTER — Encounter: Payer: Self-pay | Admitting: Physician Assistant

## 2018-10-11 ENCOUNTER — Ambulatory Visit: Payer: PPO | Admitting: Physician Assistant

## 2018-10-11 VITALS — BP 110/80 | Ht 65.5 in | Wt 219.4 lb

## 2018-10-11 DIAGNOSIS — Z952 Presence of prosthetic heart valve: Secondary | ICD-10-CM

## 2018-10-11 DIAGNOSIS — I10 Essential (primary) hypertension: Secondary | ICD-10-CM

## 2018-10-11 DIAGNOSIS — Z951 Presence of aortocoronary bypass graft: Secondary | ICD-10-CM

## 2018-10-11 DIAGNOSIS — E785 Hyperlipidemia, unspecified: Secondary | ICD-10-CM | POA: Diagnosis not present

## 2018-10-11 MED ORDER — METOPROLOL TARTRATE 25 MG PO TABS: 25.0000 mg | ORAL_TABLET | Freq: Two times a day (BID) | ORAL | 1 refills | Status: DC

## 2018-10-11 MED ORDER — LOSARTAN POTASSIUM 50 MG PO TABS: 50.0000 mg | ORAL_TABLET | Freq: Every day | ORAL | 1 refills | Status: DC

## 2018-10-11 MED ORDER — ALLOPURINOL 100 MG PO TABS: 100.0000 mg | ORAL_TABLET | Freq: Every day | ORAL | 0 refills | Status: AC

## 2018-10-11 NOTE — Progress Notes (Signed)
Cardiology Office Note   Date:  10/11/2018   ID:  MERL GUARDINO, DOB 01-31-1946, MRN [id number]  PCP:  Jamesetta Orleans, PA-C Cardiologist:  Quay Burow, MD 09/04/2018 Rosaria Ferries, PA-C   No chief complaint on file.   History of Present Illness: CHARMANE PROTZMAN is a 72 y.o. female with a history of severe AS, HTN, HLD (intol statin), GERD, OA, gout, TIA, FH CAD   9/10 office visit, patient wished to discuss TAVR.   Cardiac catheterization 9/16 with severe two-vessel disease Admitted 09/30-10/05/2018 for CABG and AVR, s/p 23 mm Edwards Inspiris Resilia pericardial valve (model number 11500A, serial number [id number]) and LIMA-LAD, SVG-PDA, intraoperative A. fib noted  [name] presents for cardiology follow up.  She initially struggled getting in/out of the bed, that is better. She is having problems w/ restless legs, sleeps part of the time in a recliner.  However, she says that she is not sleeping in the recliner to help her breathing.  Her breathing is improving. Her daughter parked far away and she was able to walk the whole distance without sitting down, just had to stop and rest once or twice.  That is most walking she has done since she went home from the hospital.  She is walking more in the house, doing better.   She was surprised to find how tired and weak she was when she went home. She has an upstairs bedroom, is able to sleep upstairs.  She climbs the stairs once a day  She had L neck and shoulder pain when in the hospital.  That has continued since discharge.  She is using a heating pad which helps.   She has not had LE edema, no PND, no orthopnea.  She took the Lasix and potassium as prescribed, has 1 tablet left, but does not think she needs it.  No palpitations, no feeling that her heart is out of rhythm.     Past Medical History:  Diagnosis Date  . Aortic stenosis, severe 08/2018   progressed from moderate by ECHO 01-03-17   . Arthritis   . Cancer  ([place])    basal cell skin cancer  . Carotid artery bruit   . Claustrophobia   . Coronary artery disease   . Diverticulosis   . Family history of heart disease   . GERD (gastroesophageal reflux disease)   . Gout   . History of hiatal hernia   . History of kidney stones   . Hyperlipemia   . Hypertension   . Irritable bowel syndrome   . Thyroid disease   . UTI (urinary tract infection) 03/09/14    Past Surgical History:  Procedure Laterality Date  . AORTIC VALVE REPLACEMENT N/A 09/24/2018   Procedure: AORTIC VALVE REPLACEMENT (AVR) using  an INSPIRIS RESILIA  Prosthetic Valve (Model #: 11500A, Size: 23MM, Serial #: [id number]);  Surgeon: Melrose Nakayama, MD;  Location: [facility];  Service: Open Heart Surgery;  Laterality: N/A;  . APPENDECTOMY     taken out with hysterectomy  . BLADDER SURGERY     Lift   . COLONOSCOPY    . CORONARY ARTERY BYPASS GRAFT N/A 09/24/2018   Procedure: CORONARY ARTERY BYPASS GRAFTING (CABG) x 2; (LIMA to LAD, SVG to PDA) using RIGHT THIGH GREATER SAPHENOUS VEIN GRAFT (SVG) and LEFT INTERNAL MAMMARY ARTERY (LIMA): LIMA to LAD, SVG to PDA ;  Surgeon: Melrose Nakayama, MD;  Location: [facility];  Service: Open Heart Surgery;  Laterality:  N/A;  . EYE SURGERY Bilateral    cataract surgery with lens implants  . lithotripsy   within last 10 years  . OOPHORECTOMY    . PARTIAL HYSTERECTOMY    . RIGHT/LEFT HEART CATH AND CORONARY ANGIOGRAPHY N/A 09/10/2018   Procedure: RIGHT/LEFT HEART CATH AND CORONARY ANGIOGRAPHY;  Surgeon: Lorretta Harp, MD;  Location: [facility];  Service: Cardiovascular;  Laterality: N/A;  . TEE WITHOUT CARDIOVERSION N/A 09/24/2018   Procedure: TRANSESOPHAGEAL ECHOCARDIOGRAM (TEE);  Surgeon: Melrose Nakayama, MD;  Location: [facility];  Service: Open Heart Surgery;  Laterality: N/A;  . TOTAL KNEE ARTHROPLASTY Right 07/24/2017   Procedure: RIGHT TOTAL KNEE ARTHROPLASTY;  Surgeon: Gaynelle Arabian, MD;  Location: [facility];  Service: Orthopedics;   Laterality: Right;  Adductor Block    Current Outpatient Medications  Medication Sig Dispense Refill  . acetaminophen (TYLENOL) 650 MG CR tablet Take 1,300 mg by mouth every 8 (eight) hours as needed for pain.    Marland Kitchen allopurinol (ZYLOPRIM) 100 MG tablet Take 1 tablet (100 mg total) by mouth daily. 30 tablet 0  . amiodarone (PACERONE) 200 MG tablet Take 1 tablet (200 mg total) by mouth 2 (two) times daily. For one week then take 200 mg by mouth daily thereafter 30 tablet 1  . aspirin EC 325 MG EC tablet Take 1 tablet (325 mg total) by mouth daily. 30 tablet 0  . atorvastatin (LIPITOR) 10 MG tablet Take 1 tablet (10 mg total) by mouth daily at 6 PM. 30 tablet 1  . Cholecalciferol (VITAMIN D) 2000 units tablet Take 1 tablet by mouth daily.    . ferrous sulfate 325 (65 FE) MG tablet Take 1 tablet (325 mg total) by mouth daily with breakfast. For one month then stop. 30 tablet 0  . levothyroxine (SYNTHROID, LEVOTHROID) 125 MCG tablet Take 125 mcg by mouth daily before breakfast.    . Liniments (SALONPAS PAIN RELIEF PATCH EX) Apply 1 patch topically daily as needed (pain).    Marland Kitchen losartan (COZAAR) 50 MG tablet Take 1 tablet (50 mg total) by mouth daily. 90 tablet 1  . metoprolol tartrate (LOPRESSOR) 25 MG tablet Take 1 tablet (25 mg total) by mouth 2 (two) times daily. 90 tablet 1  . Propylene Glycol (SYSTANE BALANCE) 0.6 % SOLN Place 1 drop into both eyes 2 (two) times daily as needed (dry eyes).    . traMADol (ULTRAM) 50 MG tablet Take 50 mg by mouth every 4-6 hours PRN severe pain 30 tablet 0  . trimethoprim (TRIMPEX) 100 MG tablet Take 100 mg by mouth daily as needed (For UTI Prevention).     Marland Kitchen dicyclomine (BENTYL) 10 MG capsule Take 10 mg by mouth 4 (four) times daily as needed for spasms.     . furosemide (LASIX) 40 MG tablet Take 1 tablet (40 mg total) by mouth daily. For 10 days then stop. (Patient not taking: Reported on 10/11/2018) 10 tablet 0  . lansoprazole (PREVACID) 30 MG capsule Take 30 mg  by mouth daily as needed (heartburn).     . meclizine (ANTIVERT) 25 MG tablet Take 25 mg by mouth 3 (three) times daily as needed for dizziness.     . potassium chloride 20 MEQ TBCR Take 20 mEq by mouth daily. For 10 days then stop. (Patient not taking: Reported on 10/11/2018) 10 tablet 0   No current facility-administered medications for this visit.     Allergies:   Amoxicillin; Amoxicillin-pot clavulanate; Ampicillin; Clarithromycin; Clopidogrel bisulfate; Nitrofurantoin; Plavix [  clopidogrel bisulfate]; and Zestril [lisinopril]    Social History:  The patient  reports that she quit smoking about 31 years ago. Her smoking use included cigarettes. She has a 16.00 pack-year smoking history. She has never used smokeless tobacco. She reports that she does not drink alcohol or use drugs.   Family History:  The patient's family history includes Heart disease in her father and mother.  She indicated that her mother is deceased. She indicated that her father is deceased. She indicated that the status of her neg hx is unknown.   ROS:  Please see the history of present illness. All other systems are reviewed and negative.    PHYSICAL EXAM: VS:  BP 110/80   Ht 5' 5.5" (1.664 m)   Wt 219 lb 6.4 oz (99.5 kg)   BMI 35.95 kg/m  , BMI Body mass index is 35.95 kg/m. GEN: Well nourished, well developed, female in no acute distress HEENT: normal for age  Neck: no JVD, no carotid bruit, no masses Cardiac: RRR; soft murmur, no rubs, or gallops Respiratory: Slightly decreased breath sounds bases bilaterally, normal work of breathing GI: soft, nontender, nondistended, + BS MS: no deformity or atrophy; no edema; distal pulses are 2+ in all 4 extremities  Skin: warm and dry, no rash; surgical scar is healing well with no signs of infection Neuro:  Strength and sensation are intact Psych: euthymic mood, full affect   EKG:  EKG is ordered today. The ekg ordered today demonstrates sinus rhythm, heart  rate 66, morphology changes noted from 09/25/2018 with loss of some precordial R waves and decreased precordial amplitude, but no acute ischemic changes  Intraoperative TEE: 09/24/2018  Septum: No Patent Foramen Ovale present.  Left atrium: Patent foramen ovale not present.  Aortic valve: The valve is trileaflet. Moderate valve thickening present. Severe valve calcification present. Severely decreased leaflet separation. Severe stenosis. Trace regurgitation.  Mitral valve: Moderate leaflet thickening is present. Mild regurgitation.  Right ventricle: Normal cavity size and ejection fraction.  Tricuspid valve: Trace regurgitation.  Pulmonic valve: Trace regurgitation.  Right ventricle: Normal ejection fraction. Cavity is mildly dilated.   ECHO: 01/30/2018 - Left ventricle: The cavity size was normal. There was mild   concentric hypertrophy. Systolic function was vigorous. The   estimated ejection fraction was in the range of 65% to 70%. Wall   motion was normal; there were no regional wall motion   abnormalities. Doppler parameters are consistent with abnormal   left ventricular relaxation (grade 1 diastolic dysfunction).   Doppler parameters are consistent with elevated mean left atrial   filling pressure. - Aortic valve: Right coronary and left coronary cusp mobility was   severely restricted. There was moderate to severe stenosis. - Mitral valve: There was mild regurgitation.  Impressions:  - Compared to January 2018, aortic stenosis has worsened and may be   severe, but the jet remains early peaking (suggesting   non-critical stenosis). There are now signs of elevated mean left   atrial pressure.   CATH: 09/10/2018  prox LAD lesion is 95% stenosed.  Mid RCA lesion is 100% stenosed.  Hemodynamic findings consistent with aortic valve stenosis.  Recent Labs: 09/05/2018: TSH 0.474 09/21/2018: ALT 23 09/25/2018: Magnesium 2.4 09/30/2018: BUN 13; Creatinine, Ser 0.77;  Hemoglobin 7.8; Platelets 312; Potassium 3.8; Sodium 140  CBC    Component Value Date/Time   WBC 10.3 09/30/2018 0344   RBC 2.83 (L) 09/30/2018 0344   HGB 7.8 (L) 09/30/2018 0344   HGB  13.8 09/05/2018 0940   HCT 26.3 (L) 09/30/2018 0344   HCT 40.3 09/05/2018 0940   PLT 312 09/30/2018 0344   PLT 241 09/05/2018 0940   MCV 92.9 09/30/2018 0344   MCV 84 09/05/2018 0940   MCH 27.6 09/30/2018 0344   MCHC 29.7 (L) 09/30/2018 0344   RDW 15.6 (H) 09/30/2018 0344   RDW 14.9 09/05/2018 0940   LYMPHSABS 2.3 09/05/2018 0940   MONOABS 0.7 03/13/2014 1059   EOSABS 0.1 09/05/2018 0940   BASOSABS 0.0 09/05/2018 0940   CMP Latest Ref Rng & Units 09/30/2018 09/28/2018 09/27/2018  Glucose 70 - 99 mg/dL 115(H) 123(H) 101(H)  BUN 8 - 23 mg/dL 13 13 12   Creatinine 0.44 - 1.00 mg/dL 0.77 0.72 0.60  Sodium 135 - 145 mmol/L 140 139 137  Potassium 3.5 - 5.1 mmol/L 3.8 3.8 3.9  Chloride 98 - 111 mmol/L 103 106 105  CO2 22 - 32 mmol/L 28 26 25   Calcium 8.9 - 10.3 mg/dL 9.0 8.8(L) 8.4(L)  Total Protein 6.5 - 8.1 g/dL - - -  Total Bilirubin 0.3 - 1.2 mg/dL - - -  Alkaline Phos 38 - 126 U/L - - -  AST 15 - 41 U/L - - -  ALT 0 - 44 U/L - - -     Lipid Panel    Component Value Date/Time   CHOL 229 (H) 09/05/2018 0940   TRIG 284 (H) 09/05/2018 0940   HDL 41 09/05/2018 0940   CHOLHDL 5.6 (H) 09/05/2018 0940   LDLCALC 131 (H) 09/05/2018 0940     Wt Readings from Last 3 Encounters:  10/11/18 219 lb 6.4 oz (99.5 kg)  09/30/18 226 lb 6.6 oz (102.7 kg)  09/21/18 222 lb (100.7 kg)     Other studies Reviewed: Additional studies/ records that were reviewed today include: Office notes, hospital records and testing.  ASSESSMENT AND PLAN:  1.  CAD status post CABG x2: She is on full-strength aspirin, ARB and beta-blocker. -She completed the postoperative Lasix/potassium and has no significant volume overload by exam - She is currently on amiodarone because of intraoperative atrial fibrillation -  Continue this until she sees the surgical team, no symptoms  2.  AVR, bioprosthetic valve: Her murmur is not loud, she is increasing her activity level successfully.  She is to continue this.  We will refer to cardiac rehab.  3.  Hypertension: Her meds were changed during the hospitalization.  She is on metoprolol 25 mg twice daily and losartan 50 mg a day.  Continue these.  4.  Hyperlipidemia, goal LDL less than 70: Her most recent lipid profile as above, she has not tolerated statins well.  She is currently on a low dose of Lipitor.  Refer to lipid clinic for consideration of PCSK9 inhibitors   Current medicines are reviewed at length with the patient today.  The patient has concerns regarding medicines.  Concerns were addressed.  We will give her 90-day prescriptions for cardiac medications, but will leave amiodarone to Dr. Roxan Hockey  The following changes have been made: None  Labs/ tests ordered today include:   Orders Placed This Encounter  Procedures  . AMB referral to cardiac rehabilitation  . EKG 12-Lead     Disposition:   FU with Quay Burow, MD  Signed, Rosaria Ferries, PA-C  10/11/2018 1:58 PM    [facility] Phone: [phone]; Fax: [phone]  This note was written with the assistance of speech recognition software.  Please  excuse any transcriptional errors.

## 2018-10-11 NOTE — Patient Instructions (Signed)
Medication Instructions:  No Changes, refills sent to pharmacy for 90 day supply If you need a refill on your cardiac medications before your next appointment, please call your pharmacy.   Lab work: None Ordered. If you have labs (blood work) drawn today and your tests are completely normal, you will receive your results only by: Marland Kitchen MyChart Message (if you have MyChart) OR . A paper copy in the mail If you have any lab test that is abnormal or we need to change your treatment, we will call you to review the results.  Testing/Procedures: None Ordered.  Follow-Up: At [facility], you and your health needs are our priority.  As part of our continuing mission to provide you with exceptional heart care, we have created designated Provider Care Teams.  These Care Teams include your primary Cardiologist (physician) and Advanced Practice Providers (APPs -  Physician Assistants and Nurse Practitioners) who all work together to provide you with the care you need, when you need it. You will need a follow up appointment in 3 months.  Please call our office 2 months in advance to schedule this appointment.  You may see Quay Burow, MD or one of the following Advanced Practice Providers on your designated Care Team:   Kerin Ransom, [place] Rosaria Ferries, PA-C  Any Other Special Instructions Will Be Listed Below (If Applicable). You have been referred to [facility] with Pharmacist, and Cardiac Rehab.

## 2018-10-15 ENCOUNTER — Other Ambulatory Visit: Payer: Self-pay

## 2018-10-15 ENCOUNTER — Telehealth: Payer: Self-pay | Admitting: Cardiovascular Disease

## 2018-10-15 MED ORDER — POTASSIUM CHLORIDE ER 10 MEQ PO TBCR: 10.0000 meq | EXTENDED_RELEASE_TABLET | Freq: Every day | ORAL | 1 refills | Status: DC | PRN

## 2018-10-15 MED ORDER — POTASSIUM CHLORIDE CRYS ER 20 MEQ PO TBCR: 20.0000 meq | EXTENDED_RELEASE_TABLET | Freq: Every day | ORAL | 0 refills | Status: DC | PRN

## 2018-10-15 MED ORDER — FUROSEMIDE 20 MG PO TABS: 20.0000 mg | ORAL_TABLET | Freq: Every day | ORAL | 0 refills | Status: DC | PRN

## 2018-10-15 NOTE — Telephone Encounter (Signed)
Orders have been placed. Patient educated.

## 2018-10-15 NOTE — Telephone Encounter (Signed)
Patient made aware of Rhonda's recommendations and voiced understanding.

## 2018-10-15 NOTE — Telephone Encounter (Signed)
Per the patient she has not gained any weight but she is experiencing swelling to the ankles. Patient has finished her 10 day course of lasix post CABG. In the mean time patient was advised to have someone assist her with putting on compression stockings and to elevate the legs. The patient also understands that she should call CTS office regarding her wound. Please advise regarding swelling. Patient of [name] Found, MD and Barrett, PA.

## 2018-10-15 NOTE — Telephone Encounter (Signed)
Please have her take Lasix 20 mg with Kdur 10 meq qd prn if she wakes w/ LE edema, or has weight gain of 3 lbs in a day or 5 lbs in a week. Thanks

## 2018-10-15 NOTE — Telephone Encounter (Signed)
New message    Hayley from [place] is calling about pt medication.  Pt c/o medication issue:  1. Name of Medication: Potassium  2. How are you currently taking this medication (dosage and times per day)?   3. Are you having a reaction (difficulty breathing--STAT)?   4. What is your medication issue? Pharmacy needs clarification on orders. She states there was a note on the order but it was not clear.

## 2018-10-15 NOTE — Telephone Encounter (Signed)
Clarification has been given.

## 2018-10-15 NOTE — Telephone Encounter (Signed)
New message    Pt is calling. She said that she just had open heart surgery and the strips came off and it doesn't look right. She said there is a hole there.   Pt c/o swelling: STAT is pt has developed SOB within 24 hours  1) How much weight have you gained and in what time span? No   2) If swelling, where is the swelling located? ankles  3) Are you currently taking a fluid pill? No, pt was taken off last week  4) Are you currently SOB? No   5) Do you have a log of your daily weights (if so, list)? no  6) Have you gained 3 pounds in a day or 5 pounds in a week? no  7) Have you traveled recently? no

## 2018-10-16 ENCOUNTER — Telehealth (HOSPITAL_COMMUNITY): Payer: Self-pay

## 2018-10-16 NOTE — Telephone Encounter (Signed)
Called patient in regards to CR, pt states right now was not a good time and would like [place] to give her a call back later.

## 2018-10-23 ENCOUNTER — Ambulatory Visit: Payer: PPO

## 2018-10-24 ENCOUNTER — Encounter (HOSPITAL_COMMUNITY): Payer: Self-pay

## 2018-10-24 NOTE — Telephone Encounter (Signed)
Attempted to call pt a 2nd time- LM ON VM ° °Mailed letter out °

## 2018-10-29 ENCOUNTER — Other Ambulatory Visit: Payer: Self-pay | Admitting: Thoracic Surgery (Cardiothoracic Vascular Surgery)

## 2018-10-29 DIAGNOSIS — Z951 Presence of aortocoronary bypass graft: Secondary | ICD-10-CM

## 2018-10-30 ENCOUNTER — Other Ambulatory Visit: Payer: Self-pay

## 2018-10-30 ENCOUNTER — Ambulatory Visit (INDEPENDENT_AMBULATORY_CARE_PROVIDER_SITE_OTHER): Payer: PPO | Admitting: Thoracic Surgery (Cardiothoracic Vascular Surgery)

## 2018-10-30 ENCOUNTER — Encounter: Payer: Self-pay | Admitting: Thoracic Surgery (Cardiothoracic Vascular Surgery)

## 2018-10-30 ENCOUNTER — Ambulatory Visit
Admission: RE | Admit: 2018-10-30 | Discharge: 2018-10-30 | Disposition: A | Discharge status: Home / Self Care | Payer: PPO | Source: Ambulatory Visit | Attending: Thoracic Surgery (Cardiothoracic Vascular Surgery) | Admitting: Thoracic Surgery (Cardiothoracic Vascular Surgery)

## 2018-10-30 VITALS — BP 140/100 | HR 75 | Resp 18 | Ht 65.0 in | Wt 218.2 lb

## 2018-10-30 DIAGNOSIS — I350 Nonrheumatic aortic (valve) stenosis: Secondary | ICD-10-CM

## 2018-10-30 DIAGNOSIS — Z951 Presence of aortocoronary bypass graft: Secondary | ICD-10-CM

## 2018-10-30 DIAGNOSIS — Z952 Presence of prosthetic heart valve: Secondary | ICD-10-CM

## 2018-10-30 DIAGNOSIS — R0602 Shortness of breath: Secondary | ICD-10-CM | POA: Diagnosis not present

## 2018-10-30 DIAGNOSIS — I2510 Atherosclerotic heart disease of native coronary artery without angina pectoris: Secondary | ICD-10-CM

## 2018-10-30 NOTE — Patient Instructions (Addendum)
Stop amiodarone after 11/04/2018

## 2018-10-30 NOTE — Progress Notes (Signed)
[address]       [place] [address]             [phone]       HPI: [name] [name] returns for scheduled postoperative follow-up visit  [name] [name] is a 72 year old woman with a past medical history significant for aortic stenosis, hypertension, hyperlipidemia, obesity, reflux, arthritis, gout, and remote TIA.  She was followed for a couple of years with aortic stenosis.  Earlier in 2019 she was found to have progression of the aortic stenosis with a peak gradient of 70 and a valve area of 1.17 cm.  She presented with worsening shortness of breath with exertion. At catheterization she was found to have a total occlusion of her right coronary and a 95% proximal LAD stenosis.  She underwent aortic valve replacement with a bovine pericardial valve and coronary bypass grafting x2 on 09/24/2018.  He had some atrial fibrillation intraoperatively and was started on amiodarone.  She did well postoperatively and went home on day 6.  Since discharge she is been doing well.  She has not had any recurrent anginal pain.  She notices brief episodes where she has to catch her breath during the course of the day.  She has some soreness for which she is using 1 tramadol tablet a day.  She has questions about her aspirin dose. Past Medical History:  Diagnosis Date  . Aortic stenosis, severe 08/2018   progressed from moderate by ECHO 01-03-17   . Arthritis   . Cancer ([place])    basal cell skin cancer  . Carotid artery bruit   . Claustrophobia   . Coronary artery disease   . Diverticulosis   . Family history of heart disease   . GERD (gastroesophageal reflux disease)   . Gout   . History of hiatal hernia   . History of kidney stones   . Hyperlipemia   . Hypertension   . Irritable bowel syndrome   . Thyroid disease   . UTI (urinary tract infection) 03/09/14    Current Outpatient Medications  Medication Sig Dispense Refill  . acetaminophen (TYLENOL) 650 MG CR tablet Take 1,300  mg by mouth every 8 (eight) hours as needed for pain.    Marland Kitchen allopurinol (ZYLOPRIM) 100 MG tablet Take 1 tablet (100 mg total) by mouth daily. 30 tablet 0  . amiodarone (PACERONE) 200 MG tablet Take 1 tablet (200 mg total) by mouth 2 (two) times daily. For one week then take 200 mg by mouth daily thereafter 30 tablet 1  . aspirin EC 325 MG EC tablet Take 1 tablet (325 mg total) by mouth daily. 30 tablet 0  . atorvastatin (LIPITOR) 10 MG tablet Take 1 tablet (10 mg total) by mouth daily at 6 PM. 30 tablet 1  . Cholecalciferol (VITAMIN D) 2000 units tablet Take 1 tablet by mouth daily.    Marland Kitchen dicyclomine (BENTYL) 10 MG capsule Take 10 mg by mouth 4 (four) times daily as needed for spasms.     . ferrous sulfate 325 (65 FE) MG tablet Take 1 tablet (325 mg total) by mouth daily with breakfast. For one month then stop. 30 tablet 0  . furosemide (LASIX) 20 MG tablet Take 1 tablet (20 mg total) by mouth daily as needed for fluid or edema (weight gain of 3 lbs in 1 day or >5 lbs in 1 week). 90 tablet 0  . lansoprazole (PREVACID) 30 MG capsule Take 30 mg by mouth  daily as needed (heartburn).     Marland Kitchen levothyroxine (SYNTHROID, LEVOTHROID) 125 MCG tablet Take 125 mcg by mouth daily before breakfast.    . Liniments (SALONPAS PAIN RELIEF PATCH EX) Apply 1 patch topically daily as needed (pain).    Marland Kitchen losartan (COZAAR) 50 MG tablet Take 1 tablet (50 mg total) by mouth daily. 90 tablet 1  . meclizine (ANTIVERT) 25 MG tablet Take 25 mg by mouth 3 (three) times daily as needed for dizziness.     . metoprolol tartrate (LOPRESSOR) 25 MG tablet Take 1 tablet (25 mg total) by mouth 2 (two) times daily. 90 tablet 1  . potassium chloride (K-DUR) 10 MEQ tablet Take 1 tablet (10 mEq total) by mouth daily as needed. 30 tablet 1  . Propylene Glycol (SYSTANE BALANCE) 0.6 % SOLN Place 1 drop into both eyes 2 (two) times daily as needed (dry eyes).    . traMADol (ULTRAM) 50 MG tablet Take 50 mg by mouth every 4-6 hours PRN severe pain 30  tablet 0  . trimethoprim (TRIMPEX) 100 MG tablet Take 100 mg by mouth daily as needed (For UTI Prevention).      No current facility-administered medications for this visit.     Physical Exam BP (!) 140/100 (BP Location: Right Arm, Patient Position: Sitting, Cuff Size: Normal)   Pulse 75   Resp 18   Ht 5\' 5"  (1.651 m)   Wt 218 lb 3.2 oz (99 kg)   SpO2 96% Comment: RA  BMI 36.31 kg/m  72 year old woman in no acute distress Alert and oriented x3 with no focal deficits Lungs clear with equal breath sounds bilaterally Cardiac regular rate and rhythm normal S1 and prominent S2 Sternum stable, incision healing well Leg incision healing well, varicosities, no significant edema.  Diagnostic Tests: CHEST - 2 VIEW  COMPARISON:  September 28, 2018  FINDINGS: The mediastinal contour is normal. There is evidence of prior CABG. Cardiac valvular replacement ring is noted. Heart size is normal. Mild atelectasis or scar is identified in the left lung base. There is no focal infiltrate, pulmonary edema, or pleural effusion. The visualized skeletal structures are stable.  IMPRESSION: No active cardiopulmonary disease. Mild scar or atelectasis of left lung base.   Electronically Signed   By: Abelardo Diesel M.D.   On: 10/30/2018 15:20 Personally reviewed the chest x-ray images and concur with the findings noted above  Impression: [name] [name] is a 72 year old woman who presented with progressive shortness of breath.  She had severe two-vessel coronary disease with a 95% LAD stenosis and a total occlusion of her right coronary in addition to severe aortic stenosis.  She underwent aortic valve replacement and coronary artery bypass grafting x2 on 09/24/2018.  She did well postoperatively and went home on day 6.  Aortic stenosis-status post AVR  Coronary artery disease-status post CABG x2.  She has been on aspirin 325 mg daily.  She would like to drop that down to 81 mg daily as she does have  a history of reflux and has had some stomach upset.  I told her it is fine to do that.  She still has some incisional discomfort.  She is using tramadol once a day.  She will gradually taper that off.  Atrial fibrillation-postoperative-she is in sinus rhythm today.  She will continue her amiodarone through November 10 and then stop it at that time.  She may begin driving.  Appropriate precautions were discussed.  She should not lift anything over 10 pounds  for another week and nothing over 20 pounds for another 3 weeks.  Plan: Follow-up with Dr. Gwenlyn Found.  I will be happy to see [name] [name] back anytime the future if I can be of any further assistance with her care  Melrose Nakayama, MD [facility] [phone]

## 2018-11-01 ENCOUNTER — Telehealth (HOSPITAL_COMMUNITY): Payer: Self-pay

## 2018-11-01 NOTE — Telephone Encounter (Signed)
3rd attempt to contact patient in regards to CR - lm on vm °

## 2018-11-15 ENCOUNTER — Other Ambulatory Visit: Payer: Self-pay | Admitting: Cardiovascular Disease

## 2018-11-15 MED ORDER — ATORVASTATIN CALCIUM 10 MG PO TABS: 10.0000 mg | ORAL_TABLET | Freq: Every day | ORAL | 1 refills | Status: DC

## 2018-11-15 MED ORDER — LOSARTAN POTASSIUM 50 MG PO TABS: 50.0000 mg | ORAL_TABLET | Freq: Every day | ORAL | 1 refills | Status: DC

## 2018-11-15 MED ORDER — METOPROLOL TARTRATE 25 MG PO TABS: 25.0000 mg | ORAL_TABLET | Freq: Two times a day (BID) | ORAL | 1 refills | Status: DC

## 2018-11-15 NOTE — Telephone Encounter (Signed)
°*  STAT* If patient is at the pharmacy, call can be transferred to refill team.   1. Which medications need to be refilled? (please list name of each medication and dose if known) Atorvastatin, Metoprolol and Losartan  2. Which pharmacy/location (including street and city if local pharmacy) is medication to be sent to? [facility] [phone]  3. Do they need a 30 day or 90 day supply? Atorvastatin #90-Metoprolol 180 and Losartan 90 and refills

## 2018-12-06 ENCOUNTER — Telehealth (HOSPITAL_COMMUNITY): Payer: Self-pay

## 2018-12-06 ENCOUNTER — Telehealth: Payer: Self-pay | Admitting: Cardiovascular Disease

## 2018-12-06 NOTE — Telephone Encounter (Signed)
Pt insurance is active and benefits verified through [employer]. Co-pay $15.00, DED $0.00/$0.00 met, out of pocket $3,400.00/$2,571.17 met, co-insurance 0%. No pre-authorization required. Tamarri/[employer], 12/06/18 @ 11:59AM, REF# [id number]

## 2018-12-06 NOTE — Telephone Encounter (Signed)
Pt called and stated she now ready to participate in CR. Patient will come in for orientation on 01/22/2019 @ 815AM and will attend the 11:15AM exercise class.  Mailed homework package.

## 2018-12-06 NOTE — Telephone Encounter (Signed)
New message    Pt is calling asking for a call back. She wants to know when after surgery will she be able to go to the dentist. Pt said she wants to know if it's normal to have sob after surgery.

## 2018-12-06 NOTE — Telephone Encounter (Signed)
Returned call to patient she stated she wanted to ask Dr.Berry if ok to have dental cleaning and does she need antibiotics before any dental procedure.She also stated she is ready to start cardiac rehab.Advised I will send message to [facility] for advice.I will let cardiac rehab know you are ready to start.

## 2018-12-10 DIAGNOSIS — R011 Cardiac murmur, unspecified: Secondary | ICD-10-CM | POA: Diagnosis not present

## 2018-12-10 DIAGNOSIS — R399 Unspecified symptoms and signs involving the genitourinary system: Secondary | ICD-10-CM | POA: Diagnosis not present

## 2018-12-10 DIAGNOSIS — E782 Mixed hyperlipidemia: Secondary | ICD-10-CM | POA: Diagnosis not present

## 2018-12-10 DIAGNOSIS — E559 Vitamin D deficiency, unspecified: Secondary | ICD-10-CM | POA: Diagnosis not present

## 2018-12-10 DIAGNOSIS — E039 Hypothyroidism, unspecified: Secondary | ICD-10-CM | POA: Diagnosis not present

## 2018-12-10 DIAGNOSIS — R7309 Other abnormal glucose: Secondary | ICD-10-CM | POA: Diagnosis not present

## 2018-12-10 DIAGNOSIS — I350 Nonrheumatic aortic (valve) stenosis: Secondary | ICD-10-CM | POA: Diagnosis not present

## 2018-12-10 DIAGNOSIS — R05 Cough: Secondary | ICD-10-CM | POA: Diagnosis not present

## 2018-12-10 DIAGNOSIS — I10 Essential (primary) hypertension: Secondary | ICD-10-CM | POA: Diagnosis not present

## 2018-12-10 MED ORDER — CEPHALEXIN 500 MG PO CAPS: ORAL_CAPSULE | ORAL | 6 refills | Status: DC

## 2018-12-10 NOTE — Telephone Encounter (Signed)
Spoke with pt, aware she will need antibiotics prior to any and all dental procedures. She is allergic to amoxicillin. Script for cephalexin called to pharmacy.

## 2018-12-10 NOTE — Telephone Encounter (Signed)
Given her recent aortic valve replacement, she would need antibiotic prophylaxis for any dental procedures.

## 2019-01-02 NOTE — Telephone Encounter (Signed)
Spoke with pt, discussed antibiotic procedure prior to dental work. Questions regarding cardiac rehab answered.

## 2019-01-02 NOTE — Telephone Encounter (Signed)
°  Patient states she needs to speak with nurse to confirm directions of antibiotic prior to dental appt on 01/02/19.

## 2019-01-03 DIAGNOSIS — Z23 Encounter for immunization: Secondary | ICD-10-CM | POA: Diagnosis not present

## 2019-01-03 DIAGNOSIS — L309 Dermatitis, unspecified: Secondary | ICD-10-CM | POA: Diagnosis not present

## 2019-01-10 ENCOUNTER — Telehealth: Payer: Self-pay | Admitting: Cardiovascular Disease

## 2019-01-10 ENCOUNTER — Telehealth: Payer: Self-pay

## 2019-01-10 NOTE — Telephone Encounter (Signed)
Ms. Duclos contacted the office with many questions s/p CABG with Dr. Roxan Hockey 08/2018.  Patient stated she was sore in the chest and back at times especially when active.  I advised her that this is normal as she incorporates new activities into her routine.  She also stated that she did not end up attending Cardiac Rehab as she has bad arthritis and bursitis but stated that she has been walking.  I advised her the more active she is, the less aches and pains she should have.  She acknowledged receipt.  I also went over signs/ symptoms of a heart attack as she stated that before her surgery she never had any pains.  She acknowledged receipt.

## 2019-01-10 NOTE — Telephone Encounter (Signed)
° ° °  Patient has questions about taking furosemide (LASIX) 20 MG tablet  1) How much weight have you gained and in what time span?   2) If swelling, where is the swelling located? ankles  3) Are you currently taking a fluid pill? Patient states she has not been taking medication in months  4) Are you currently SOB? no  5) Do you have a log of your daily weights (if so, list)? 214 today  6) Have you gained 3 pounds in a day or 5 pounds in a week?   Have you traveled recently? no

## 2019-01-10 NOTE — Telephone Encounter (Signed)
Spoke with pt who states when she go out to eat, she notice she had a little leg swelling in her ankle but weight stays pretty consistent (217-216(night), 214(morning)). Pt states when she cooks at home she doesn't have that problem. Pt has lasix PRN but states she doesn't like to take it as she can't leave her house from having to run to the bathroom and she doesn't seem the swelling is bad enough for her to take it. She sates she would prefer to have a diuretic in combination with her BP pill as she's had before in the past.  She voiced her BP is normally WNL but does go up at times. Pt has an appointment scheduled with Dr. Gwenlyn Found on 1/21. Pt encouraged to monitor salt intake, elevate legs, and discuss med changes with MD at appointment. Pt states even the swelling gets worse she will take a lasix and will keep scheduled appointment.

## 2019-01-11 ENCOUNTER — Telehealth (HOSPITAL_COMMUNITY): Payer: Self-pay

## 2019-01-15 ENCOUNTER — Ambulatory Visit: Payer: PPO | Admitting: Cardiovascular Disease

## 2019-01-15 ENCOUNTER — Encounter: Payer: Self-pay | Admitting: Cardiovascular Disease

## 2019-01-15 VITALS — BP 150/88

## 2019-01-15 DIAGNOSIS — I10 Essential (primary) hypertension: Secondary | ICD-10-CM

## 2019-01-15 DIAGNOSIS — Z951 Presence of aortocoronary bypass graft: Secondary | ICD-10-CM

## 2019-01-15 DIAGNOSIS — E7800 Pure hypercholesterolemia, unspecified: Secondary | ICD-10-CM

## 2019-01-15 DIAGNOSIS — Z952 Presence of prosthetic heart valve: Secondary | ICD-10-CM | POA: Diagnosis not present

## 2019-01-15 MED ORDER — LOSARTAN POTASSIUM-HCTZ 50-12.5 MG PO TABS: 1.0000 | ORAL_TABLET | Freq: Every day | ORAL | 3 refills | Status: DC

## 2019-01-15 MED ORDER — ASPIRIN 81 MG PO TBEC: 81.0000 mg | DELAYED_RELEASE_TABLET | Freq: Every day | ORAL | Status: AC

## 2019-01-15 NOTE — Progress Notes (Signed)
01/15/2019 CHARRISE [name]   1946/04/17  [id number]  Primary Physician Jamesetta Orleans, PA-C Primary Cardiologist: Lorretta Harp MD FACP, [place], [place], [place]  HPI:  [name] [name] is a 73 y.o.   mildly overweight married Caucasian female para 3, her mother to 75 grandchildrenwho I last saw in the office  09/04/2018.  Is accompanied by her daughter [name] [name] today.  Shewasself-referred to be established in our practice, a friend of hers is my patient. She is retired from [employer] in [place] and moved to [place] 17 years ago here at her primary care physician is Dr. Clyda [name]. Her cardiovascular risk factor profile is notable for family history of heart disease with both parents died of myocardial infarctions in their 60s and 42s and a brother who died of an MI at 57. She has never had a heart attack or a clinical stroke. Risk factors otherwise remarkable for treated hypertension and hyperlipidemia. She does have GERD. She had negative nuclear stress test and [facility]. She does have a history of a murmurand had a recent 2-D echo performed 01/30/18 revealing progression of her aortic stenosis now with a peak gradient of 70 and a valve area 1.17 cm. She does complain of some dyspnea on exertion. She denies chest pain. Because of symptoms of dyspnea as well as a 2D echo that revealed severe lAS.I performed outpatient cardiac cath on her 09/10/2018 revealing a total mid RCA with high-grade proximal LAD disease and severe aortic stenosis.  She ultimately underwent CABG x2 with a LIMA to her LAD and a vein to the PDA as well as pericardial tissue aortic valve replacement by Dr. Roxan Hockey 09/24/2018 and was discharged a week later.  She is recuperating nicely.  Her symptoms preoperative dyspnea have resolved    No outpatient medications have been marked as taking for the 01/15/19 encounter (Office Visit) with  Lorretta Harp, MD.     Allergies  Allergen Reactions  . Amoxicillin Rash    Has patient had a PCN reaction causing immediate rash, facial/tongue/throat swelling, SOB or lightheadedness with hypotension: No Has patient had a PCN reaction causing severe rash involving mucus membranes or skin necrosis: No Has patient had a PCN reaction that required hospitalization: No Has patient had a PCN reaction occurring within the last 10 years: No If all of the above answers are "NO", then may proceed with Cephalosporin use.    Marland Kitchen Amoxicillin-Pot Clavulanate Rash    Has patient had a PCN reaction causing immediate rash, facial/tongue/throat swelling, SOB or lightheadedness with hypotension: No Has patient had a PCN reaction causing severe rash involving mucus membranes or skin necrosis: No Has patient had a PCN reaction that required hospitalization: No Has patient had a PCN reaction occurring within the last 10 years: No If all of the above answers are "NO", then may proceed with Cephalosporin use.   . Ampicillin Rash  . Clarithromycin Rash  . Clopidogrel Bisulfate Rash  . Nitrofurantoin Itching  . Plavix [Clopidogrel Bisulfate] Rash  . Zestril [Lisinopril] Rash    Social History   Socioeconomic History  . Marital status: Widowed    Spouse name: Not on file  . Number of children: Not on file  . Years of education: Not on file  . Highest education level: Not on file  Occupational History  . Not on file  Social Needs  . Financial resource strain: Not on file  .  Food insecurity:    Worry: Not on file    Inability: Not on file  . Transportation needs:    Medical: Not on file    Non-medical: Not on file  Tobacco Use  . Smoking status: Former Smoker    Packs/day: 0.80    Years: 20.00    Pack years: 16.00    Types: Cigarettes    Last attempt to quit: 09/21/1987    Years since quitting: 31.3  . Smokeless tobacco: Never Used  . Tobacco comment: socially  Substance and Sexual  Activity  . Alcohol use: No  . Drug use: No  . Sexual activity: Not on file  Lifestyle  . Physical activity:    Days per week: Not on file    Minutes per session: Not on file  . Stress: Not on file  Relationships  . Social connections:    Talks on phone: Not on file    Gets together: Not on file    Attends religious service: Not on file    Active member of club or organization: Not on file    Attends meetings of clubs or organizations: Not on file    Relationship status: Not on file  . Intimate partner violence:    Fear of current or ex partner: Not on file    Emotionally abused: Not on file    Physically abused: Not on file    Forced sexual activity: Not on file  Other Topics Concern  . Not on file  Social History Narrative  . Not on file     Review of Systems: General: negative for chills, fever, night sweats or weight changes.  Cardiovascular: negative for chest pain, dyspnea on exertion, edema, orthopnea, palpitations, paroxysmal nocturnal dyspnea or shortness of breath Dermatological: negative for rash Respiratory: negative for cough or wheezing Urologic: negative for hematuria Abdominal: negative for nausea, vomiting, diarrhea, bright red blood per rectum, melena, or hematemesis Neurologic: negative for visual changes, syncope, or dizziness All other systems reviewed and are otherwise negative except as noted above.    There were no vitals taken for this visit.  General appearance: alert and no distress Neck: no adenopathy, no carotid bruit, no JVD, supple, symmetrical, trachea midline and thyroid not enlarged, symmetric, no tenderness/mass/nodules Lungs: clear to auscultation bilaterally Heart: regular rate and rhythm, S1, S2 normal, no murmur, click, rub or gallop Extremities: extremities normal, atraumatic, no cyanosis or edema Pulses: 2+ and symmetric Skin: Skin color, texture, turgor normal. No rashes or lesions Neurologic: Alert and oriented X 3, normal  strength and tone. Normal symmetric reflexes. Normal coordination and gait  EKG not performed today  ASSESSMENT AND PLAN:   HYPERCHOLESTEROLEMIA History of hyperlipidemia on low-dose statin therapy intolerant to statin drugs with an LDL of 109, not at goal.  She is a good candidate for Repatha which we will pursue  Essential hypertension History of essential hypertension her blood pressure measured today 140/100.  She is on metoprolol and losartan.  She was on low-dose hydrochlorothiazide presurgery which we will restart her on.  S/P CABG x 2 His history of CAD and severe aortic stenosis status post cardiac catheterization by myself 10/10/2018 revealing two-vessel disease with severe AS.  She separately underwent aortic valve replacement by Dr. Roxan Hockey with a 23 mm Edwards Inspiris Resilia pericardial valve was CABG x2 with a LIMA to the LAD and a vein to the posterior descending artery.  She was discharged home within a week.  She did have some brief PAF  during her hospitalization and was on amiodarone which was discontinued.  Since being at home her dyspnea has resolved.      Lorretta Harp MD FACP,FACC,FAHA, [facility] 01/15/2019 4:46 PM

## 2019-01-15 NOTE — Assessment & Plan Note (Signed)
His history of CAD and severe aortic stenosis status post cardiac catheterization by myself 10/10/2018 revealing two-vessel disease with severe AS.  She separately underwent aortic valve replacement by Dr. Roxan Hockey with a 23 mm Edwards Inspiris Resilia pericardial valve was CABG x2 with a LIMA to the LAD and a vein to the posterior descending artery.  She was discharged home within a week.  She did have some brief PAF during her hospitalization and was on amiodarone which was discontinued.  Since being at home her dyspnea has resolved.

## 2019-01-15 NOTE — Assessment & Plan Note (Signed)
History of hyperlipidemia on low-dose statin therapy intolerant to statin drugs with an LDL of 109, not at goal.  She is a good candidate for Repatha which we will pursue

## 2019-01-15 NOTE — Patient Instructions (Signed)
Medication Instructions:  STOP TAKING YOUR FUROSEMIDE.  STOP TAKING YOUR LOSARTAN.  STOP TAKING YOUR ASPIRIN 325 MG. START TAKING ASPIRIN 81 MG INSTEAD. TAKE ONE TABLET BY MOUTH DAILY.  START TAKING LOSARTAN-HYDROCHLOROTHIAZIDE 50-12.5 MG, ONE TABLET BY MOUTH DAILY If you need a refill on your cardiac medications before your next appointment, please call your pharmacy.   CHECK PRICING ON METOPROLOL TARTRATE (TWICE DAILY) VS METOPROLOL SUCCINATE (ONCE DAILY)  WE WILL START PAPERWORK TO GET REPATHA COVERED BY YOUR INSURANCE COMPANY  Lab work: NONE  If you have labs (blood work) drawn today and your tests are completely normal, you will receive your results only by: Marland Kitchen MyChart Message (if you have MyChart) OR . A paper copy in the mail If you have any lab test that is abnormal or we need to change your treatment, we will call you to review the results.  Testing/Procedures: Your physician has requested that you have an echocardiogram. Echocardiography is a painless test that uses sound waves to create images of your heart. It provides your doctor with information about the size and shape of your heart and how well your heart's chambers and valves are working. This procedure takes approximately one hour. There are no restrictions for this procedure.    Follow-Up: At [facility], you and your health needs are our priority.  As part of our continuing mission to provide you with exceptional heart care, we have created designated Provider Care Teams.  These Care Teams include your primary Cardiologist (physician) and Advanced Practice Providers (APPs -  Physician Assistants and Nurse Practitioners) who all work together to provide you with the care you need, when you need it. . You will need a follow up appointment in 6 months with an APP and 12 months with Dr. Gwenlyn Found.  Please call our office 2 months in advance to schedule each appointment.  You may see Dr. Gwenlyn Found or one of the following  Advanced Practice Providers on your designated Care Team:   . Kerin Ransom, [place] . Almyra Deforest, PA-C . Fabian Sharp, PA-C . Jory Sims, DNP . Rosaria Ferries, PA-C . Roby Lofts, PA-C . Sande Rives, PA-C  Any Other Special Instructions Will Be Listed Below (If Applicable). CHECK YOUR BLOOD PRESSURE DAILY AND KEEP A DAILY BLOOD PRESSURE LOG FOLLOW UP WITH PHARMD IN 1 MONTH FOLLOW UP WITH PHARMD FOR REPATHA

## 2019-01-15 NOTE — Assessment & Plan Note (Signed)
History of essential hypertension her blood pressure measured today 140/100.  She is on metoprolol and losartan.  She was on low-dose hydrochlorothiazide presurgery which we will restart her on.

## 2019-01-16 ENCOUNTER — Telehealth (HOSPITAL_COMMUNITY): Payer: Self-pay

## 2019-01-16 ENCOUNTER — Telehealth: Payer: Self-pay | Admitting: *Deleted

## 2019-01-16 ENCOUNTER — Telehealth (HOSPITAL_COMMUNITY): Payer: Self-pay | Admitting: Pharmacy Technician

## 2019-01-16 NOTE — Telephone Encounter (Signed)
Left message for patient to call and schedule Echo appointment and 1 month Hypertension Clinic appointment

## 2019-01-16 NOTE — Telephone Encounter (Signed)
Pt called and change her orientation time, she will come in for orientation on 01/22/2019 @ 730AM

## 2019-01-17 DIAGNOSIS — N390 Urinary tract infection, site not specified: Secondary | ICD-10-CM | POA: Diagnosis not present

## 2019-01-17 NOTE — Telephone Encounter (Signed)
APPOINTMENT ARRANGE BY SCHEDULER

## 2019-01-17 NOTE — Telephone Encounter (Signed)
Follow up      Pt returning call to schedule appt for 1 month Hypertension Clinic appointment. Attempted to schedule but pt questioned when it should be schedule and that I didn't know. Please follow up

## 2019-01-18 NOTE — Telephone Encounter (Signed)
Cardiac Rehab - Pharmacy Resident Documentation   Patient unable to be reached after three call attempts. Please complete allergy verification and medication review during patient's cardiac rehab appointment.    Brendolyn Patty, PharmD PGY1 Pharmacy Resident Phone [phone]  01/18/2019   2:59 PM

## 2019-01-20 NOTE — Progress Notes (Signed)
[name] [name] 73 y.o. female DOB 1946-08-16 MRN [id number]       Nutrition Screener Note  No diagnosis found. Past Medical History:  Diagnosis Date  . Aortic stenosis, severe 08/2018   progressed from moderate by ECHO 01-03-17   . Arthritis   . Cancer ([place])    basal cell skin cancer  . Carotid artery bruit   . Claustrophobia   . Coronary artery disease   . Diverticulosis   . Family history of heart disease   . GERD (gastroesophageal reflux disease)   . Gout   . History of hiatal hernia   . History of kidney stones   . Hyperlipemia   . Hypertension   . Irritable bowel syndrome   . Thyroid disease   . UTI (urinary tract infection) 03/09/14   Meds reviewed.    Current Outpatient Medications (Endocrine & Metabolic):  .  levothyroxine (SYNTHROID, LEVOTHROID) 125 MCG tablet, Take 125 mcg by mouth daily before breakfast.  Current Outpatient Medications (Cardiovascular):  .  amiodarone (PACERONE) 200 MG tablet, Take 1 tablet (200 mg total) by mouth 2 (two) times daily. For one week then take 200 mg by mouth daily thereafter .  atorvastatin (LIPITOR) 10 MG tablet, Take 1 tablet (10 mg total) by mouth daily at 6 PM. .  losartan-hydrochlorothiazide (HYZAAR) 50-12.5 MG tablet, Take 1 tablet by mouth daily. .  metoprolol tartrate (LOPRESSOR) 25 MG tablet, Take 1 tablet (25 mg total) by mouth 2 (two) times daily.   Current Outpatient Medications (Analgesics):  .  acetaminophen (TYLENOL) 650 MG CR tablet, Take 1,300 mg by mouth every 8 (eight) hours as needed for pain. Marland Kitchen  allopurinol (ZYLOPRIM) 100 MG tablet, Take 1 tablet (100 mg total) by mouth daily. Marland Kitchen  aspirin 81 MG EC tablet, Take 1 tablet (81 mg total) by mouth daily. .  traMADol (ULTRAM) 50 MG tablet, Take 50 mg by mouth every 4-6 hours PRN severe pain  Current Outpatient Medications (Hematological):  .  ferrous sulfate 325 (65 FE) MG tablet, Take 1 tablet (325 mg total) by mouth daily with breakfast. For one month then  stop.  Current Outpatient Medications (Other):  .  cephALEXin (KEFLEX) 500 MG capsule, Take all 4 tablets one hour prior to dental procedure .  Cholecalciferol (VITAMIN D) 2000 units tablet, Take 1 tablet by mouth daily. Marland Kitchen  dicyclomine (BENTYL) 10 MG capsule, Take 10 mg by mouth 4 (four) times daily as needed for spasms.  .  lansoprazole (PREVACID) 30 MG capsule, Take 30 mg by mouth daily as needed (heartburn).  .  Liniments (SALONPAS PAIN RELIEF PATCH EX), Apply 1 patch topically daily as needed (pain). .  meclizine (ANTIVERT) 25 MG tablet, Take 25 mg by mouth 3 (three) times daily as needed for dizziness.  .  potassium chloride (K-DUR) 10 MEQ tablet, Take 1 tablet (10 mEq total) by mouth daily as needed. Marland Kitchen  Propylene Glycol (SYSTANE BALANCE) 0.6 % SOLN, Place 1 drop into both eyes 2 (two) times daily as needed (dry eyes). .  trimethoprim (TRIMPEX) 100 MG tablet, Take 100 mg by mouth daily as needed (For UTI Prevention).    HT: Ht Readings from Last 1 Encounters:  10/30/18 5\' 5"  (1.651 m)    WT: Wt Readings from Last 5 Encounters:  10/30/18 218 lb 3.2 oz (99 kg)  10/11/18 219 lb 6.4 oz (99.5 kg)  09/30/18 226 lb 6.6 oz (102.7 kg)  09/21/18 222 lb (100.7 kg)  09/20/18 222 lb (100.7 kg)  BMI = 36.31  Current tobacco use? No       Labs:  Lipid Panel     Component Value Date/Time   CHOL 229 (H) 09/05/2018 0940   TRIG 284 (H) 09/05/2018 0940   HDL 41 09/05/2018 0940   CHOLHDL 5.6 (H) 09/05/2018 0940   LDLCALC 131 (H) 09/05/2018 0940    Lab Results  Component Value Date   HGBA1C 5.8 (H) 09/21/2018   CBG (last 3)  No results for input(s): GLUCAP in the last 72 hours.  Nutrition Diagnosis ? Food-and nutrition-related knowledge deficit related to lack of exposure to information as related to diagnosis of: ? CVD ? Pre-diabetes ? Obese  II = 35-39.9 related to excessive energy intake as evidenced by a BMI 36.31   10/30/18  Nutrition Goal(s):  ? To be determined  Plan:   Pt to attend nutrition classes ? Nutrition I ? Nutrition II ? Portion Distortion  Will provide client-centered nutrition education as part of interdisciplinary care.   Monitor and evaluate progress toward nutrition goal with team.  Laurina Bustle, MS, RD, LDN 01/20/2019 1:09 PM

## 2019-01-21 ENCOUNTER — Ambulatory Visit (HOSPITAL_COMMUNITY): Payer: PPO | Attending: Internal Medicine

## 2019-01-21 DIAGNOSIS — Z952 Presence of prosthetic heart valve: Secondary | ICD-10-CM

## 2019-01-22 ENCOUNTER — Encounter (HOSPITAL_COMMUNITY)
Admission: RE | Admit: 2019-01-22 | Discharge: 2019-01-22 | Disposition: A | Discharge status: Home / Self Care | Payer: PPO | Source: Ambulatory Visit | Attending: Cardiovascular Disease | Admitting: Cardiovascular Disease

## 2019-01-22 ENCOUNTER — Encounter (HOSPITAL_COMMUNITY): Payer: Self-pay

## 2019-01-22 ENCOUNTER — Other Ambulatory Visit: Payer: Self-pay | Admitting: *Deleted

## 2019-01-22 ENCOUNTER — Telehealth: Payer: Self-pay | Admitting: Cardiovascular Disease

## 2019-01-22 VITALS — BP 130/84 | HR 84 | Ht 65.0 in | Wt 220.9 lb

## 2019-01-22 DIAGNOSIS — Z951 Presence of aortocoronary bypass graft: Secondary | ICD-10-CM | POA: Diagnosis not present

## 2019-01-22 DIAGNOSIS — I712 Thoracic aortic aneurysm, without rupture, unspecified: Secondary | ICD-10-CM

## 2019-01-22 DIAGNOSIS — Z952 Presence of prosthetic heart valve: Secondary | ICD-10-CM | POA: Diagnosis not present

## 2019-01-22 NOTE — Telephone Encounter (Signed)
Spoke with Verdis Frederickson from [facility] who report during today's session pt had intermittent PVC's. She report pt is asymptomatic and has already left but she will fax over strips for Dr. Gwenlyn Found to review. Will route message to MD and nurse.

## 2019-01-22 NOTE — Telephone Encounter (Signed)
Spoke with Verdis Frederickson, RN from [facility] .Informed that Dr. Gwenlyn Found reviewed the EKG tracings that she faxed over today and although abnormal they are not concerning. Nurse verbalized understanding

## 2019-01-22 NOTE — Telephone Encounter (Signed)
° ° °  Verdis Frederickson from [facility] calling to report patient had intermittent pPVC's today, asymptomatic. Faxing over note.  Please call [phone]

## 2019-01-22 NOTE — Progress Notes (Signed)
Cardiac Individual Treatment Plan  Patient Details  Name: [name] [name] MRN: [id number] Date of Birth: 04-28-1946 Referring Provider:     [facility] from 01/22/2019 in [place]  Referring Provider  Quay Burow, MD      Initial Encounter Date:    CARDIAC REHAB PHASE II ORIENTATION from 01/22/2019 in [place]  Date  01/22/19      Visit Diagnosis: 09/24/18 CABG x2  09/24/18 AVR  Patient's Home Medications on Admission:  Current Outpatient Medications:  .  acetaminophen (TYLENOL) 650 MG CR tablet, Take 1,300 mg by mouth every 8 (eight) hours as needed for pain., Disp: , Rfl:  .  allopurinol (ZYLOPRIM) 100 MG tablet, Take 1 tablet (100 mg total) by mouth daily., Disp: 30 tablet, Rfl: 0 .  amiodarone (PACERONE) 200 MG tablet, Take 1 tablet (200 mg total) by mouth 2 (two) times daily. For one week then take 200 mg by mouth daily thereafter, Disp: 30 tablet, Rfl: 1 .  aspirin 81 MG EC tablet, Take 1 tablet (81 mg total) by mouth daily., Disp: , Rfl:  .  atorvastatin (LIPITOR) 10 MG tablet, Take 1 tablet (10 mg total) by mouth daily at 6 PM., Disp: 90 tablet, Rfl: 1 .  cephALEXin (KEFLEX) 500 MG capsule, Take all 4 tablets one hour prior to dental procedure, Disp: 4 capsule, Rfl: 6 .  Cholecalciferol (VITAMIN D) 2000 units tablet, Take 1 tablet by mouth daily., Disp: , Rfl:  .  dicyclomine (BENTYL) 10 MG capsule, Take 10 mg by mouth 4 (four) times daily as needed for spasms. , Disp: , Rfl:  .  ferrous sulfate 325 (65 FE) MG tablet, Take 1 tablet (325 mg total) by mouth daily with breakfast. For one month then stop., Disp: 30 tablet, Rfl: 0 .  lansoprazole (PREVACID) 30 MG capsule, Take 30 mg by mouth daily as needed (heartburn). , Disp: , Rfl:  .  levothyroxine (SYNTHROID, LEVOTHROID) 125 MCG tablet, Take 125 mcg by mouth daily before breakfast., Disp: , Rfl:  .  Liniments (SALONPAS PAIN RELIEF PATCH  EX), Apply 1 patch topically daily as needed (pain)., Disp: , Rfl:  .  losartan-hydrochlorothiazide (HYZAAR) 50-12.5 MG tablet, Take 1 tablet by mouth daily., Disp: 90 tablet, Rfl: 3 .  meclizine (ANTIVERT) 25 MG tablet, Take 25 mg by mouth 3 (three) times daily as needed for dizziness. , Disp: , Rfl:  .  metoprolol tartrate (LOPRESSOR) 25 MG tablet, Take 1 tablet (25 mg total) by mouth 2 (two) times daily., Disp: 90 tablet, Rfl: 1 .  potassium chloride (K-DUR) 10 MEQ tablet, Take 1 tablet (10 mEq total) by mouth daily as needed., Disp: 30 tablet, Rfl: 1 .  Propylene Glycol (SYSTANE BALANCE) 0.6 % SOLN, Place 1 drop into both eyes 2 (two) times daily as needed (dry eyes)., Disp: , Rfl:  .  traMADol (ULTRAM) 50 MG tablet, Take 50 mg by mouth every 4-6 hours PRN severe pain, Disp: 30 tablet, Rfl: 0 .  trimethoprim (TRIMPEX) 100 MG tablet, Take 100 mg by mouth daily as needed (For UTI Prevention). , Disp: , Rfl:   Past Medical History: Past Medical History:  Diagnosis Date  . Aortic stenosis, severe 08/2018   progressed from moderate by ECHO 01-03-17   . Arthritis   . Cancer ([place])    basal cell skin cancer  . Carotid artery bruit   . Claustrophobia   . Coronary artery disease   .  Diverticulosis   . Family history of heart disease   . GERD (gastroesophageal reflux disease)   . Gout   . History of hiatal hernia   . History of kidney stones   . Hyperlipemia   . Hypertension   . Irritable bowel syndrome   . Thyroid disease   . UTI (urinary tract infection) 03/09/14    Tobacco Use: Social History   Tobacco Use  Smoking Status Former Smoker  . Packs/day: 0.80  . Years: 20.00  . Pack years: 16.00  . Types: Cigarettes  . Last attempt to quit: 09/21/1987  . Years since quitting: 31.3  Smokeless Tobacco Never Used  Tobacco Comment   socially    Labs: Recent Review Flowsheet Data    Labs for ITP Cardiac and Pulmonary Rehab Latest Ref Rng & Units 09/24/2018 09/24/2018 09/24/2018  09/24/2018 09/25/2018   Cholestrol 100 - 199 mg/dL - - - - -   LDLCALC 0 - 99 mg/dL - - - - -   HDL >39 mg/dL - - - - -   Trlycerides 0 - 149 mg/dL - - - - -   Hemoglobin A1c 4.8 - 5.6 % - - - - -   PHART 7.350 - 7.450 7.246(L) 7.374 7.373 - -   PCO2ART 32.0 - 48.0 mmHg 55.0(H) 41.7 38.8 - -   HCO3 20.0 - 28.0 mmol/L 22.8 24.4 22.7 - -   TCO2 22 - 32 mmol/L 24 26 24 25 25    ACIDBASEDEF 0.0 - 2.0 mmol/L 3.0(H) 1.0 2.0 - -   O2SAT % 97.0 99.0 97.0 - -      Capillary Blood Glucose: Lab Results  Component Value Date   GLUCAP 116 (H) 09/30/2018   GLUCAP 117 (H) 09/29/2018   GLUCAP 94 09/29/2018   GLUCAP 100 (H) 09/29/2018   GLUCAP 117 (H) 09/29/2018     Exercise Target Goals: Exercise Program Goal: Individual exercise prescription set using results from initial 6 min walk test and THRR while considering  patient's activity barriers and safety.   Exercise Prescription Goal: Starting with aerobic activity 30 plus minutes a day, 3 days per week for initial exercise prescription. Provide home exercise prescription and guidelines that participant acknowledges understanding prior to discharge.  Activity Barriers & Risk Stratification: Activity Barriers & Cardiac Risk Stratification - 01/22/19 0812      Activity Barriers & Cardiac Risk Stratification   Activity Barriers  Arthritis;Back Problems;Right Knee Replacement;Other (comment)    Comments  Left knee discomfort, bursitis.    Cardiac Risk Stratification  High       6 Minute Walk: 6 Minute Walk    Row Name 01/22/19 0804         6 Minute Walk   Phase  Initial     Distance  740 feet     Walk Time  6 minutes     # of Rest Breaks  0     MPH  1.4     METS  1.29     RPE  12     Perceived Dyspnea   0     VO2 Peak  4.51     Symptoms  No     Resting HR  84 bpm     Resting BP  130/84     Resting Oxygen Saturation   95 %     Exercise Oxygen Saturation  during 6 min walk  96 %     Max Ex. HR  107 bpm  Max Ex. BP  138/82      2 Minute Post BP  120/80        Oxygen Initial Assessment:   Oxygen Re-Evaluation:   Oxygen Discharge (Final Oxygen Re-Evaluation):   Initial Exercise Prescription: Initial Exercise Prescription - 01/22/19 1100      Date of Initial Exercise RX and Referring Provider   Date  01/22/19    Referring Provider  Quay Burow, MD    Expected Discharge Date  04/29/19      NuStep   Level  1    SPM  85    Minutes  10    METs  1.8      Arm Ergometer   Level  1    Watts  15    Minutes  10    METs  1.79      Track   Laps  5    Minutes  10    METs  1.84      Prescription Details   Frequency (times per week)  2    Duration  Progress to 30 minutes of continuous aerobic without signs/symptoms of physical distress      Intensity   THRR 40-80% of Max Heartrate  58-117    Ratings of Perceived Exertion  11-13    Perceived Dyspnea  0-4      Progression   Progression  Continue to progress workloads to maintain intensity without signs/symptoms of physical distress.      Resistance Training   Training Prescription  Yes    Weight  2lbs    Reps  10-15       Perform Capillary Blood Glucose checks as needed.  Exercise Prescription Changes:   Exercise Comments:   Exercise Goals and Review: Exercise Goals    Row Name 01/22/19 0813             Exercise Goals   Increase Physical Activity  Yes       Intervention  Provide advice, education, support and counseling about physical activity/exercise needs.;Develop an individualized exercise prescription for aerobic and resistive training based on initial evaluation findings, risk stratification, comorbidities and participant's personal goals.       Expected Outcomes  Short Term: Attend rehab on a regular basis to increase amount of physical activity.;Long Term: Add in home exercise to make exercise part of routine and to increase amount of physical activity.;Long Term: Exercising regularly at least 3-5 days a week.        Increase Strength and Stamina  Yes       Intervention  Provide advice, education, support and counseling about physical activity/exercise needs.;Develop an individualized exercise prescription for aerobic and resistive training based on initial evaluation findings, risk stratification, comorbidities and participant's personal goals.       Expected Outcomes  Short Term: Increase workloads from initial exercise prescription for resistance, speed, and METs.;Short Term: Perform resistance training exercises routinely during rehab and add in resistance training at home;Long Term: Improve cardiorespiratory fitness, muscular endurance and strength as measured by increased METs and functional capacity (6MWT)       Able to understand and use rate of perceived exertion (RPE) scale  Yes       Intervention  Provide education and explanation on how to use RPE scale       Expected Outcomes  Short Term: Able to use RPE daily in rehab to express subjective intensity level;Long Term:  Able to use RPE to guide intensity level when exercising independently  Knowledge and understanding of Target Heart Rate Range (THRR)  Yes       Intervention  Provide education and explanation of THRR including how the numbers were predicted and where they are located for reference       Expected Outcomes  Short Term: Able to state/look up THRR;Long Term: Able to use THRR to govern intensity when exercising independently;Short Term: Able to use daily as guideline for intensity in rehab       Able to check pulse independently  Yes       Intervention  Provide education and demonstration on how to check pulse in carotid and radial arteries.;Review the importance of being able to check your own pulse for safety during independent exercise       Expected Outcomes  Short Term: Able to explain why pulse checking is important during independent exercise;Long Term: Able to check pulse independently and accurately       Understanding of Exercise  Prescription  Yes       Intervention  Provide education, explanation, and written materials on patient's individual exercise prescription       Expected Outcomes  Short Term: Able to explain program exercise prescription;Long Term: Able to explain home exercise prescription to exercise independently          Exercise Goals Re-Evaluation :    Discharge Exercise Prescription (Final Exercise Prescription Changes):   Nutrition:  Target Goals: Understanding of nutrition guidelines, daily intake of sodium 1500mg , cholesterol 200mg , calories 30% from fat and 7% or less from saturated fats, daily to have 5 or more servings of fruits and vegetables.  Biometrics: Pre Biometrics - 01/22/19 0733      Pre Biometrics   Height  5\' 5"  (1.651 m)    Weight  100.2 kg    Waist Circumference  42 inches    Hip Circumference  50 inches    Waist to Hip Ratio  0.84 %    BMI (Calculated)  36.76    Triceps Skinfold  46 mm    % Body Fat  49.2 %    Grip Strength  24.5 kg    Flexibility  0 in    Single Leg Stand  2.03 seconds        Nutrition Therapy Plan and Nutrition Goals: Nutrition Therapy & Goals - 01/22/19 1047      Nutrition Therapy   Diet  heart healthy      Personal Nutrition Goals   Nutrition Goal  to be determined       Nutrition Assessments: Nutrition Assessments - 01/22/19 1048      MEDFICTS Scores   Pre Score  20       Nutrition Goals Re-Evaluation:   Nutrition Goals Discharge (Final Nutrition Goals Re-Evaluation):   Psychosocial: Target Goals: Acknowledge presence or absence of significant depression and/or stress, maximize coping skills, provide positive support system. Participant is able to verbalize types and ability to use techniques and skills needed for reducing stress and depression.  Initial Review & Psychosocial Screening: Initial Psych Review & Screening - 01/22/19 1146      Initial Review   Current issues with  None Identified      Family Dynamics    Good Support System?  Yes   Jenessa has her daughter family and friends for support     Barriers   Psychosocial barriers to participate in program  There are no identifiable barriers or psychosocial needs.      Screening Interventions   Interventions  Encouraged to exercise       Quality of Life Scores: Quality of Life - 01/22/19 1040      Quality of Life   Select  Quality of Life      Quality of Life Scores   Health/Function Pre  25.57 %    Socioeconomic Pre  30 %    Psych/Spiritual Pre  30 %    Family Pre  27.5 %    GLOBAL Pre  27.74 %      Scores of 19 and below usually indicate a poorer quality of life in these areas.  A difference of  2-3 points is a clinically meaningful difference.  A difference of 2-3 points in the total score of the Quality of Life Index has been associated with significant improvement in overall quality of life, self-image, physical symptoms, and general health in studies assessing change in quality of life.  PHQ-9: Recent Review Flowsheet Data    Depression screen [facility] 2/9 10/30/2018   Decreased Interest 0   Down, Depressed, Hopeless 0   PHQ - 2 Score 0     Interpretation of Total Score  Total Score Depression Severity:  1-4 = Minimal depression, 5-9 = Mild depression, 10-14 = Moderate depression, 15-19 = Moderately severe depression, 20-27 = Severe depression   Psychosocial Evaluation and Intervention:   Psychosocial Re-Evaluation:   Psychosocial Discharge (Final Psychosocial Re-Evaluation):   Vocational Rehabilitation: Provide vocational rehab assistance to qualifying candidates.   Vocational Rehab Evaluation & Intervention: Vocational Rehab - 01/22/19 1149      Initial Vocational Rehab Evaluation & Intervention   Assessment shows need for Vocational Rehabilitation  No   Holle is retired and does not need vocational rehab at this time      Education: Education Goals: Education classes will be provided on a weekly basis,  covering required topics. Participant will state understanding/return demonstration of topics presented.  Learning Barriers/Preferences: Learning Barriers/Preferences - 01/22/19 1120      Learning Barriers/Preferences   Learning Barriers  None    Learning Preferences  Written Material;Video;Pictoral       Education Topics: Hypertension, Hypertension Reduction -Define heart disease and high blood pressure. Discus how high blood pressure affects the body and ways to reduce high blood pressure.   Exercise and Your Heart -Discuss why it is important to exercise, the FITT principles of exercise, normal and abnormal responses to exercise, and how to exercise safely.   Angina -Discuss definition of angina, causes of angina, treatment of angina, and how to decrease risk of having angina.   Cardiac Medications -Review what the following cardiac medications are used for, how they affect the body, and side effects that may occur when taking the medications.  Medications include Aspirin, Beta blockers, calcium channel blockers, ACE Inhibitors, angiotensin receptor blockers, diuretics, digoxin, and antihyperlipidemics.   Congestive Heart Failure -Discuss the definition of CHF, how to live with CHF, the signs and symptoms of CHF, and how keep track of weight and sodium intake.   Heart Disease and Intimacy -Discus the effect sexual activity has on the heart, how changes occur during intimacy as we age, and safety during sexual activity.   Smoking Cessation / COPD -Discuss different methods to quit smoking, the health benefits of quitting smoking, and the definition of COPD.   Nutrition I: Fats -Discuss the types of cholesterol, what cholesterol does to the heart, and how cholesterol levels can be controlled.   Nutrition II: Labels -Discuss the different components of food labels  and how to read food label   Heart Parts/Heart Disease and PAD -Discuss the anatomy of the heart, the  pathway of blood circulation through the heart, and these are affected by heart disease.   Stress I: Signs and Symptoms -Discuss the causes of stress, how stress may lead to anxiety and depression, and ways to limit stress.   Stress II: Relaxation -Discuss different types of relaxation techniques to limit stress.   Warning Signs of Stroke / TIA -Discuss definition of a stroke, what the signs and symptoms are of a stroke, and how to identify when someone is having stroke.   Knowledge Questionnaire Score: Knowledge Questionnaire Score - 01/22/19 1042      Knowledge Questionnaire Score   Pre Score  20/24       Core Components/Risk Factors/Patient Goals at Admission: Personal Goals and Risk Factors at Admission - 01/22/19 1112      Core Components/Risk Factors/Patient Goals on Admission    Weight Management  Yes;Obesity    Intervention  Weight Management/Obesity: Establish reasonable short term and long term weight goals.;Obesity: Provide education and appropriate resources to help participant work on and attain dietary goals.    Admit Weight  220 lb 14.4 oz (100.2 kg)    Expected Outcomes  Short Term: Continue to assess and modify interventions until short term weight is achieved;Long Term: Adherence to nutrition and physical activity/exercise program aimed toward attainment of established weight goal;Weight Loss: Understanding of general recommendations for a balanced deficit meal plan, which promotes 1-2 lb weight loss per week and includes a negative energy balance of [phone] kcal/d;Understanding recommendations for meals to include 15-35% energy as protein, 25-35% energy from fat, 35-60% energy from carbohydrates, less than 200mg  of dietary cholesterol, 20-35 gm of total fiber daily;Understanding of distribution of calorie intake throughout the day with the consumption of 4-5 meals/snacks    Hypertension  Yes    Intervention  Provide education on lifestyle modifcations including  regular physical activity/exercise, weight management, moderate sodium restriction and increased consumption of fresh fruit, vegetables, and low fat dairy, alcohol moderation, and smoking cessation.;Monitor prescription use compliance.    Expected Outcomes  Short Term: Continued assessment and intervention until BP is < 140/108mm HG in hypertensive participants. < 130/80mm HG in hypertensive participants with diabetes, heart failure or chronic kidney disease.;Long Term: Maintenance of blood pressure at goal levels.    Lipids  Yes    Intervention  Provide education and support for participant on nutrition & aerobic/resistive exercise along with prescribed medications to achieve LDL 70mg , HDL >40mg .    Expected Outcomes  Short Term: Participant states understanding of desired cholesterol values and is compliant with medications prescribed. Participant is following exercise prescription and nutrition guidelines.;Long Term: Cholesterol controlled with medications as prescribed, with individualized exercise RX and with personalized nutrition plan. Value goals: LDL < 70mg , HDL > 40 mg.    Personal Goal Other  Yes    Personal Goal  Patient would like to increase strength in thighs and be able to walk longer without stopping.    Intervention  Provide individualized exercise prescription including aerobic and resistance training to help build strength and stamina. Provide home exercise guidelines for patient to walk at home in addition to exercise at cardiac rehab to help establish a walking routine that patient can continue upon completion of the program.     Expected Outcomes  Patient will be able to walk longer duration without stopping.       Core Components/Risk Factors/Patient  Goals Review:    Core Components/Risk Factors/Patient Goals at Discharge (Final Review):    ITP Comments: ITP Comments    Row Name 01/22/19 0906           ITP Comments  Dr. Fransico Him, Medical Director            Comments: Daleisa attended orientation from [phone] to [phone] to review rules and guidelines for program. Completed 6 minute walk test, Intitial ITP, and exercise prescription.  VSS. Telemetry-Sinus Rhythm with intermittent PVC's .  Asymptomatic. Will fax today's ECG tracings over to Dr Kennon Holter office for review.Barnet Pall, RN,BSN 01/22/2019 12:09 PM

## 2019-01-23 ENCOUNTER — Telehealth: Payer: Self-pay | Admitting: Cardiovascular Disease

## 2019-01-23 NOTE — Telephone Encounter (Signed)
New message  Pt returning call for results

## 2019-01-23 NOTE — Telephone Encounter (Addendum)
Notes recorded by Lorretta Harp, MD on 01/22/2019 at 8:11 AM EST Normal LV size and function. Well-functioning aortic bioprosthesis. Moderate dilatation of thoracic aorta which will need to be followed up by CTA. Repeat 12 months  Patient notified of results. Answered her questions about thoracic aorta dilatation and if this was noted on previous tests - per 08/2018 CT, was up on upper limites of normal.   She states she needs to arrange HTN clinic visit - scheduled for Feb 20  She states she needs labs after getting her new injectable cholesterol medication. Explained that once she is approved for Repatha and starts the medications, labs are generally checked 8-12 weeks after initiation to evaluate efficacy.   She voiced understanding and was appreciative of time taken to answer her questions.

## 2019-01-24 ENCOUNTER — Other Ambulatory Visit: Payer: Self-pay | Admitting: Pharmacist Clinician (PhC)/ Clinical Pharmacy Specialist

## 2019-01-24 ENCOUNTER — Telehealth: Payer: Self-pay | Admitting: Cardiovascular Disease

## 2019-01-24 MED ORDER — HYDROCHLOROTHIAZIDE 12.5 MG PO CAPS: 12.5000 mg | ORAL_CAPSULE | Freq: Every day | ORAL | 3 refills | Status: DC

## 2019-01-24 MED ORDER — EVOLOCUMAB 140 MG/ML ~~LOC~~ SOAJ: 140.0000 mg | SUBCUTANEOUS | 12 refills | Status: DC

## 2019-01-24 MED ORDER — LOSARTAN POTASSIUM 50 MG PO TABS: 50.0000 mg | ORAL_TABLET | Freq: Every day | ORAL | 3 refills | Status: DC

## 2019-01-24 NOTE — Telephone Encounter (Signed)
New Message   Patient not having any issue just wants her BP checked.

## 2019-01-24 NOTE — Telephone Encounter (Signed)
Phone is busy

## 2019-01-24 NOTE — Telephone Encounter (Signed)
Spoke with patient, she will come in Friday at 10:30 for teaching on injection technique with repathat

## 2019-01-24 NOTE — Telephone Encounter (Signed)
Spoke with patient and she would like to schedule an appointment to see Dr Gwenlyn Found regarding the results of her Echo and moderate dilation thoracic aorta. Scheduled follow up with to discuss further.   Patient received Reptha and need Pharm D to call to discuss being shown how to use. Will forward to Pharm D for review

## 2019-01-24 NOTE — Telephone Encounter (Signed)
° ° °  Pt c/o medication issue:  1. Name of Medication: losartan-hydrochlorothiazide (HYZAAR) 50-12.5 MG tablet  2. How are you currently taking this medication (dosage and times per day)? n/a  3. Are you having a reaction (difficulty breathing--STAT)? no  4. What is your medication issue? Medication out of stock. Pharmacy requesting medication be separated. Please send new prescriptions [facility] [place]

## 2019-01-24 NOTE — Progress Notes (Signed)
Cardiac Rehab Medication Review by a Nurse  Does the patient  feel that his/her medications are working for him/her?  yes  Has the patient been experiencing any side effects to the medications prescribed?  yes  Does the patient measure his/her own blood pressure or blood glucose at home?  yes   Does the patient have any problems obtaining medications due to transportation or finances?   no  Understanding of regimen: good Understanding of indications: good Potential of compliance: fair    Nurse comments: [name] [name] says she has been getting some high blood pressure readings at home. Offered the patient to bring her blood pressure cuff from home to compare with our reading from cardiac rehab. [name] [name] says she is not taking her Lipitor right now due to symptoms of myalgia. Patient is waiting to get started on Repatha she said.   [name] Gave RN BSN 01/24/2019 10:04 AM

## 2019-01-24 NOTE — Telephone Encounter (Signed)
Refill sent to the pharmacy electronically.  

## 2019-01-28 ENCOUNTER — Encounter: Payer: Self-pay | Admitting: Cardiovascular Disease

## 2019-01-28 DIAGNOSIS — R030 Elevated blood-pressure reading, without diagnosis of hypertension: Secondary | ICD-10-CM | POA: Diagnosis not present

## 2019-01-30 ENCOUNTER — Encounter (HOSPITAL_COMMUNITY)
Admission: RE | Admit: 2019-01-30 | Discharge: 2019-01-30 | Disposition: A | Discharge status: Home / Self Care | Payer: PPO | Source: Ambulatory Visit | Attending: Cardiovascular Disease | Admitting: Cardiovascular Disease

## 2019-01-30 ENCOUNTER — Ambulatory Visit (HOSPITAL_COMMUNITY): Payer: PPO

## 2019-01-30 DIAGNOSIS — Z952 Presence of prosthetic heart valve: Secondary | ICD-10-CM | POA: Insufficient documentation

## 2019-01-30 DIAGNOSIS — N390 Urinary tract infection, site not specified: Secondary | ICD-10-CM | POA: Diagnosis not present

## 2019-01-30 DIAGNOSIS — Z951 Presence of aortocoronary bypass graft: Secondary | ICD-10-CM | POA: Diagnosis not present

## 2019-01-30 NOTE — Progress Notes (Signed)
Daily Session Note  Patient Details  Name: [name] [name] MRN: [id number] Date of Birth: 26-Jul-1946 Referring Provider:     CARDIAC REHAB PHASE II ORIENTATION from 01/22/2019 in [place]  Referring Provider  Quay Burow, MD      Encounter Date: 01/30/2019  Check In: Session Check In - 01/30/19 1212      Check-In   Supervising physician immediately available to respond to emergencies  [facility] immediately available    Physician(s)  Dr. Teryl Lucy     Location  [facility]    Staff Present  Andi Hence, RN, BSN;[name] Durene Fruits, BS, ACSM CEP, Exercise Physiologist;Maria Whitaker, RN, Mosie Epstein, MS,ACSM CEP, Exercise Physiologist;Pachia Strum Karle Starch, RN, BSN    Medication changes reported      No    Fall or balance concerns reported     No    Tobacco Cessation  No Change    Warm-up and Cool-down  Performed as group-led instruction    Resistance Training Performed  No    VAD Patient?  No    PAD/SET Patient?  No      Pain Assessment   Currently in Pain?  No/denies    Multiple Pain Sites  No       Capillary Blood Glucose: No results found for this or any previous visit (from the past 24 hour(s)).  Exercise Prescription Changes - 01/30/19 1400      Response to Exercise   Blood Pressure (Admit)  120/60    Blood Pressure (Exercise)  136/74    Blood Pressure (Exit)  112/68    Heart Rate (Admit)  71 bpm    Heart Rate (Exercise)  97 bpm    Heart Rate (Exit)  69 bpm    Rating of Perceived Exertion (Exercise)  13    Perceived Dyspnea (Exercise)  0    Symptoms  Left Shoulder Pain    Comments  Pt oriented to exercise equipment    Duration  Progress to 30 minutes of  aerobic without signs/symptoms of physical distress    Intensity  THRR unchanged      Progression   Progression  Continue to progress workloads to maintain intensity without signs/symptoms of physical distress.    Average METs  2.2      Resistance Training   Training Prescription  No      NuStep   Level  1    SPM  85    Minutes  10    METs  1.9      Track   Laps  9    Minutes  20    METs  1.8       Social History   Tobacco Use  Smoking Status Former Smoker  . Packs/day: 0.80  . Years: 20.00  . Pack years: 16.00  . Types: Cigarettes  . Last attempt to quit: 09/21/1987  . Years since quitting: 31.3  Smokeless Tobacco Never Used  Tobacco Comment   socially    Goals Met:  Exercise tolerated well  Goals Unmet:  Not Applicable  Comments: Pt started cardiac rehab today.  Pt tolerated light exercise without difficulty. VSS, telemetry-SR with PACs, asymptomatic.  Medication list reconciled. Pt denies barriers to medicaiton compliance.  PSYCHOSOCIAL ASSESSMENT:  PHQ-0. Pt exhibits positive coping skills, hopeful outlook with supportive family. No psychosocial needs identified at this time, no psychosocial interventions necessary.  Pt oriented to exercise equipment and routine.  Understanding verbalized.    Dr. Tressia Miners  Turner is Market researcher for Cardiac Rehab at [facility].

## 2019-01-31 NOTE — Progress Notes (Signed)
Cardiac Individual Treatment Plan  Patient Details  Name: [name] [name] MRN: [id number] Date of Birth: 06/06/1946 Referring Provider:     [place] from 01/22/2019 in [place]  Referring Provider  Quay Burow, MD      Initial Encounter Date:    CARDIAC REHAB PHASE II ORIENTATION from 01/22/2019 in [place]  Date  01/22/19      Visit Diagnosis: 09/24/18 CABG x2  09/24/18 AVR  Patient's Home Medications on Admission:  Current Outpatient Medications:  .  acetaminophen (TYLENOL) 650 MG CR tablet, Take 1,300 mg by mouth every 8 (eight) hours as needed for pain., Disp: , Rfl:  .  allopurinol (ZYLOPRIM) 100 MG tablet, Take 1 tablet (100 mg total) by mouth daily., Disp: 30 tablet, Rfl: 0 .  aspirin 81 MG EC tablet, Take 1 tablet (81 mg total) by mouth daily., Disp: , Rfl:  .  cephALEXin (KEFLEX) 500 MG capsule, Take all 4 tablets one hour prior to dental procedure, Disp: 4 capsule, Rfl: 6 .  Cholecalciferol (VITAMIN D) 2000 units tablet, Take 1 tablet by mouth daily., Disp: , Rfl:  .  dicyclomine (BENTYL) 10 MG capsule, Take 10 mg by mouth 4 (four) times daily as needed for spasms. , Disp: , Rfl:  .  Evolocumab (REPATHA SURECLICK) 638 MG/ML SOAJ, Inject 140 mg into the skin every 14 (fourteen) days., Disp: 2 pen, Rfl: 12 .  hydrochlorothiazide (MICROZIDE) 12.5 MG capsule, Take 1 capsule (12.5 mg total) by mouth daily., Disp: 90 capsule, Rfl: 3 .  lansoprazole (PREVACID) 30 MG capsule, Take 30 mg by mouth daily as needed (heartburn). , Disp: , Rfl:  .  levothyroxine (SYNTHROID, LEVOTHROID) 125 MCG tablet, Take 100 mcg by mouth daily before breakfast. , Disp: , Rfl:  .  Liniments (SALONPAS PAIN RELIEF PATCH EX), Apply 1 patch topically daily as needed (pain)., Disp: , Rfl:  .  losartan (COZAAR) 50 MG tablet, Take 1 tablet (50 mg total) by mouth daily., Disp: 90 tablet, Rfl: 3 .  meclizine (ANTIVERT)  25 MG tablet, Take 25 mg by mouth 3 (three) times daily as needed for dizziness. , Disp: , Rfl:  .  metoprolol tartrate (LOPRESSOR) 25 MG tablet, Take 1 tablet (25 mg total) by mouth 2 (two) times daily., Disp: 90 tablet, Rfl: 1 .  Propylene Glycol (SYSTANE BALANCE) 0.6 % SOLN, Place 1 drop into both eyes 2 (two) times daily as needed (dry eyes)., Disp: , Rfl:  .  traMADol (ULTRAM) 50 MG tablet, Take 50 mg by mouth every 4-6 hours PRN severe pain, Disp: 30 tablet, Rfl: 0 .  trimethoprim (TRIMPEX) 100 MG tablet, Take 100 mg by mouth daily as needed (For UTI Prevention). , Disp: , Rfl:  .  atorvastatin (LIPITOR) 10 MG tablet, Take 1 tablet (10 mg total) by mouth daily at 6 PM. (Patient not taking: Reported on 01/24/2019), Disp: 90 tablet, Rfl: 1 .  potassium chloride (K-DUR) 10 MEQ tablet, Take 1 tablet (10 mEq total) by mouth daily as needed. (Patient not taking: Reported on 01/24/2019), Disp: 30 tablet, Rfl: 1  Past Medical History: Past Medical History:  Diagnosis Date  . Aortic stenosis, severe 08/2018   progressed from moderate by ECHO 01-03-17   . Arthritis   . Cancer ([place])    basal cell skin cancer  . Carotid artery bruit   . Claustrophobia   . Coronary artery disease   . Diverticulosis   .  Family history of heart disease   . GERD (gastroesophageal reflux disease)   . Gout   . History of hiatal hernia   . History of kidney stones   . Hyperlipemia   . Hypertension   . Irritable bowel syndrome   . Thyroid disease   . UTI (urinary tract infection) 03/09/14    Tobacco Use: Social History   Tobacco Use  Smoking Status Former Smoker  . Packs/day: 0.80  . Years: 20.00  . Pack years: 16.00  . Types: Cigarettes  . Last attempt to quit: 09/21/1987  . Years since quitting: 31.3  Smokeless Tobacco Never Used  Tobacco Comment   socially    Labs: Recent Review Flowsheet Data    Labs for ITP Cardiac and Pulmonary Rehab Latest Ref Rng & Units 09/24/2018 09/24/2018 09/24/2018 09/24/2018  09/25/2018   Cholestrol 100 - 199 mg/dL - - - - -   LDLCALC 0 - 99 mg/dL - - - - -   HDL >39 mg/dL - - - - -   Trlycerides 0 - 149 mg/dL - - - - -   Hemoglobin A1c 4.8 - 5.6 % - - - - -   PHART 7.350 - 7.450 7.246(L) 7.374 7.373 - -   PCO2ART 32.0 - 48.0 mmHg 55.0(H) 41.7 38.8 - -   HCO3 20.0 - 28.0 mmol/L 22.8 24.4 22.7 - -   TCO2 22 - 32 mmol/L 24 26 24 25 25    ACIDBASEDEF 0.0 - 2.0 mmol/L 3.0(H) 1.0 2.0 - -   O2SAT % 97.0 99.0 97.0 - -      Capillary Blood Glucose: Lab Results  Component Value Date   GLUCAP 116 (H) 09/30/2018   GLUCAP 117 (H) 09/29/2018   GLUCAP 94 09/29/2018   GLUCAP 100 (H) 09/29/2018   GLUCAP 117 (H) 09/29/2018     Exercise Target Goals: Exercise Program Goal: Individual exercise prescription set using results from initial 6 min walk test and THRR while considering  patient's activity barriers and safety.   Exercise Prescription Goal: Initial exercise prescription builds to 30-45 minutes a day of aerobic activity, 2-3 days per week.  Home exercise guidelines will be given to patient during program as part of exercise prescription that the participant will acknowledge.  Activity Barriers & Risk Stratification: Activity Barriers & Cardiac Risk Stratification - 01/22/19 0812      Activity Barriers & Cardiac Risk Stratification   Activity Barriers  Arthritis;Back Problems;Right Knee Replacement;Other (comment)    Comments  Left knee discomfort, bursitis.    Cardiac Risk Stratification  High       6 Minute Walk: 6 Minute Walk    Row Name 01/22/19 0804         6 Minute Walk   Phase  Initial     Distance  740 feet     Walk Time  6 minutes     # of Rest Breaks  0     MPH  1.4     METS  1.29     RPE  12     Perceived Dyspnea   0     VO2 Peak  4.51     Symptoms  No     Resting HR  84 bpm     Resting BP  130/84     Resting Oxygen Saturation   95 %     Exercise Oxygen Saturation  during 6 min walk  96 %     Max Ex. HR  107 bpm  Max Ex. BP   138/82     2 Minute Post BP  120/80        Oxygen Initial Assessment:   Oxygen Re-Evaluation:   Oxygen Discharge (Final Oxygen Re-Evaluation):   Initial Exercise Prescription: Initial Exercise Prescription - 01/22/19 1100      Date of Initial Exercise RX and Referring Provider   Date  01/22/19    Referring Provider  Quay Burow, MD    Expected Discharge Date  04/29/19      NuStep   Level  1    SPM  85    Minutes  10    METs  1.8      Arm Ergometer   Level  1    Watts  15    Minutes  10    METs  1.79      Track   Laps  5    Minutes  10    METs  1.84      Prescription Details   Frequency (times per week)  2    Duration  Progress to 30 minutes of continuous aerobic without signs/symptoms of physical distress      Intensity   THRR 40-80% of Max Heartrate  58-117    Ratings of Perceived Exertion  11-13    Perceived Dyspnea  0-4      Progression   Progression  Continue to progress workloads to maintain intensity without signs/symptoms of physical distress.      Resistance Training   Training Prescription  Yes    Weight  2lbs    Reps  10-15       Perform Capillary Blood Glucose checks as needed.  Exercise Prescription Changes: Exercise Prescription Changes    Row Name 01/30/19 1400             Response to Exercise   Blood Pressure (Admit)  120/60       Blood Pressure (Exercise)  136/74       Blood Pressure (Exit)  112/68       Heart Rate (Admit)  71 bpm       Heart Rate (Exercise)  97 bpm       Heart Rate (Exit)  69 bpm       Rating of Perceived Exertion (Exercise)  13       Perceived Dyspnea (Exercise)  0       Symptoms  Left Shoulder Pain       Comments  Pt oriented to exercise equipment       Duration  Progress to 30 minutes of  aerobic without signs/symptoms of physical distress       Intensity  THRR unchanged         Progression   Progression  Continue to progress workloads to maintain intensity without signs/symptoms of physical  distress.       Average METs  2.2         Resistance Training   Training Prescription  No         NuStep   Level  1       SPM  85       Minutes  10       METs  1.9         Track   Laps  9       Minutes  20       METs  1.8          Exercise Comments: Exercise Comments  Cool Name 01/30/19 1430           Exercise Comments  Pt's first day of exercise. Pt responded well to workloads. Pt did report left shoulder pain, will update staff on if Arm Crank will not work for her. Will continue to monitor and progress as tolerated.           Exercise Goals and Review: Exercise Goals    Row Name 01/22/19 0813             Exercise Goals   Increase Physical Activity  Yes       Intervention  Provide advice, education, support and counseling about physical activity/exercise needs.;Develop an individualized exercise prescription for aerobic and resistive training based on initial evaluation findings, risk stratification, comorbidities and participant's personal goals.       Expected Outcomes  Short Term: Attend rehab on a regular basis to increase amount of physical activity.;Long Term: Add in home exercise to make exercise part of routine and to increase amount of physical activity.;Long Term: Exercising regularly at least 3-5 days a week.       Increase Strength and Stamina  Yes       Intervention  Provide advice, education, support and counseling about physical activity/exercise needs.;Develop an individualized exercise prescription for aerobic and resistive training based on initial evaluation findings, risk stratification, comorbidities and participant's personal goals.       Expected Outcomes  Short Term: Increase workloads from initial exercise prescription for resistance, speed, and METs.;Short Term: Perform resistance training exercises routinely during rehab and add in resistance training at home;Long Term: Improve cardiorespiratory fitness, muscular endurance and strength as  measured by increased METs and functional capacity (6MWT)       Able to understand and use rate of perceived exertion (RPE) scale  Yes       Intervention  Provide education and explanation on how to use RPE scale       Expected Outcomes  Short Term: Able to use RPE daily in rehab to express subjective intensity level;Long Term:  Able to use RPE to guide intensity level when exercising independently       Knowledge and understanding of Target Heart Rate Range (THRR)  Yes       Intervention  Provide education and explanation of THRR including how the numbers were predicted and where they are located for reference       Expected Outcomes  Short Term: Able to state/look up THRR;Long Term: Able to use THRR to govern intensity when exercising independently;Short Term: Able to use daily as guideline for intensity in rehab       Able to check pulse independently  Yes       Intervention  Provide education and demonstration on how to check pulse in carotid and radial arteries.;Review the importance of being able to check your own pulse for safety during independent exercise       Expected Outcomes  Short Term: Able to explain why pulse checking is important during independent exercise;Long Term: Able to check pulse independently and accurately       Understanding of Exercise Prescription  Yes       Intervention  Provide education, explanation, and written materials on patient's individual exercise prescription       Expected Outcomes  Short Term: Able to explain program exercise prescription;Long Term: Able to explain home exercise prescription to exercise independently          Exercise Goals Re-Evaluation :  Discharge Exercise Prescription (Final Exercise Prescription Changes): Exercise Prescription Changes - 01/30/19 1400      Response to Exercise   Blood Pressure (Admit)  120/60    Blood Pressure (Exercise)  136/74    Blood Pressure (Exit)  112/68    Heart Rate (Admit)  71 bpm    Heart Rate  (Exercise)  97 bpm    Heart Rate (Exit)  69 bpm    Rating of Perceived Exertion (Exercise)  13    Perceived Dyspnea (Exercise)  0    Symptoms  Left Shoulder Pain    Comments  Pt oriented to exercise equipment    Duration  Progress to 30 minutes of  aerobic without signs/symptoms of physical distress    Intensity  THRR unchanged      Progression   Progression  Continue to progress workloads to maintain intensity without signs/symptoms of physical distress.    Average METs  2.2      Resistance Training   Training Prescription  No      NuStep   Level  1    SPM  85    Minutes  10    METs  1.9      Track   Laps  9    Minutes  20    METs  1.8       Nutrition:  Target Goals: Understanding of nutrition guidelines, daily intake of sodium 1500mg , cholesterol 200mg , calories 30% from fat and 7% or less from saturated fats, daily to have 5 or more servings of fruits and vegetables.  Biometrics: Pre Biometrics - 01/22/19 0733      Pre Biometrics   Height  5\' 5"  (1.651 m)    Weight  100.2 kg    Waist Circumference  42 inches    Hip Circumference  50 inches    Waist to Hip Ratio  0.84 %    BMI (Calculated)  36.76    Triceps Skinfold  46 mm    % Body Fat  49.2 %    Grip Strength  24.5 kg    Flexibility  0 in    Single Leg Stand  2.03 seconds        Nutrition Therapy Plan and Nutrition Goals: Nutrition Therapy & Goals - 01/22/19 1047      Nutrition Therapy   Diet  heart healthy      Personal Nutrition Goals   Nutrition Goal  to be determined       Nutrition Assessments: Nutrition Assessments - 01/22/19 1048      MEDFICTS Scores   Pre Score  20       Nutrition Goals Re-Evaluation:   Nutrition Goals Re-Evaluation:   Nutrition Goals Discharge (Final Nutrition Goals Re-Evaluation):   Psychosocial: Target Goals: Acknowledge presence or absence of significant depression and/or stress, maximize coping skills, provide positive support system. Participant is  able to verbalize types and ability to use techniques and skills needed for reducing stress and depression.  Initial Review & Psychosocial Screening: Initial Psych Review & Screening - 01/22/19 1146      Initial Review   Current issues with  None Identified      Family Dynamics   Good Support System?  Yes   Exa has her daughter family and friends for support     Barriers   Psychosocial barriers to participate in program  There are no identifiable barriers or psychosocial needs.      Screening Interventions   Interventions  Encouraged to  exercise       Quality of Life Scores: Quality of Life - 01/22/19 1040      Quality of Life   Select  Quality of Life      Quality of Life Scores   Health/Function Pre  25.57 %    Socioeconomic Pre  30 %    Psych/Spiritual Pre  30 %    Family Pre  27.5 %    GLOBAL Pre  27.74 %      Scores of 19 and below usually indicate a poorer quality of life in these areas.  A difference of  2-3 points is a clinically meaningful difference.  A difference of 2-3 points in the total score of the Quality of Life Index has been associated with significant improvement in overall quality of life, self-image, physical symptoms, and general health in studies assessing change in quality of life.  PHQ-9: Recent Review Flowsheet Data    Depression screen [facility] 2/9 01/30/2019 10/30/2018   Decreased Interest 0 0   Down, Depressed, Hopeless 0 0   PHQ - 2 Score 0 0     Interpretation of Total Score  Total Score Depression Severity:  1-4 = Minimal depression, 5-9 = Mild depression, 10-14 = Moderate depression, 15-19 = Moderately severe depression, 20-27 = Severe depression   Psychosocial Evaluation and Intervention: Psychosocial Evaluation - 01/30/19 1458      Psychosocial Evaluation & Interventions   Interventions  Encouraged to exercise with the program and follow exercise prescription    Comments  No psychosocial interventions necessary.  Suzannah enjoys  playing cards.    Expected Outcomes  Meiko will maintain a positive outlook with good coping skills.     Continue Psychosocial Services   No Follow up required       Psychosocial Re-Evaluation:   Psychosocial Discharge (Final Psychosocial Re-Evaluation):   Vocational Rehabilitation: Provide vocational rehab assistance to qualifying candidates.   Vocational Rehab Evaluation & Intervention: Vocational Rehab - 01/22/19 1149      Initial Vocational Rehab Evaluation & Intervention   Assessment shows need for Vocational Rehabilitation  No   Iridessa is retired and does not need vocational rehab at this time      Education: Education Goals: Education classes will be provided on a weekly basis, covering required topics. Participant will state understanding/return demonstration of topics presented.  Learning Barriers/Preferences: Learning Barriers/Preferences - 01/22/19 1120      Learning Barriers/Preferences   Learning Barriers  None    Learning Preferences  Written Material;Video;Pictoral       Education Topics: Count Your Pulse:  -Group instruction provided by verbal instruction, demonstration, patient participation and written materials to support subject.  Instructors address importance of being able to find your pulse and how to count your pulse when at home without a heart monitor.  Patients get hands on experience counting their pulse with staff help and individually.   Heart Attack, Angina, and Risk Factor Modification:  -Group instruction provided by verbal instruction, video, and written materials to support subject.  Instructors address signs and symptoms of angina and heart attacks.    Also discuss risk factors for heart disease and how to make changes to improve heart health risk factors.   Functional Fitness:  -Group instruction provided by verbal instruction, demonstration, patient participation, and written materials to support subject.  Instructors address  safety measures for doing things around the house.  Discuss how to get up and down off the floor, how to pick things up  properly, how to safely get out of a chair without assistance, and balance training.   Meditation and Mindfulness:  -Group instruction provided by verbal instruction, patient participation, and written materials to support subject.  Instructor addresses importance of mindfulness and meditation practice to help reduce stress and improve awareness.  Instructor also leads participants through a meditation exercise.    Stretching for Flexibility and Mobility:  -Group instruction provided by verbal instruction, patient participation, and written materials to support subject.  Instructors lead participants through series of stretches that are designed to increase flexibility thus improving mobility.  These stretches are additional exercise for major muscle groups that are typically performed during regular warm up and cool down.   Hands Only CPR:  -Group verbal, video, and participation provides a basic overview of AHA guidelines for community CPR. Role-play of emergencies allow participants the opportunity to practice calling for help and chest compression technique with discussion of AED use.   Hypertension: -Group verbal and written instruction that provides a basic overview of hypertension including the most recent diagnostic guidelines, risk factor reduction with self-care instructions and medication management.    Nutrition I class: Heart Healthy Eating:  -Group instruction provided by PowerPoint slides, verbal discussion, and written materials to support subject matter. The instructor gives an explanation and review of the Therapeutic Lifestyle Changes diet recommendations, which includes a discussion on lipid goals, dietary fat, sodium, fiber, plant stanol/sterol esters, sugar, and the components of a well-balanced, healthy diet.   Nutrition II class: Lifestyle Skills:   -Group instruction provided by PowerPoint slides, verbal discussion, and written materials to support subject matter. The instructor gives an explanation and review of label reading, grocery shopping for heart health, heart healthy recipe modifications, and ways to make healthier choices when eating out.   Diabetes Question & Answer:  -Group instruction provided by PowerPoint slides, verbal discussion, and written materials to support subject matter. The instructor gives an explanation and review of diabetes co-morbidities, pre- and post-prandial blood glucose goals, pre-exercise blood glucose goals, signs, symptoms, and treatment of hypoglycemia and hyperglycemia, and foot care basics.   Diabetes Blitz:  -Group instruction provided by PowerPoint slides, verbal discussion, and written materials to support subject matter. The instructor gives an explanation and review of the physiology behind type 1 and type 2 diabetes, diabetes medications and rational behind using different medications, pre- and post-prandial blood glucose recommendations and Hemoglobin A1c goals, diabetes diet, and exercise including blood glucose guidelines for exercising safely.    Portion Distortion:  -Group instruction provided by PowerPoint slides, verbal discussion, written materials, and food models to support subject matter. The instructor gives an explanation of serving size versus portion size, changes in portions sizes over the last 20 years, and what consists of a serving from each food group.   Stress Management:  -Group instruction provided by verbal instruction, video, and written materials to support subject matter.  Instructors review role of stress in heart disease and how to cope with stress positively.     Exercising on Your Own:  -Group instruction provided by verbal instruction, power point, and written materials to support subject.  Instructors discuss benefits of exercise, components of exercise,  frequency and intensity of exercise, and end points for exercise.  Also discuss use of nitroglycerin and activating EMS.  Review options of places to exercise outside of rehab.  Review guidelines for sex with heart disease.   Cardiac Drugs I:  -Group instruction provided by verbal instruction and written materials to  support subject.  Instructor reviews cardiac drug classes: antiplatelets, anticoagulants, beta blockers, and statins.  Instructor discusses reasons, side effects, and lifestyle considerations for each drug class.   Cardiac Drugs II:  -Group instruction provided by verbal instruction and written materials to support subject.  Instructor reviews cardiac drug classes: angiotensin converting enzyme inhibitors (ACE-I), angiotensin II receptor blockers (ARBs), nitrates, and calcium channel blockers.  Instructor discusses reasons, side effects, and lifestyle considerations for each drug class.   Anatomy and Physiology of the Circulatory System:  Group verbal and written instruction and models provide basic cardiac anatomy and physiology, with the coronary electrical and arterial systems. Review of: AMI, Angina, Valve disease, Heart Failure, Peripheral Artery Disease, Cardiac Arrhythmia, Pacemakers, and the ICD.   CARDIAC REHAB PHASE II EXERCISE from 01/30/2019 in [place]  Date  01/30/19  Educator  RN  Instruction Review Code  2- Demonstrated Understanding      Other Education:  -Group or individual verbal, written, or video instructions that support the educational goals of the cardiac rehab program.   Holiday Eating Survival Tips:  -Group instruction provided by PowerPoint slides, verbal discussion, and written materials to support subject matter. The instructor gives patients tips, tricks, and techniques to help them not only survive but enjoy the holidays despite the onslaught of food that accompanies the holidays.   Knowledge Questionnaire  Score: Knowledge Questionnaire Score - 01/22/19 1042      Knowledge Questionnaire Score   Pre Score  20/24       Core Components/Risk Factors/Patient Goals at Admission: Personal Goals and Risk Factors at Admission - 01/22/19 1112      Core Components/Risk Factors/Patient Goals on Admission    Weight Management  Yes;Obesity    Intervention  Weight Management/Obesity: Establish reasonable short term and long term weight goals.;Obesity: Provide education and appropriate resources to help participant work on and attain dietary goals.    Admit Weight  220 lb 14.4 oz (100.2 kg)    Expected Outcomes  Short Term: Continue to assess and modify interventions until short term weight is achieved;Long Term: Adherence to nutrition and physical activity/exercise program aimed toward attainment of established weight goal;Weight Loss: Understanding of general recommendations for a balanced deficit meal plan, which promotes 1-2 lb weight loss per week and includes a negative energy balance of [phone] kcal/d;Understanding recommendations for meals to include 15-35% energy as protein, 25-35% energy from fat, 35-60% energy from carbohydrates, less than 200mg  of dietary cholesterol, 20-35 gm of total fiber daily;Understanding of distribution of calorie intake throughout the day with the consumption of 4-5 meals/snacks    Hypertension  Yes    Intervention  Provide education on lifestyle modifcations including regular physical activity/exercise, weight management, moderate sodium restriction and increased consumption of fresh fruit, vegetables, and low fat dairy, alcohol moderation, and smoking cessation.;Monitor prescription use compliance.    Expected Outcomes  Short Term: Continued assessment and intervention until BP is < 140/45mm HG in hypertensive participants. < 130/63mm HG in hypertensive participants with diabetes, heart failure or chronic kidney disease.;Long Term: Maintenance of blood pressure at goal levels.     Lipids  Yes    Intervention  Provide education and support for participant on nutrition & aerobic/resistive exercise along with prescribed medications to achieve LDL 70mg , HDL >40mg .    Expected Outcomes  Short Term: Participant states understanding of desired cholesterol values and is compliant with medications prescribed. Participant is following exercise prescription and nutrition guidelines.;Long Term: Cholesterol controlled with  medications as prescribed, with individualized exercise RX and with personalized nutrition plan. Value goals: LDL < 70mg , HDL > 40 mg.    Personal Goal Other  Yes    Personal Goal  Patient would like to increase strength in thighs and be able to walk longer without stopping.    Intervention  Provide individualized exercise prescription including aerobic and resistance training to help build strength and stamina. Provide home exercise guidelines for patient to walk at home in addition to exercise at cardiac rehab to help establish a walking routine that patient can continue upon completion of the program.     Expected Outcomes  Patient will be able to walk longer duration without stopping.       Core Components/Risk Factors/Patient Goals Review:  Goals and Risk Factor Review    Row Name 01/30/19 1515             Core Components/Risk Factors/Patient Goals Review   Personal Goals Review  Weight Management/Obesity;Lipids;Hypertension       Review  Pt with multiple CAD RFs willing to participate in CR exercise.  Jacarra would like to improve her walking and increase the strength in her thighs.        Expected Outcomes  Pt will continue to participate in CR exercise, nutrition, and lifestyle modification opportunities.           Core Components/Risk Factors/Patient Goals at Discharge (Final Review):  Goals and Risk Factor Review - 01/30/19 1515      Core Components/Risk Factors/Patient Goals Review   Personal Goals Review  Weight  Management/Obesity;Lipids;Hypertension    Review  Pt with multiple CAD RFs willing to participate in CR exercise.  Evangeline would like to improve her walking and increase the strength in her thighs.     Expected Outcomes  Pt will continue to participate in CR exercise, nutrition, and lifestyle modification opportunities.        ITP Comments: ITP Comments    Row Name 01/22/19 0906 01/30/19 1438         ITP Comments  Dr. Fransico Him, Medical Director   30 Day ITP Review. Pt started exercise today and tolerated it well.          Comments: See ITP Comments.

## 2019-02-01 ENCOUNTER — Encounter (HOSPITAL_COMMUNITY): Payer: PPO

## 2019-02-04 ENCOUNTER — Ambulatory Visit (HOSPITAL_COMMUNITY): Payer: PPO

## 2019-02-04 ENCOUNTER — Encounter (HOSPITAL_COMMUNITY)
Admission: RE | Admit: 2019-02-04 | Discharge: 2019-02-04 | Disposition: A | Discharge status: Home / Self Care | Payer: PPO | Source: Ambulatory Visit | Attending: Cardiovascular Disease | Admitting: Cardiovascular Disease

## 2019-02-04 DIAGNOSIS — Z951 Presence of aortocoronary bypass graft: Secondary | ICD-10-CM

## 2019-02-04 DIAGNOSIS — Z952 Presence of prosthetic heart valve: Secondary | ICD-10-CM

## 2019-02-06 ENCOUNTER — Ambulatory Visit (HOSPITAL_COMMUNITY): Payer: PPO

## 2019-02-06 ENCOUNTER — Encounter (HOSPITAL_COMMUNITY)
Admission: RE | Admit: 2019-02-06 | Discharge: 2019-02-06 | Disposition: A | Discharge status: Home / Self Care | Payer: PPO | Source: Ambulatory Visit | Attending: Cardiovascular Disease | Admitting: Cardiovascular Disease

## 2019-02-08 ENCOUNTER — Encounter (HOSPITAL_COMMUNITY): Payer: PPO

## 2019-02-11 ENCOUNTER — Ambulatory Visit (HOSPITAL_COMMUNITY): Payer: PPO

## 2019-02-11 ENCOUNTER — Encounter (HOSPITAL_COMMUNITY)
Admission: RE | Admit: 2019-02-11 | Discharge: 2019-02-11 | Disposition: A | Discharge status: Home / Self Care | Payer: PPO | Source: Ambulatory Visit | Attending: Cardiovascular Disease | Admitting: Cardiovascular Disease

## 2019-02-11 ENCOUNTER — Encounter (HOSPITAL_COMMUNITY): Payer: PPO

## 2019-02-11 ENCOUNTER — Telehealth (HOSPITAL_COMMUNITY): Payer: Self-pay

## 2019-02-11 DIAGNOSIS — Z951 Presence of aortocoronary bypass graft: Secondary | ICD-10-CM

## 2019-02-11 DIAGNOSIS — Z952 Presence of prosthetic heart valve: Secondary | ICD-10-CM

## 2019-02-11 NOTE — Telephone Encounter (Signed)
Pt called to reschedule class times to 9:45am [facility]

## 2019-02-12 ENCOUNTER — Telehealth: Payer: Self-pay

## 2019-02-12 DIAGNOSIS — E7800 Pure hypercholesterolemia, unspecified: Secondary | ICD-10-CM

## 2019-02-12 NOTE — Telephone Encounter (Signed)
Called to let the pt know that their pa for the repatha is due for a pa 3/26 and blood work will be needed inorder to complete the pa ordered labs and the pt is compliant to come in tomorrow

## 2019-02-13 ENCOUNTER — Ambulatory Visit (HOSPITAL_COMMUNITY): Payer: PPO

## 2019-02-13 ENCOUNTER — Encounter: Payer: Self-pay | Admitting: Cardiovascular Disease

## 2019-02-13 ENCOUNTER — Encounter (HOSPITAL_COMMUNITY)
Admission: RE | Admit: 2019-02-13 | Discharge: 2019-02-13 | Disposition: A | Discharge status: Home / Self Care | Payer: PPO | Source: Ambulatory Visit | Attending: Cardiovascular Disease | Admitting: Cardiovascular Disease

## 2019-02-13 ENCOUNTER — Ambulatory Visit: Payer: PPO

## 2019-02-13 ENCOUNTER — Encounter (HOSPITAL_COMMUNITY): Payer: PPO

## 2019-02-13 ENCOUNTER — Telehealth: Payer: Self-pay | Admitting: Cardiovascular Disease

## 2019-02-13 ENCOUNTER — Ambulatory Visit: Payer: PPO | Admitting: Cardiovascular Disease

## 2019-02-13 VITALS — BP 122/64 | HR 76 | Ht 65.0 in | Wt 218.0 lb

## 2019-02-13 DIAGNOSIS — I712 Thoracic aortic aneurysm, without rupture, unspecified: Secondary | ICD-10-CM

## 2019-02-13 DIAGNOSIS — Z951 Presence of aortocoronary bypass graft: Secondary | ICD-10-CM

## 2019-02-13 DIAGNOSIS — Z952 Presence of prosthetic heart valve: Secondary | ICD-10-CM

## 2019-02-13 NOTE — Addendum Note (Signed)
Addended by: Annita Brod on: 02/13/2019 03:32 PM   Modules accepted: Orders

## 2019-02-13 NOTE — Progress Notes (Signed)
Ascending thoracic aorta measures 3.9 cm at its maximum diameter by CT a 09/21/2018.  Recent 2D echo done 01/21/2019 suggested that the thoracic aorta measured 4.7 cm, unclear increase for unclear reasons.  We will have this reread and if the dimensions are accurate I am going to repeat a CTA to further evaluate.  Otherwise, we will repeat this on annual basis.  The echo did show normal LV function with a well-functioning aortic prosthesis.   Lorretta Harp, M.D., [place], [facility], [place] [address] [place], [place]  [address]  [phone] 02/13/2019 2:26 PM

## 2019-02-13 NOTE — Patient Instructions (Addendum)
Medication Instructions:  NONE If you need a refill on your cardiac medications before your next appointment, please call your pharmacy.   Lab work: NONE If you have labs (blood work) drawn today and your tests are completely normal, you will receive your results only by: Marland Kitchen MyChart Message (if you have MyChart) OR . A paper copy in the mail If you have any lab test that is abnormal or we need to change your treatment, we will call you to review the results.  Testing/Procedures: Your physician has requested that you have an echocardiogram. Echocardiography is a painless test that uses sound waves to create images of your heart. It provides your doctor with information about the size and shape of your heart and how well your heart's chambers and valves are working. This procedure takes approximately one hour. There are no restrictions for this procedure.  SCHEDULE [name] [name]  Your physician has requested that you have a CT Angiography (CTA), which is a special type of CT scan that uses a computer to produce multi-dimensional views of major blood vessels throughout the body. In CT angiography, a contrast material is injected through an IV to help visualize the blood vessels. This test is to visualize your thoracic aortic aneurysm.   SCHEDULE ASAP AND ANNUALLY   Follow-Up: At [facility], you and your health needs are our priority.  As part of our continuing mission to provide you with exceptional heart care, we have created designated Provider Care Teams.  These Care Teams include your primary Cardiologist (physician) and Advanced Practice Providers (APPs -  Physician Assistants and Nurse Practitioners) who all work together to provide you with the care you need, when you need it. . You will need a follow up appointment in 3 months with an APP and 6 months with Dr. Gwenlyn Found.  Please call our office 2 months in advance to schedule this appointment.  You may see Dr. Gwenlyn Found or one of the following  Advanced Practice Providers on your designated Care Team:   . [name] [name], [name] [name] . [name] Deforest, [name] [name] . [name] Sharp, [name] [name] . [name] Sims, [name] [name] . [name] Ferries, [name] [name] . [name] Lofts, [name] [name] . [name] Rives, [name] [name]

## 2019-02-13 NOTE — Progress Notes (Signed)
Reviewed home exercise guidelines with patient including endpoints, temperature precautions, target heart rate and rate of perceived exertion. [facility] her mode of [facility] exercise. Pt voices understanding of instructions given. Sol Passer, MS, ACSM CEP

## 2019-02-13 NOTE — Assessment & Plan Note (Signed)
Ascending thoracic aorta measures 3.9 cm at its maximum diameter by CT a 09/21/2018.  Recent 2D echo done 01/21/2019 suggested that the thoracic aorta measured 4.7 cm, unclear increase for unclear reasons.  We will have this reread and if the dimensions are accurate I am going to repeat a CTA to further evaluate.  Otherwise, we will repeat this on annual basis.  The echo did show normal LV function with a well-functioning aortic prosthesis.

## 2019-02-13 NOTE — Telephone Encounter (Signed)
Spoke with pt dtr, explained reason for the CT scan and that it was not an emergency. They are asking for the appointment to be moved up as the patient is quite anxious. Explained we do not schedule at [facility] and I would call to see if I can get her in sooner at [facility]. CTA rescheduled for 02-15-2019 @ 1:30 pm.

## 2019-02-13 NOTE — Telephone Encounter (Signed)
New Message   PTs daughter is calling because the CT is scheduled March 10th but she is wondering if [facility] can get her in sooner if they can switch the appt to there.  She is also wondering more about what kind of CT this is  Please call

## 2019-02-14 ENCOUNTER — Other Ambulatory Visit: Payer: Self-pay | Admitting: Pharmacist Clinician (PhC)/ Clinical Pharmacy Specialist

## 2019-02-14 ENCOUNTER — Telehealth: Payer: Self-pay | Admitting: Cardiovascular Disease

## 2019-02-14 ENCOUNTER — Ambulatory Visit: Payer: PPO

## 2019-02-14 DIAGNOSIS — E7800 Pure hypercholesterolemia, unspecified: Secondary | ICD-10-CM | POA: Diagnosis not present

## 2019-02-14 DIAGNOSIS — I712 Thoracic aortic aneurysm, without rupture: Secondary | ICD-10-CM | POA: Diagnosis not present

## 2019-02-14 NOTE — Telephone Encounter (Signed)
Spoke with pt who states that she came into the office to have lab work collected and spoke with lab tech and pharmacist about getting a Rx for medication for her claustrophobia for her CT scheduled for 02/19/2019. She states she is calling to inform the office that she will request the med from her PCP office.

## 2019-02-15 ENCOUNTER — Other Ambulatory Visit (HOSPITAL_BASED_OUTPATIENT_CLINIC_OR_DEPARTMENT_OTHER): Payer: PPO

## 2019-02-15 ENCOUNTER — Encounter (HOSPITAL_COMMUNITY): Payer: PPO

## 2019-02-15 LAB — LIPID PANEL
Chol/HDL Ratio: 3.5 ratio (ref 0.0–4.4)
Cholesterol, Total: 159 mg/dL (ref 100–199)
HDL: 45 mg/dL (ref >39)
LDL Calculated: 72 mg/dL (ref 0–99)
Triglycerides: 212 mg/dL — ABNORMAL HIGH (ref 0–149)
VLDL CHOLESTEROL CAL: 42 mg/dL — AB (ref 5–40)

## 2019-02-15 LAB — CBC
Hematocrit: 43.4 % (ref 34.0–46.6)
Hemoglobin: 14.2 g/dL (ref 11.1–15.9)
MCH: 27.2 pg (ref 26.6–33.0)
MCHC: 32.7 g/dL (ref 31.5–35.7)
MCV: 83 fL (ref 79–97)
Platelets: 276 10*3/uL (ref 150–450)
RBC: 5.23 x10E6/uL (ref 3.77–5.28)
RDW: 15.2 % (ref 11.7–15.4)
WBC: 6.3 10*3/uL (ref 3.4–10.8)

## 2019-02-15 LAB — BASIC METABOLIC PANEL
BUN/Creatinine Ratio: 20 (ref 12–28)
BUN: 15 mg/dL (ref 8–27)
CO2: 25 mmol/L (ref 20–29)
CREATININE: 0.74 mg/dL (ref 0.57–1.00)
Calcium: 9.5 mg/dL (ref 8.7–10.3)
Chloride: 102 mmol/L (ref 96–106)
GFR calc Af Amer: 92 mL/min/{1.73_m2} (ref >59)
GFR, EST NON AFRICAN AMERICAN: 80 mL/min/{1.73_m2} (ref >59)
Glucose: 97 mg/dL (ref 65–99)
Potassium: 4.4 mmol/L (ref 3.5–5.2)
Sodium: 143 mmol/L (ref 134–144)

## 2019-02-15 LAB — HEPATIC FUNCTION PANEL
ALBUMIN: 4.3 g/dL (ref 3.7–4.7)
ALT: 14 IU/L (ref 0–32)
AST: 21 IU/L (ref 0–40)
Alkaline Phosphatase: 134 IU/L — ABNORMAL HIGH (ref 39–117)
BILIRUBIN TOTAL: 0.3 mg/dL (ref 0.0–1.2)
Bilirubin, Direct: 0.08 mg/dL (ref 0.00–0.40)
Total Protein: 6.9 g/dL (ref 6.0–8.5)

## 2019-02-18 ENCOUNTER — Encounter (HOSPITAL_COMMUNITY): Payer: PPO

## 2019-02-18 ENCOUNTER — Other Ambulatory Visit: Payer: Self-pay

## 2019-02-18 ENCOUNTER — Encounter (HOSPITAL_COMMUNITY)
Admission: RE | Admit: 2019-02-18 | Discharge: 2019-02-18 | Disposition: A | Discharge status: Home / Self Care | Payer: PPO | Source: Ambulatory Visit | Attending: Cardiovascular Disease | Admitting: Cardiovascular Disease

## 2019-02-18 ENCOUNTER — Ambulatory Visit (HOSPITAL_COMMUNITY): Payer: PPO

## 2019-02-18 DIAGNOSIS — Z951 Presence of aortocoronary bypass graft: Secondary | ICD-10-CM | POA: Diagnosis not present

## 2019-02-18 DIAGNOSIS — Z952 Presence of prosthetic heart valve: Secondary | ICD-10-CM

## 2019-02-18 MED ORDER — METOPROLOL TARTRATE 25 MG PO TABS: 25.0000 mg | ORAL_TABLET | Freq: Two times a day (BID) | ORAL | 3 refills | Status: DC

## 2019-02-18 NOTE — Progress Notes (Signed)
[name] [name] 73 y.o. female Nutrition Note Spoke with pt. Nutrition Plan and Nutrition Survey goals reviewed with pt. Pt is following a Heart Healthy diet. Pt wants to lose wt. Pt has been trying to lose wt by eating more lean protein, limiting carbohydrates. Heart healthy weight loss tips reviewed (label reading, how to build a healthy plate, portion sizes, weighing and measuring portions, eating frequently across the day). Pt last A1C was elevated. Discussed the differences between complex and refined carbs, recommended pt replace refined carbs with complex. Reviewed the benefits swapping in complex carbs and moderating portion sizes can have on managing blood glucose with patient. Per discussion, pt does not use canned/convenience foods often. Pt does not add salt to food. Pt does not eat out frequently. Pt expressed understanding of the information reviewed. Pt aware of nutrition education classes offered and does not plan on attending nutrition classes, pt shared "It's hard enough to get here just to workout". Will continue to invite pt to nutrition classes in the future.   Lab Results  Component Value Date   HGBA1C 5.8 (H) 09/21/2018    Wt Readings from Last 3 Encounters:  02/13/19 218 lb (98.9 kg)  01/22/19 220 lb 14.4 oz (100.2 kg)  10/30/18 218 lb 3.2 oz (99 kg)    Nutrition Diagnosis ? Food-and nutrition-related knowledge deficit related to lack of exposure to information as related to diagnosis of: ? CVD  ? Obese  II = 35-39.9 related to excessive energy intake as evidenced by a BMI 36.28  Nutrition Intervention ? Pt's individual nutrition plan reviewed with pt. ? Benefits of adopting Heart Healthy diet discussed when [employer] reviewed.   ? Pt given handouts for: ? Nutrition I class ? Nutrition II  Goal(s) ? Pt to identify food quantities necessary to achieve weight loss of 6-24 lb at graduation from cardiac rehab.  ? Pt to build a healthy plate including vegetables, fruits,  whole grains, and low-fat dairy products in a heart healthy meal plan. ? Pt to weigh and measure serving sizes. ? Pt to eat a variety of non-starchy vegetables.  Plan:   Pt to attend nutrition classes ? Nutrition I ? Nutrition II ? Portion Distortion   Will provide client-centered nutrition education as part of interdisciplinary care  Monitor and evaluate progress toward nutrition goal with team.    Laurina Bustle, MS, RD, LDN 02/18/2019 10:43 AM

## 2019-02-19 ENCOUNTER — Ambulatory Visit (HOSPITAL_BASED_OUTPATIENT_CLINIC_OR_DEPARTMENT_OTHER): Admission: RE | Admit: 2019-02-19 | Payer: PPO | Source: Ambulatory Visit

## 2019-02-19 ENCOUNTER — Other Ambulatory Visit: Payer: Self-pay

## 2019-02-19 ENCOUNTER — Ambulatory Visit (HOSPITAL_BASED_OUTPATIENT_CLINIC_OR_DEPARTMENT_OTHER)
Admission: RE | Admit: 2019-02-19 | Discharge: 2019-02-19 | Disposition: A | Discharge status: Home / Self Care | Payer: PPO | Source: Ambulatory Visit | Attending: Cardiovascular Disease | Admitting: Cardiovascular Disease

## 2019-02-19 ENCOUNTER — Ambulatory Visit (HOSPITAL_BASED_OUTPATIENT_CLINIC_OR_DEPARTMENT_OTHER): Payer: PPO

## 2019-02-19 DIAGNOSIS — I712 Thoracic aortic aneurysm, without rupture, unspecified: Secondary | ICD-10-CM

## 2019-02-19 MED ORDER — IOPAMIDOL (ISOVUE-300) INJECTION 61%
100.0000 mL | Freq: Once | INTRAVENOUS | Status: AC | PRN
  Administered 2019-02-19: 80 mL via INTRAVENOUS

## 2019-02-20 ENCOUNTER — Encounter (HOSPITAL_COMMUNITY)
Admission: RE | Admit: 2019-02-20 | Discharge: 2019-02-20 | Disposition: A | Discharge status: Home / Self Care | Payer: PPO | Source: Ambulatory Visit | Attending: Cardiovascular Disease | Admitting: Cardiovascular Disease

## 2019-02-20 ENCOUNTER — Encounter (HOSPITAL_COMMUNITY): Payer: PPO

## 2019-02-20 ENCOUNTER — Ambulatory Visit (HOSPITAL_COMMUNITY): Payer: PPO

## 2019-02-20 DIAGNOSIS — Z951 Presence of aortocoronary bypass graft: Secondary | ICD-10-CM

## 2019-02-20 DIAGNOSIS — Z952 Presence of prosthetic heart valve: Secondary | ICD-10-CM

## 2019-02-21 ENCOUNTER — Other Ambulatory Visit: Payer: Self-pay

## 2019-02-21 DIAGNOSIS — I712 Thoracic aortic aneurysm, without rupture, unspecified: Secondary | ICD-10-CM

## 2019-02-22 ENCOUNTER — Encounter (HOSPITAL_COMMUNITY): Payer: PPO

## 2019-02-25 ENCOUNTER — Encounter (HOSPITAL_COMMUNITY): Payer: PPO

## 2019-02-25 ENCOUNTER — Telehealth (HOSPITAL_COMMUNITY): Payer: Self-pay | Admitting: Internal Medicine

## 2019-02-25 ENCOUNTER — Ambulatory Visit (HOSPITAL_COMMUNITY): Payer: PPO

## 2019-02-26 DIAGNOSIS — D485 Neoplasm of uncertain behavior of skin: Secondary | ICD-10-CM | POA: Diagnosis not present

## 2019-02-26 DIAGNOSIS — D2271 Melanocytic nevi of right lower limb, including hip: Secondary | ICD-10-CM | POA: Diagnosis not present

## 2019-02-26 DIAGNOSIS — C44319 Basal cell carcinoma of skin of other parts of face: Secondary | ICD-10-CM | POA: Diagnosis not present

## 2019-02-26 DIAGNOSIS — Z808 Family history of malignant neoplasm of other organs or systems: Secondary | ICD-10-CM | POA: Diagnosis not present

## 2019-02-26 DIAGNOSIS — Z85828 Personal history of other malignant neoplasm of skin: Secondary | ICD-10-CM | POA: Diagnosis not present

## 2019-02-26 DIAGNOSIS — Z23 Encounter for immunization: Secondary | ICD-10-CM | POA: Diagnosis not present

## 2019-02-26 DIAGNOSIS — L821 Other seborrheic keratosis: Secondary | ICD-10-CM | POA: Diagnosis not present

## 2019-02-26 DIAGNOSIS — L309 Dermatitis, unspecified: Secondary | ICD-10-CM | POA: Diagnosis not present

## 2019-02-27 ENCOUNTER — Ambulatory Visit (HOSPITAL_COMMUNITY): Payer: PPO

## 2019-02-27 ENCOUNTER — Encounter (HOSPITAL_COMMUNITY): Payer: PPO

## 2019-02-27 ENCOUNTER — Encounter (HOSPITAL_COMMUNITY)
Admission: RE | Admit: 2019-02-27 | Discharge: 2019-02-27 | Disposition: A | Discharge status: Home / Self Care | Payer: PPO | Source: Ambulatory Visit | Attending: Cardiovascular Disease | Admitting: Cardiovascular Disease

## 2019-02-27 DIAGNOSIS — Z951 Presence of aortocoronary bypass graft: Secondary | ICD-10-CM | POA: Diagnosis not present

## 2019-02-27 DIAGNOSIS — Z952 Presence of prosthetic heart valve: Secondary | ICD-10-CM | POA: Diagnosis not present

## 2019-02-28 DIAGNOSIS — M7061 Trochanteric bursitis, right hip: Secondary | ICD-10-CM | POA: Diagnosis not present

## 2019-02-28 DIAGNOSIS — M25551 Pain in right hip: Secondary | ICD-10-CM | POA: Diagnosis not present

## 2019-02-28 DIAGNOSIS — M7062 Trochanteric bursitis, left hip: Secondary | ICD-10-CM | POA: Diagnosis not present

## 2019-02-28 DIAGNOSIS — M25552 Pain in left hip: Secondary | ICD-10-CM | POA: Diagnosis not present

## 2019-02-28 NOTE — Progress Notes (Signed)
Cardiac Individual Treatment Plan  Patient Details  Name: [name] [name] MRN: [id number] Date of Birth: 04/17/1946 Referring Provider:     [facility] from 01/22/2019 in [place]  Referring Provider  Quay Burow, MD      Initial Encounter Date:    CARDIAC REHAB PHASE II ORIENTATION from 01/22/2019 in [place]  Date  01/22/19      Visit Diagnosis: 09/24/18 CABG x2  09/24/18 AVR  Patient's Home Medications on Admission:  Current Outpatient Medications:  .  acetaminophen (TYLENOL) 650 MG CR tablet, Take 1,300 mg by mouth every 8 (eight) hours as needed for pain., Disp: , Rfl:  .  allopurinol (ZYLOPRIM) 100 MG tablet, Take 1 tablet (100 mg total) by mouth daily., Disp: 30 tablet, Rfl: 0 .  aspirin 81 MG EC tablet, Take 1 tablet (81 mg total) by mouth daily., Disp: , Rfl:  .  cephALEXin (KEFLEX) 500 MG capsule, Take all 4 tablets one hour prior to dental procedure, Disp: 4 capsule, Rfl: 6 .  Cholecalciferol (VITAMIN D) 2000 units tablet, Take 1 tablet by mouth daily., Disp: , Rfl:  .  dicyclomine (BENTYL) 10 MG capsule, Take 10 mg by mouth 4 (four) times daily as needed for spasms. , Disp: , Rfl:  .  Evolocumab (REPATHA SURECLICK) 060 MG/ML SOAJ, Inject 140 mg into the skin every 14 (fourteen) days., Disp: 2 pen, Rfl: 12 .  hydrochlorothiazide (MICROZIDE) 12.5 MG capsule, Take 1 capsule (12.5 mg total) by mouth daily., Disp: 90 capsule, Rfl: 3 .  lansoprazole (PREVACID) 30 MG capsule, Take 30 mg by mouth daily as needed (heartburn). , Disp: , Rfl:  .  levothyroxine (SYNTHROID, LEVOTHROID) 125 MCG tablet, Take 100 mcg by mouth daily before breakfast. , Disp: , Rfl:  .  losartan (COZAAR) 50 MG tablet, Take 1 tablet (50 mg total) by mouth daily., Disp: 90 tablet, Rfl: 3 .  meclizine (ANTIVERT) 25 MG tablet, Take 25 mg by mouth 3 (three) times daily as needed for dizziness. , Disp: , Rfl:  .   metoprolol tartrate (LOPRESSOR) 25 MG tablet, Take 1 tablet (25 mg total) by mouth 2 (two) times daily., Disp: 180 tablet, Rfl: 3 .  Propylene Glycol (SYSTANE BALANCE) 0.6 % SOLN, Place 1 drop into both eyes 2 (two) times daily as needed (dry eyes)., Disp: , Rfl:  .  traMADol (ULTRAM) 50 MG tablet, Take 50 mg by mouth every 4-6 hours PRN severe pain, Disp: 30 tablet, Rfl: 0 .  trimethoprim (TRIMPEX) 100 MG tablet, Take 100 mg by mouth daily as needed (For UTI Prevention). , Disp: , Rfl:   Past Medical History: Past Medical History:  Diagnosis Date  . Aortic stenosis, severe 08/2018   progressed from moderate by ECHO 01-03-17   . Arthritis   . Cancer ([place])    basal cell skin cancer  . Carotid artery bruit   . Claustrophobia   . Coronary artery disease   . Diverticulosis   . Family history of heart disease   . GERD (gastroesophageal reflux disease)   . Gout   . History of hiatal hernia   . History of kidney stones   . Hyperlipemia   . Hypertension   . Irritable bowel syndrome   . Thyroid disease   . UTI (urinary tract infection) 03/09/14    Tobacco Use: Social History   Tobacco Use  Smoking Status Former Smoker  . Packs/day: 0.80  .  Years: 20.00  . Pack years: 16.00  . Types: Cigarettes  . Last attempt to quit: 09/21/1987  . Years since quitting: 31.4  Smokeless Tobacco Never Used  Tobacco Comment   socially    Labs: Recent Review Flowsheet Data    Labs for ITP Cardiac and Pulmonary Rehab Latest Ref Rng & Units 09/24/2018 09/24/2018 09/24/2018 09/25/2018 02/14/2019   Cholestrol 100 - 199 mg/dL - - - - 159   LDLCALC 0 - 99 mg/dL - - - - 72   HDL >39 mg/dL - - - - 45   Trlycerides 0 - 149 mg/dL - - - - 212(H)   Hemoglobin A1c 4.8 - 5.6 % - - - - -   PHART 7.350 - 7.450 7.374 7.373 - - -   PCO2ART 32.0 - 48.0 mmHg 41.7 38.8 - - -   HCO3 20.0 - 28.0 mmol/L 24.4 22.7 - - -   TCO2 22 - 32 mmol/L 26 24 25 25  -   ACIDBASEDEF 0.0 - 2.0 mmol/L 1.0 2.0 - - -   O2SAT % 99.0 97.0 -  - -      Capillary Blood Glucose: Lab Results  Component Value Date   GLUCAP 116 (H) 09/30/2018   GLUCAP 117 (H) 09/29/2018   GLUCAP 94 09/29/2018   GLUCAP 100 (H) 09/29/2018   GLUCAP 117 (H) 09/29/2018     Exercise Target Goals: Exercise Program Goal: Individual exercise prescription set using results from initial 6 min walk test and THRR while considering  patient's activity barriers and safety.   Exercise Prescription Goal: Initial exercise prescription builds to 30-45 minutes a day of aerobic activity, 2-3 days per week.  Home exercise guidelines will be given to patient during program as part of exercise prescription that the participant will acknowledge.  Activity Barriers & Risk Stratification: Activity Barriers & Cardiac Risk Stratification - 01/22/19 0812      Activity Barriers & Cardiac Risk Stratification   Activity Barriers  Arthritis;Back Problems;Right Knee Replacement;Other (comment)    Comments  Left knee discomfort, bursitis.    Cardiac Risk Stratification  High       6 Minute Walk: 6 Minute Walk    Row Name 01/22/19 0804         6 Minute Walk   Phase  Initial     Distance  740 feet     Walk Time  6 minutes     # of Rest Breaks  0     MPH  1.4     METS  1.29     RPE  12     Perceived Dyspnea   0     VO2 Peak  4.51     Symptoms  No     Resting HR  84 bpm     Resting BP  130/84     Resting Oxygen Saturation   95 %     Exercise Oxygen Saturation  during 6 min walk  96 %     Max Ex. HR  107 bpm     Max Ex. BP  138/82     2 Minute Post BP  120/80        Oxygen Initial Assessment:   Oxygen Re-Evaluation:   Oxygen Discharge (Final Oxygen Re-Evaluation):   Initial Exercise Prescription: Initial Exercise Prescription - 01/22/19 1100      Date of Initial Exercise RX and Referring Provider   Date  01/22/19    Referring Provider  Quay Burow, MD  Expected Discharge Date  04/29/19      NuStep   Level  1    SPM  85    Minutes  10     METs  1.8      Arm Ergometer   Level  1    Watts  15    Minutes  10    METs  1.79      Track   Laps  5    Minutes  10    METs  1.84      Prescription Details   Frequency (times per week)  2    Duration  Progress to 30 minutes of continuous aerobic without signs/symptoms of physical distress      Intensity   THRR 40-80% of Max Heartrate  58-117    Ratings of Perceived Exertion  11-13    Perceived Dyspnea  0-4      Progression   Progression  Continue to progress workloads to maintain intensity without signs/symptoms of physical distress.      Resistance Training   Training Prescription  Yes    Weight  2lbs    Reps  10-15       Perform Capillary Blood Glucose checks as needed.  Exercise Prescription Changes: Exercise Prescription Changes    Row Name 01/30/19 1400 02/13/19 0955 02/18/19 0957         Response to Exercise   Blood Pressure (Admit)  120/60  124/80  118/80     Blood Pressure (Exercise)  136/74  122/78  138/80     Blood Pressure (Exit)  112/68  112/70  112/82     Heart Rate (Admit)  71 bpm  79 bpm  81 bpm     Heart Rate (Exercise)  97 bpm  92 bpm  93 bpm     Heart Rate (Exit)  69 bpm  71 bpm  76 bpm     Rating of Perceived Exertion (Exercise)  13  12  12      Perceived Dyspnea (Exercise)  0  0  0     Symptoms  Left Shoulder Pain  -  none     Comments  Pt oriented to exercise equipment  -  -     Duration  Progress to 30 minutes of  aerobic without signs/symptoms of physical distress  Progress to 30 minutes of  aerobic without signs/symptoms of physical distress  Progress to 30 minutes of  aerobic without signs/symptoms of physical distress     Intensity  THRR unchanged  THRR unchanged  THRR unchanged       Progression   Progression  Continue to progress workloads to maintain intensity without signs/symptoms of physical distress.  Continue to progress workloads to maintain intensity without signs/symptoms of physical distress.  Continue to progress  workloads to maintain intensity without signs/symptoms of physical distress.     Average METs  2.2  2.2  2.3       Resistance Training   Training Prescription  No  No Relaxation day, no weights.  Yes \     Weight  -  -  2lbs     Reps  -  -  10-15     Time  -  -  10 Minutes       Interval Training   Interval Training  -  No  No       NuStep   Level  1  1  1      SPM  85  85  85     Minutes  10  20  20      METs  1.9  2.3  2.3       Track   Laps  9  7  7      Minutes  20  20  20      METs  1.8  2.23  2.23       Home Exercise Plan   Plans to continue exercise at  [facility] (comment[facility]  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     Frequency  -  Add 1 additional day to program exercise sessions.  Add 1 additional day to program exercise sessions.     Initial Home Exercises Provided  -  02/13/19  02/13/19        Exercise Comments: Exercise Comments    Row Name 01/30/19 1430 02/13/19 1023 02/18/19 1020       Exercise Comments  Pt's first day of exercise. Pt responded well to workloads. Pt did report left shoulder pain, will update staff on if Arm Crank will not work for her. Will continue to monitor and progress as tolerated.   Reviewed home exercise guidelines, METs, and goals with patient.  METs reviewed with patient.        Exercise Goals and Review: Exercise Goals    Row Name 01/22/19 0813             Exercise Goals   Increase Physical Activity  Yes       Intervention  Provide advice, education, support and counseling about physical activity/exercise needs.;Develop an individualized exercise prescription for aerobic and resistive training based on initial evaluation findings, risk stratification, comorbidities and participant's personal goals.       Expected Outcomes  Short Term: Attend rehab on a regular basis to increase amount of physical activity.;Long Term: Add in home exercise to make exercise part of routine and to increase  amount of physical activity.;Long Term: Exercising regularly at least 3-5 days a week.       Increase Strength and Stamina  Yes       Intervention  Provide advice, education, support and counseling about physical activity/exercise needs.;Develop an individualized exercise prescription for aerobic and resistive training based on initial evaluation findings, risk stratification, comorbidities and participant's personal goals.       Expected Outcomes  Short Term: Increase workloads from initial exercise prescription for resistance, speed, and METs.;Short Term: Perform resistance training exercises routinely during rehab and add in resistance training at home;Long Term: Improve cardiorespiratory fitness, muscular endurance and strength as measured by increased METs and functional capacity (6MWT)       Able to understand and use rate of perceived exertion (RPE) scale  Yes       Intervention  Provide education and explanation on how to use RPE scale       Expected Outcomes  Short Term: Able to use RPE daily in rehab to express subjective intensity level;Long Term:  Able to use RPE to guide intensity level when exercising independently       Knowledge and understanding of Target Heart Rate Range (THRR)  Yes       Intervention  Provide education and explanation of THRR including how the numbers were predicted and where they are located for reference       Expected Outcomes  Short Term: Able to state/look up THRR;Long Term: Able to use THRR to govern intensity when exercising  independently;Short Term: Able to use daily as guideline for intensity in rehab       Able to check pulse independently  Yes       Intervention  Provide education and demonstration on how to check pulse in carotid and radial arteries.;Review the importance of being able to check your own pulse for safety during independent exercise       Expected Outcomes  Short Term: Able to explain why pulse checking is important during independent  exercise;Long Term: Able to check pulse independently and accurately       Understanding of Exercise Prescription  Yes       Intervention  Provide education, explanation, and written materials on patient's individual exercise prescription       Expected Outcomes  Short Term: Able to explain program exercise prescription;Long Term: Able to explain home exercise prescription to exercise independently          Exercise Goals Re-Evaluation : Exercise Goals Re-Evaluation    [place] Name 02/13/19 1023             Exercise Goal Re-Evaluation   Exercise Goals Review  Increase Physical Activity;Able to understand and use rate of perceived exertion (RPE) scale;Knowledge and understanding of Target Heart Rate Range (THRR);Understanding of Exercise Prescription;Increase Strength and Stamina       Comments  Reviewed home exercise guidelines with patient including THRR, RPE scale, and endpoints for exercise. 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 Pt plans to return to pool walking at least 1 day/wk in addition to exercise at [facility]. Pt states that she has gained energy since starting th program, and she is able to walk further without stoppping. Pt does have concerns about her balalnce, which she says is not good. She plans to discuss concerns with her cardiologist.        Expected Outcomes  Patient will [facility] at least once a week in addition to exercise at cardiac rehab.          Discharge Exercise Prescription (Final Exercise Prescription Changes): Exercise Prescription Changes - 02/18/19 0957      Response to Exercise   Blood Pressure (Admit)  118/80    Blood Pressure (Exercise)  138/80    Blood Pressure (Exit)  112/82    Heart Rate (Admit)  81 bpm    Heart Rate (Exercise)  93 bpm    Heart Rate (Exit)  76 bpm    Rating of Perceived Exertion (Exercise)  12    Perceived Dyspnea (Exercise)  0    Symptoms  none    Duration  Progress to 30 minutes of  aerobic  without signs/symptoms of physical distress    Intensity  THRR unchanged      Progression   Progression  Continue to progress workloads to maintain intensity without signs/symptoms of physical distress.    Average METs  2.3      Resistance Training   Training Prescription  Yes   \   Weight  2lbs    Reps  10-15    Time  10 Minutes      Interval Training   Interval Training  No      NuStep   Level  1    SPM  85    Minutes  20    METs  2.3      Track   Laps  7    Minutes  20  METs  2.23      Home Exercise Plan   Plans to continue exercise at  [facility] (comment)   [facility]   Frequency  Add 1 additional day to program exercise sessions.    Initial Home Exercises Provided  02/13/19       Nutrition:  Target Goals: Understanding of nutrition guidelines, daily intake of sodium 1500mg , cholesterol 200mg , calories 30% from fat and 7% or less from saturated fats, daily to have 5 or more servings of fruits and vegetables.  Biometrics: Pre Biometrics - 01/22/19 0733      Pre Biometrics   Height  5\' 5"  (1.651 m)    Weight  100.2 kg    Waist Circumference  42 inches    Hip Circumference  50 inches    Waist to Hip Ratio  0.84 %    BMI (Calculated)  36.76    Triceps Skinfold  46 mm    % Body Fat  49.2 %    Grip Strength  24.5 kg    Flexibility  0 in    Single Leg Stand  2.03 seconds        Nutrition Therapy Plan and Nutrition Goals: Nutrition Therapy & Goals - 02/18/19 1044      Nutrition Therapy   Diet  heart healthy      Personal Nutrition Goals   Nutrition Goal  Pt to identify food quantities necessary to achieve weight loss of 6-24 lb at graduation from cardiac rehab.    Personal Goal #2  Pt to build a healthy plate including vegetables, fruits, whole grains, and low-fat dairy products in a heart healthy meal plan.    Personal Goal #3  Pt to weigh and measure serving sizes.    Personal Goal #4  Pt to eat a variety of non-starchy  vegetables.      Intervention Plan   Intervention  Prescribe, educate and counsel regarding individualized specific dietary modifications aiming towards targeted core components such as weight, hypertension, lipid management, diabetes, heart failure and other comorbidities.    Expected Outcomes  Short Term Goal: Understand basic principles of dietary content, such as calories, fat, sodium, cholesterol and nutrients.;Long Term Goal: Adherence to prescribed nutrition plan.       Nutrition Assessments: Nutrition Assessments - 01/22/19 1048      MEDFICTS Scores   Pre Score  20       Nutrition Goals Re-Evaluation:   Nutrition Goals Re-Evaluation:   Nutrition Goals Discharge (Final Nutrition Goals Re-Evaluation):   Psychosocial: Target Goals: Acknowledge presence or absence of significant depression and/or stress, maximize coping skills, provide positive support system. Participant is able to verbalize types and ability to use techniques and skills needed for reducing stress and depression.  Initial Review & Psychosocial Screening: Initial Psych Review & Screening - 01/22/19 1146      Initial Review   Current issues with  None Identified      Family Dynamics   Good Support System?  Yes   Kioni has her daughter family and friends for support     Barriers   Psychosocial barriers to participate in program  There are no identifiable barriers or psychosocial needs.      Screening Interventions   Interventions  Encouraged to exercise       Quality of Life Scores: Quality of Life - 01/22/19 1040      Quality of Life   Select  Quality of Life  Quality of Life Scores   Health/Function Pre  25.57 %    Socioeconomic Pre  30 %    Psych/Spiritual Pre  30 %    Family Pre  27.5 %    GLOBAL Pre  27.74 %      Scores of 19 and below usually indicate a poorer quality of life in these areas.  A difference of  2-3 points is a clinically meaningful difference.  A difference of  2-3 points in the total score of the Quality of Life Index has been associated with significant improvement in overall quality of life, self-image, physical symptoms, and general health in studies assessing change in quality of life.  PHQ-9: Recent Review Flowsheet Data    Depression screen [facility] 2/9 01/30/2019 10/30/2018   Decreased Interest 0 0   Down, Depressed, Hopeless 0 0   PHQ - 2 Score 0 0     Interpretation of Total Score  Total Score Depression Severity:  1-4 = Minimal depression, 5-9 = Mild depression, 10-14 = Moderate depression, 15-19 = Moderately severe depression, 20-27 = Severe depression   Psychosocial Evaluation and Intervention: Psychosocial Evaluation - 01/30/19 1458      Psychosocial Evaluation & Interventions   Interventions  Encouraged to exercise with the program and follow exercise prescription    Comments  No psychosocial interventions necessary.  Jamille enjoys playing cards.    Expected Outcomes  Stephannie will maintain a positive outlook with good coping skills.     Continue Psychosocial Services   No Follow up required       Psychosocial Re-Evaluation: Psychosocial Re-Evaluation    [place] Name 02/28/19 1438             Psychosocial Re-Evaluation   Current issues with  Current Stress Concerns       Interventions  Stress management education;Encouraged to attend Cardiac Rehabilitation for the exercise       Continue Psychosocial Services   No Follow up required         Initial Review   Source of Stress Concerns  Chronic Illness          Psychosocial Discharge (Final Psychosocial Re-Evaluation): Psychosocial Re-Evaluation - 02/28/19 1438      Psychosocial Re-Evaluation   Current issues with  Current Stress Concerns    Interventions  Stress management education;Encouraged to attend Cardiac Rehabilitation for the exercise    Continue Psychosocial Services   No Follow up required      Initial Review   Source of Stress Concerns  Chronic Illness        Vocational Rehabilitation: Provide vocational rehab assistance to qualifying candidates.   Vocational Rehab Evaluation & Intervention: Vocational Rehab - 01/22/19 1149      Initial Vocational Rehab Evaluation & Intervention   Assessment shows need for Vocational Rehabilitation  No   Marvie is retired and does not need vocational rehab at this time      Education: Education Goals: Education classes will be provided on a weekly basis, covering required topics. Participant will state understanding/return demonstration of topics presented.  Learning Barriers/Preferences: Learning Barriers/Preferences - 01/22/19 1120      Learning Barriers/Preferences   Learning Barriers  None    Learning Preferences  Written Material;Video;Pictoral       Education Topics: Count Your Pulse:  -Group instruction provided by verbal instruction, demonstration, patient participation and written materials to support subject.  Instructors address importance of being able to find your pulse and how to count your pulse when  at home without a heart monitor.  Patients get hands on experience counting their pulse with staff help and individually.   Heart Attack, Angina, and Risk Factor Modification:  -Group instruction provided by verbal instruction, video, and written materials to support subject.  Instructors address signs and symptoms of angina and heart attacks.    Also discuss risk factors for heart disease and how to make changes to improve heart health risk factors.   Functional Fitness:  -Group instruction provided by verbal instruction, demonstration, patient participation, and written materials to support subject.  Instructors address safety measures for doing things around the house.  Discuss how to get up and down off the floor, how to pick things up properly, how to safely get out of a chair without assistance, and balance training.   Meditation and Mindfulness:  -Group instruction provided  by verbal instruction, patient participation, and written materials to support subject.  Instructor addresses importance of mindfulness and meditation practice to help reduce stress and improve awareness.  Instructor also leads participants through a meditation exercise.    Stretching for Flexibility and Mobility:  -Group instruction provided by verbal instruction, patient participation, and written materials to support subject.  Instructors lead participants through series of stretches that are designed to increase flexibility thus improving mobility.  These stretches are additional exercise for major muscle groups that are typically performed during regular warm up and cool down.   Hands Only CPR:  -Group verbal, video, and participation provides a basic overview of AHA guidelines for community CPR. Role-play of emergencies allow participants the opportunity to practice calling for help and chest compression technique with discussion of AED use.   Hypertension: -Group verbal and written instruction that provides a basic overview of hypertension including the most recent diagnostic guidelines, risk factor reduction with self-care instructions and medication management.    Nutrition I class: Heart Healthy Eating:  -Group instruction provided by PowerPoint slides, verbal discussion, and written materials to support subject matter. The instructor gives an explanation and review of the Therapeutic Lifestyle Changes diet recommendations, which includes a discussion on lipid goals, dietary fat, sodium, fiber, plant stanol/sterol esters, sugar, and the components of a well-balanced, healthy diet.   Nutrition II class: Lifestyle Skills:  -Group instruction provided by PowerPoint slides, verbal discussion, and written materials to support subject matter. The instructor gives an explanation and review of label reading, grocery shopping for heart health, heart healthy recipe modifications, and ways to  make healthier choices when eating out.   Diabetes Question & Answer:  -Group instruction provided by PowerPoint slides, verbal discussion, and written materials to support subject matter. The instructor gives an explanation and review of diabetes co-morbidities, pre- and post-prandial blood glucose goals, pre-exercise blood glucose goals, signs, symptoms, and treatment of hypoglycemia and hyperglycemia, and foot care basics.   Diabetes Blitz:  -Group instruction provided by PowerPoint slides, verbal discussion, and written materials to support subject matter. The instructor gives an explanation and review of the physiology behind type 1 and type 2 diabetes, diabetes medications and rational behind using different medications, pre- and post-prandial blood glucose recommendations and Hemoglobin A1c goals, diabetes diet, and exercise including blood glucose guidelines for exercising safely.    Portion Distortion:  -Group instruction provided by PowerPoint slides, verbal discussion, written materials, and food models to support subject matter. The instructor gives an explanation of serving size versus portion size, changes in portions sizes over the last 20 years, and what consists of a serving from each  food group.   Stress Management:  -Group instruction provided by verbal instruction, video, and written materials to support subject matter.  Instructors review role of stress in heart disease and how to cope with stress positively.     Exercising on Your Own:  -Group instruction provided by verbal instruction, power point, and written materials to support subject.  Instructors discuss benefits of exercise, components of exercise, frequency and intensity of exercise, and end points for exercise.  Also discuss use of nitroglycerin and activating EMS.  Review options of places to exercise outside of rehab.  Review guidelines for sex with heart disease.   Cardiac Drugs I:  -Group instruction  provided by verbal instruction and written materials to support subject.  Instructor reviews cardiac drug classes: antiplatelets, anticoagulants, beta blockers, and statins.  Instructor discusses reasons, side effects, and lifestyle considerations for each drug class.   Cardiac Drugs II:  -Group instruction provided by verbal instruction and written materials to support subject.  Instructor reviews cardiac drug classes: angiotensin converting enzyme inhibitors (ACE-I), angiotensin II receptor blockers (ARBs), nitrates, and calcium channel blockers.  Instructor discusses reasons, side effects, and lifestyle considerations for each drug class.   CARDIAC REHAB PHASE II EXERCISE from 02/06/2019 in [facility]  Date  02/06/19  Instruction Review Code  2- Demonstrated Understanding      Anatomy and Physiology of the Circulatory System:  Group verbal and written instruction and models provide basic cardiac anatomy and physiology, with the coronary electrical and arterial systems. Review of: AMI, Angina, Valve disease, Heart Failure, Peripheral Artery Disease, Cardiac Arrhythmia, Pacemakers, and the ICD.   CARDIAC REHAB PHASE II EXERCISE from 02/06/2019 in [facility]  Date  01/30/19  Educator  RN  Instruction Review Code  2- Demonstrated Understanding      Other Education:  -Group or individual verbal, written, or video instructions that support the educational goals of the cardiac rehab program.   Holiday Eating Survival Tips:  -Group instruction provided by PowerPoint slides, verbal discussion, and written materials to support subject matter. The instructor gives patients tips, tricks, and techniques to help them not only survive but enjoy the holidays despite the onslaught of food that accompanies the holidays.   Knowledge Questionnaire Score: Knowledge Questionnaire Score - 01/22/19 1042      Knowledge Questionnaire Score   Pre  Score  20/24       Core Components/Risk Factors/Patient Goals at Admission: Personal Goals and Risk Factors at Admission - 01/22/19 1112      Core Components/Risk Factors/Patient Goals on Admission    Weight Management  Yes;Obesity    Intervention  Weight Management/Obesity: Establish reasonable short term and long term weight goals.;Obesity: Provide education and appropriate resources to help participant work on and attain dietary goals.    Admit Weight  220 lb 14.4 oz (100.2 kg)    Expected Outcomes  Short Term: Continue to assess and modify interventions until short term weight is achieved;Long Term: Adherence to nutrition and physical activity/exercise program aimed toward attainment of established weight goal;Weight Loss: Understanding of general recommendations for a balanced deficit meal plan, which promotes 1-2 lb weight loss per week and includes a negative energy balance of [phone] kcal/d;Understanding recommendations for meals to include 15-35% energy as protein, 25-35% energy from fat, 35-60% energy from carbohydrates, less than 200mg  of dietary cholesterol, 20-35 gm of total fiber daily;Understanding of distribution of calorie intake throughout the day with the consumption of 4-5  meals/snacks    Hypertension  Yes    Intervention  Provide education on lifestyle modifcations including regular physical activity/exercise, weight management, moderate sodium restriction and increased consumption of fresh fruit, vegetables, and low fat dairy, alcohol moderation, and smoking cessation.;Monitor prescription use compliance.    Expected Outcomes  Short Term: Continued assessment and intervention until BP is < 140/47mm HG in hypertensive participants. < 130/38mm HG in hypertensive participants with diabetes, heart failure or chronic kidney disease.;Long Term: Maintenance of blood pressure at goal levels.    Lipids  Yes    Intervention  Provide education and support for participant on nutrition &  aerobic/resistive exercise along with prescribed medications to achieve LDL 70mg , HDL >40mg .    Expected Outcomes  Short Term: Participant states understanding of desired cholesterol values and is compliant with medications prescribed. Participant is following exercise prescription and nutrition guidelines.;Long Term: Cholesterol controlled with medications as prescribed, with individualized exercise RX and with personalized nutrition plan. Value goals: LDL < 70mg , HDL > 40 mg.    Personal Goal Other  Yes    Personal Goal  Patient would like to increase strength in thighs and be able to walk longer without stopping.    Intervention  Provide individualized exercise prescription including aerobic and resistance training to help build strength and stamina. Provide home exercise guidelines for patient to walk at home in addition to exercise at cardiac rehab to help establish a walking routine that patient can continue upon completion of the program.     Expected Outcomes  Patient will be able to walk longer duration without stopping.       Core Components/Risk Factors/Patient Goals Review:  Goals and Risk Factor Review    Row Name 01/30/19 1515 02/28/19 1439           Core Components/Risk Factors/Patient Goals Review   Personal Goals Review  Weight Management/Obesity;Lipids;Hypertension  Weight Management/Obesity;Lipids;Hypertension      Review  Pt with multiple CAD RFs willing to participate in CR exercise.  Danyale would like to improve her walking and increase the strength in her thighs.   Pt with multiple CAD RFs willing to participate in CR exercise.  Teairra says she cant says she enjoys coming to cardiac rehab but she will keep coming to get stronger      Expected Outcomes  Pt will continue to participate in CR exercise, nutrition, and lifestyle modification opportunities.   Pt will continue to participate in CR exercise, nutrition, and lifestyle modification opportunities.          Core  Components/Risk Factors/Patient Goals at Discharge (Final Review):  Goals and Risk Factor Review - 02/28/19 1439      Core Components/Risk Factors/Patient Goals Review   Personal Goals Review  Weight Management/Obesity;Lipids;Hypertension    Review  Pt with multiple CAD RFs willing to participate in CR exercise.  Gracin says she cant says she enjoys coming to cardiac rehab but she will keep coming to get stronger    Expected Outcomes  Pt will continue to participate in CR exercise, nutrition, and lifestyle modification opportunities.        ITP Comments: ITP Comments    Row Name 01/22/19 0906 01/30/19 1438 02/28/19 1434       ITP Comments  Dr. Fransico Him, Medical Director   30 Day ITP Review. Pt started exercise today and tolerated it well.   30 Day ITP Review. [place] has fair attendance and participation in phase 2 cardiac rehab  Comments: See ITP comments.Barnet Pall, RN,BSN 02/28/2019 2:44 PM

## 2019-03-01 ENCOUNTER — Encounter (HOSPITAL_COMMUNITY): Payer: PPO

## 2019-03-01 DIAGNOSIS — I159 Secondary hypertension, unspecified: Secondary | ICD-10-CM | POA: Diagnosis not present

## 2019-03-04 ENCOUNTER — Encounter (HOSPITAL_COMMUNITY): Payer: PPO

## 2019-03-04 ENCOUNTER — Encounter (HOSPITAL_COMMUNITY)
Admission: RE | Admit: 2019-03-04 | Discharge: 2019-03-04 | Disposition: A | Discharge status: Home / Self Care | Payer: PPO | Source: Ambulatory Visit | Attending: Cardiovascular Disease | Admitting: Cardiovascular Disease

## 2019-03-04 ENCOUNTER — Ambulatory Visit (HOSPITAL_COMMUNITY): Payer: PPO

## 2019-03-04 DIAGNOSIS — Z952 Presence of prosthetic heart valve: Secondary | ICD-10-CM

## 2019-03-04 DIAGNOSIS — Z951 Presence of aortocoronary bypass graft: Secondary | ICD-10-CM

## 2019-03-05 ENCOUNTER — Other Ambulatory Visit: Payer: PPO

## 2019-03-06 ENCOUNTER — Encounter (HOSPITAL_COMMUNITY)
Admission: RE | Admit: 2019-03-06 | Discharge: 2019-03-06 | Disposition: A | Discharge status: Home / Self Care | Payer: PPO | Source: Ambulatory Visit | Attending: Cardiovascular Disease | Admitting: Cardiovascular Disease

## 2019-03-06 ENCOUNTER — Ambulatory Visit (HOSPITAL_COMMUNITY): Payer: PPO

## 2019-03-06 ENCOUNTER — Encounter (HOSPITAL_COMMUNITY): Payer: PPO

## 2019-03-06 ENCOUNTER — Other Ambulatory Visit: Payer: Self-pay

## 2019-03-06 VITALS — BP 140/68 | HR 70 | Wt 222.7 lb

## 2019-03-06 DIAGNOSIS — Z952 Presence of prosthetic heart valve: Secondary | ICD-10-CM

## 2019-03-06 DIAGNOSIS — Z951 Presence of aortocoronary bypass graft: Secondary | ICD-10-CM | POA: Diagnosis not present

## 2019-03-08 ENCOUNTER — Encounter (HOSPITAL_COMMUNITY): Payer: PPO

## 2019-03-11 ENCOUNTER — Encounter (HOSPITAL_COMMUNITY): Payer: PPO

## 2019-03-11 ENCOUNTER — Telehealth (HOSPITAL_COMMUNITY): Payer: Self-pay | Admitting: Cardiac Rehabilitation

## 2019-03-11 ENCOUNTER — Telehealth (HOSPITAL_COMMUNITY): Payer: Self-pay | Admitting: *Deleted

## 2019-03-11 ENCOUNTER — Ambulatory Visit (HOSPITAL_COMMUNITY): Payer: PPO

## 2019-03-11 ENCOUNTER — Telehealth (HOSPITAL_COMMUNITY): Payer: Self-pay | Admitting: Internal Medicine

## 2019-03-13 ENCOUNTER — Encounter (HOSPITAL_COMMUNITY): Payer: PPO

## 2019-03-13 ENCOUNTER — Ambulatory Visit (HOSPITAL_COMMUNITY): Payer: PPO

## 2019-03-14 ENCOUNTER — Encounter (HOSPITAL_COMMUNITY): Payer: Self-pay | Admitting: *Deleted

## 2019-03-14 DIAGNOSIS — Z952 Presence of prosthetic heart valve: Secondary | ICD-10-CM

## 2019-03-14 DIAGNOSIS — Z951 Presence of aortocoronary bypass graft: Secondary | ICD-10-CM

## 2019-03-15 ENCOUNTER — Encounter (HOSPITAL_COMMUNITY): Payer: PPO

## 2019-03-18 ENCOUNTER — Ambulatory Visit (HOSPITAL_COMMUNITY): Payer: PPO

## 2019-03-18 ENCOUNTER — Telehealth (HOSPITAL_COMMUNITY): Payer: Self-pay | Admitting: *Deleted

## 2019-03-18 ENCOUNTER — Encounter (HOSPITAL_COMMUNITY): Payer: Self-pay | Admitting: *Deleted

## 2019-03-18 ENCOUNTER — Encounter (HOSPITAL_COMMUNITY): Payer: PPO

## 2019-03-18 DIAGNOSIS — Z952 Presence of prosthetic heart valve: Secondary | ICD-10-CM

## 2019-03-18 DIAGNOSIS — Z951 Presence of aortocoronary bypass graft: Secondary | ICD-10-CM

## 2019-03-18 NOTE — Progress Notes (Signed)
Cardiac Individual Treatment Plan  Patient Details  Name: [name] [name] MRN: [id number] Date of Birth: December 23, 1946 Referring Provider:     [facility] from 01/22/2019 in [place]  Referring Provider  Quay Burow, MD      Initial Encounter Date:    CARDIAC REHAB PHASE II ORIENTATION from 01/22/2019 in [place]  Date  01/22/19      Visit Diagnosis: 09/24/18 CABG x2  09/24/18 AVR  Patient's Home Medications on Admission:  Current Outpatient Medications:  .  acetaminophen (TYLENOL) 650 MG CR tablet, Take 1,300 mg by mouth every 8 (eight) hours as needed for pain., Disp: , Rfl:  .  allopurinol (ZYLOPRIM) 100 MG tablet, Take 1 tablet (100 mg total) by mouth daily., Disp: 30 tablet, Rfl: 0 .  aspirin 81 MG EC tablet, Take 1 tablet (81 mg total) by mouth daily., Disp: , Rfl:  .  cephALEXin (KEFLEX) 500 MG capsule, Take all 4 tablets one hour prior to dental procedure, Disp: 4 capsule, Rfl: 6 .  Cholecalciferol (VITAMIN D) 2000 units tablet, Take 1 tablet by mouth daily., Disp: , Rfl:  .  dicyclomine (BENTYL) 10 MG capsule, Take 10 mg by mouth 4 (four) times daily as needed for spasms. , Disp: , Rfl:  .  Evolocumab (REPATHA SURECLICK) 650 MG/ML SOAJ, Inject 140 mg into the skin every 14 (fourteen) days., Disp: 2 pen, Rfl: 12 .  hydrochlorothiazide (MICROZIDE) 12.5 MG capsule, Take 1 capsule (12.5 mg total) by mouth daily., Disp: 90 capsule, Rfl: 3 .  lansoprazole (PREVACID) 30 MG capsule, Take 30 mg by mouth daily as needed (heartburn). , Disp: , Rfl:  .  levothyroxine (SYNTHROID, LEVOTHROID) 125 MCG tablet, Take 100 mcg by mouth daily before breakfast. , Disp: , Rfl:  .  losartan (COZAAR) 50 MG tablet, Take 1 tablet (50 mg total) by mouth daily., Disp: 90 tablet, Rfl: 3 .  meclizine (ANTIVERT) 25 MG tablet, Take 25 mg by mouth 3 (three) times daily as needed for dizziness. , Disp: , Rfl:  .   metoprolol tartrate (LOPRESSOR) 25 MG tablet, Take 1 tablet (25 mg total) by mouth 2 (two) times daily., Disp: 180 tablet, Rfl: 3 .  Propylene Glycol (SYSTANE BALANCE) 0.6 % SOLN, Place 1 drop into both eyes 2 (two) times daily as needed (dry eyes)., Disp: , Rfl:  .  traMADol (ULTRAM) 50 MG tablet, Take 50 mg by mouth every 4-6 hours PRN severe pain, Disp: 30 tablet, Rfl: 0 .  trimethoprim (TRIMPEX) 100 MG tablet, Take 100 mg by mouth daily as needed (For UTI Prevention). , Disp: , Rfl:   Past Medical History: Past Medical History:  Diagnosis Date  . Aortic stenosis, severe 08/2018   progressed from moderate by ECHO 01-03-17   . Arthritis   . Cancer ([place])    basal cell skin cancer  . Carotid artery bruit   . Claustrophobia   . Coronary artery disease   . Diverticulosis   . Family history of heart disease   . GERD (gastroesophageal reflux disease)   . Gout   . History of hiatal hernia   . History of kidney stones   . Hyperlipemia   . Hypertension   . Irritable bowel syndrome   . Thyroid disease   . UTI (urinary tract infection) 03/09/14    Tobacco Use: Social History   Tobacco Use  Smoking Status Former Smoker  . Packs/day: 0.80  .  Years: 20.00  . Pack years: 16.00  . Types: Cigarettes  . Last attempt to quit: 09/21/1987  . Years since quitting: 31.5  Smokeless Tobacco Never Used  Tobacco Comment   socially    Labs: Recent Review Flowsheet Data    Labs for ITP Cardiac and Pulmonary Rehab Latest Ref Rng & Units 09/24/2018 09/24/2018 09/24/2018 09/25/2018 02/14/2019   Cholestrol 100 - 199 mg/dL - - - - 159   LDLCALC 0 - 99 mg/dL - - - - 72   HDL >39 mg/dL - - - - 45   Trlycerides 0 - 149 mg/dL - - - - 212(H)   Hemoglobin A1c 4.8 - 5.6 % - - - - -   PHART 7.350 - 7.450 7.374 7.373 - - -   PCO2ART 32.0 - 48.0 mmHg 41.7 38.8 - - -   HCO3 20.0 - 28.0 mmol/L 24.4 22.7 - - -   TCO2 22 - 32 mmol/L 26 24 25 25  -   ACIDBASEDEF 0.0 - 2.0 mmol/L 1.0 2.0 - - -   O2SAT % 99.0 97.0 -  - -      Capillary Blood Glucose: Lab Results  Component Value Date   GLUCAP 116 (H) 09/30/2018   GLUCAP 117 (H) 09/29/2018   GLUCAP 94 09/29/2018   GLUCAP 100 (H) 09/29/2018   GLUCAP 117 (H) 09/29/2018     Exercise Target Goals: Exercise Program Goal: Individual exercise prescription set using results from initial 6 min walk test and THRR while considering  patient's activity barriers and safety.   Exercise Prescription Goal: Initial exercise prescription builds to 30-45 minutes a day of aerobic activity, 2-3 days per week.  Home exercise guidelines will be given to patient during program as part of exercise prescription that the participant will acknowledge.  Activity Barriers & Risk Stratification: Activity Barriers & Cardiac Risk Stratification - 01/22/19 0812      Activity Barriers & Cardiac Risk Stratification   Activity Barriers  Arthritis;Back Problems;Right Knee Replacement;Other (comment)    Comments  Left knee discomfort, bursitis.    Cardiac Risk Stratification  High       6 Minute Walk: 6 Minute Walk    Row Name 01/22/19 0804         6 Minute Walk   Phase  Initial     Distance  740 feet     Walk Time  6 minutes     # of Rest Breaks  0     MPH  1.4     METS  1.29     RPE  12     Perceived Dyspnea   0     VO2 Peak  4.51     Symptoms  No     Resting HR  84 bpm     Resting BP  130/84     Resting Oxygen Saturation   95 %     Exercise Oxygen Saturation  during 6 min walk  96 %     Max Ex. HR  107 bpm     Max Ex. BP  138/82     2 Minute Post BP  120/80        Oxygen Initial Assessment:   Oxygen Re-Evaluation:   Oxygen Discharge (Final Oxygen Re-Evaluation):   Initial Exercise Prescription: Initial Exercise Prescription - 01/22/19 1100      Date of Initial Exercise RX and Referring Provider   Date  01/22/19    Referring Provider  Quay Burow, MD  Expected Discharge Date  04/29/19      NuStep   Level  1    SPM  85    Minutes  10     METs  1.8      Arm Ergometer   Level  1    Watts  15    Minutes  10    METs  1.79      Track   Laps  5    Minutes  10    METs  1.84      Prescription Details   Frequency (times per week)  2    Duration  Progress to 30 minutes of continuous aerobic without signs/symptoms of physical distress      Intensity   THRR 40-80% of Max Heartrate  58-117    Ratings of Perceived Exertion  11-13    Perceived Dyspnea  0-4      Progression   Progression  Continue to progress workloads to maintain intensity without signs/symptoms of physical distress.      Resistance Training   Training Prescription  Yes    Weight  2lbs    Reps  10-15       Perform Capillary Blood Glucose checks as needed.  Exercise Prescription Changes: Exercise Prescription Changes    Row Name 01/30/19 1400 02/13/19 0955 02/18/19 0957 03/04/19 0955 03/06/19 0957     Response to Exercise   Blood Pressure (Admit)  120/60  124/80  118/80  122/82  140/68   Blood Pressure (Exercise)  136/74  122/78  138/80  142/88  138/88   Blood Pressure (Exit)  112/68  112/70  112/82  114/80  126/80   Heart Rate (Admit)  71 bpm  79 bpm  81 bpm  87 bpm  70 bpm   Heart Rate (Exercise)  97 bpm  92 bpm  93 bpm  107 bpm  95 bpm   Heart Rate (Exit)  69 bpm  71 bpm  76 bpm  87 bpm  69 bpm   Rating of Perceived Exertion (Exercise)  13  12  12  10  15    Perceived Dyspnea (Exercise)  0  0  0  0  0   Symptoms  Left Shoulder Pain  -  none  Hip pain  Hip pain   Comments  Pt oriented to exercise equipment  -  -  Pt used arm ergometer instead of Nustep b/c of hip pain.  Pt used arm ergometer instead of Nustep b/c of hip pain.   Duration  Progress to 30 minutes of  aerobic without signs/symptoms of physical distress  Progress to 30 minutes of  aerobic without signs/symptoms of physical distress  Progress to 30 minutes of  aerobic without signs/symptoms of physical distress  Progress to 30 minutes of  aerobic without signs/symptoms of physical  distress  Progress to 30 minutes of  aerobic without signs/symptoms of physical distress   Intensity  THRR unchanged  THRR unchanged  THRR unchanged  THRR unchanged  THRR unchanged     Progression   Progression  Continue to progress workloads to maintain intensity without signs/symptoms of physical distress.  Continue to progress workloads to maintain intensity without signs/symptoms of physical distress.  Continue to progress workloads to maintain intensity without signs/symptoms of physical distress.  Continue to progress workloads to maintain intensity without signs/symptoms of physical distress.  Continue to progress workloads to maintain intensity without signs/symptoms of physical distress.   Average METs  2.2  2.2  2.3  2  2.1     Resistance Training   Training Prescription  No  No Relaxation day, no weights.  Yes \  Yes  No Relaxation day, no weights.   Weight  -  -  2lbs  2lbs  -   Reps  -  -  10-15  10-15  -   Time  -  -  10 Minutes  10 Minutes  -     Interval Training   Interval Training  -  No  No  No  No     NuStep   Level  1  1  1   -  -   SPM  85  85  85  -  -   Minutes  10  20  20   -  -   METs  1.9  2.3  2.3  -  -     Arm Ergometer   Level  -  -  -  1  1   Minutes  -  -  -  10  10     Track   Laps  9  7  7  6  11    Minutes  20  20  20  20  17    METs  1.8  2.23  2.23  2.03  2.13     Home Exercise Plan   Plans to continue exercise at  [facility] (comment[facility]  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   Frequency  -  Add 1 additional day to program exercise sessions.  Add 1 additional day to program exercise sessions.  Add 1 additional day to program exercise sessions.  Add 1 additional day to program exercise sessions.   Initial Home Exercises Provided  -  02/13/19  02/13/19  02/13/19  02/13/19      Exercise Comments: Exercise  Comments    Row Name 01/30/19 1430 02/13/19 1023 02/18/19 1020 03/04/19 1015     Exercise Comments  Pt's first day of exercise. Pt responded well to workloads. Pt did report left shoulder pain, will update staff on if Arm Crank will not work for her. Will continue to monitor and progress as tolerated.   Reviewed home exercise guidelines, METs, and goals with patient.  METs reviewed with patient.  Reviewed METs and goals with patient.       Exercise Goals and Review: Exercise Goals    Row Name 01/22/19 0813             Exercise Goals   Increase Physical Activity  Yes       Intervention  Provide advice, education, support and counseling about physical activity/exercise needs.;Develop an individualized exercise prescription for aerobic and resistive training based on initial evaluation findings, risk stratification, comorbidities and participant's personal goals.       Expected Outcomes  Short Term: Attend rehab on a regular basis to increase amount of physical activity.;Long Term: Add in home exercise to make exercise part of routine and to increase amount of physical activity.;Long Term: Exercising regularly at least 3-5 days a week.       Increase Strength and Stamina  Yes       Intervention  Provide advice, education, support and counseling about physical activity/exercise needs.;Develop an individualized exercise prescription for aerobic and resistive training based on initial evaluation findings, risk  stratification, comorbidities and participant's personal goals.       Expected Outcomes  Short Term: Increase workloads from initial exercise prescription for resistance, speed, and METs.;Short Term: Perform resistance training exercises routinely during rehab and add in resistance training at home;Long Term: Improve cardiorespiratory fitness, muscular endurance and strength as measured by increased METs and functional capacity (6MWT)       Able to understand and use rate of perceived exertion  (RPE) scale  Yes       Intervention  Provide education and explanation on how to use RPE scale       Expected Outcomes  Short Term: Able to use RPE daily in rehab to express subjective intensity level;Long Term:  Able to use RPE to guide intensity level when exercising independently       Knowledge and understanding of Target Heart Rate Range (THRR)  Yes       Intervention  Provide education and explanation of THRR including how the numbers were predicted and where they are located for reference       Expected Outcomes  Short Term: Able to state/look up THRR;Long Term: Able to use THRR to govern intensity when exercising independently;Short Term: Able to use daily as guideline for intensity in rehab       Able to check pulse independently  Yes       Intervention  Provide education and demonstration on how to check pulse in carotid and radial arteries.;Review the importance of being able to check your own pulse for safety during independent exercise       Expected Outcomes  Short Term: Able to explain why pulse checking is important during independent exercise;Long Term: Able to check pulse independently and accurately       Understanding of Exercise Prescription  Yes       Intervention  Provide education, explanation, and written materials on patient's individual exercise prescription       Expected Outcomes  Short Term: Able to explain program exercise prescription;Long Term: Able to explain home exercise prescription to exercise independently          Exercise Goals Re-Evaluation : Exercise Goals Re-Evaluation    Row Name 02/13/19 1023 03/04/19 1015 03/13/19 1056         Exercise Goal Re-Evaluation   Exercise Goals Review  Increase Physical Activity;Able to understand and use rate of perceived exertion (RPE) scale;Knowledge and understanding of Target Heart Rate Range (THRR);Understanding of Exercise Prescription;Increase Strength and Stamina  Increase Physical Activity;Able to understand and  use rate of perceived exertion (RPE) scale;Knowledge and understanding of Target Heart Rate Range (THRR);Understanding of Exercise Prescription;Increase Strength and Stamina  -     Comments  Reviewed home exercise guidelines with patient including THRR, RPE scale, and endpoints for exercise. 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 Pt plans to return to pool walking at least 1 day/wk in addition to exercise at [facility]. Pt states that she has gained energy since starting th program, and she is able to walk further without stoppping. Pt does have concerns about her balalnce, which she says is not good. She plans to discuss concerns with her cardiologist.   Patient is walking 1-2 days/week at home, 10 minutes, 2x's/day. Exercise is limited by bursitis in both hips. Pt uses a walking stick when she walks at home and a rollator at [facility]. Today patient's hips were bothering her, so she used the arm ergometer for 20 minutes instead of the  recumbent stepper and walked for 10 minutes as usual.  Temporary department closure due to COVID-19.     Expected Outcomes  [facility] will [facility] at least once a week in addition to exercise at cardiac rehab.  Patient will walk 1-2 days/week at home, as tolerated to help improve stamina.  -        Discharge Exercise Prescription (Final Exercise Prescription Changes): Exercise Prescription Changes - 03/06/19 0957      Response to Exercise   Blood Pressure (Admit)  140/68    Blood Pressure (Exercise)  138/88    Blood Pressure (Exit)  126/80    Heart Rate (Admit)  70 bpm    Heart Rate (Exercise)  95 bpm    Heart Rate (Exit)  69 bpm    Rating of Perceived Exertion (Exercise)  15    Perceived Dyspnea (Exercise)  0    Symptoms  Hip pain    Comments  Pt used arm ergometer instead of Nustep b/c of hip pain.    Duration  Progress to 30 minutes of  aerobic without signs/symptoms of physical distress    Intensity  THRR unchanged      Progression    Progression  Continue to progress workloads to maintain intensity without signs/symptoms of physical distress.    Average METs  2.1      Resistance Training   Training Prescription  No   Relaxation day, no weights.     Interval Training   Interval Training  No      Arm Ergometer   Level  1    Minutes  10      Track   Laps  11    Minutes  17    METs  2.13      Home Exercise Plan   Plans to continue exercise at  [employer] (comment)   [facility].   Frequency  Add 1 additional day to program exercise sessions.    Initial Home Exercises Provided  02/13/19       Nutrition:  Target Goals: Understanding of nutrition guidelines, daily intake of sodium 1500mg , cholesterol 200mg , calories 30% from fat and 7% or less from saturated fats, daily to have 5 or more servings of fruits and vegetables.  Biometrics: Pre Biometrics - 01/22/19 0733      Pre Biometrics   Height  5\' 5"  (1.651 m)    Weight  220 lb 14.4 oz (100.2 kg)    Waist Circumference  42 inches    Hip Circumference  50 inches    Waist to Hip Ratio  0.84 %    BMI (Calculated)  36.76    Triceps Skinfold  46 mm    % Body Fat  49.2 %    Grip Strength  24.5 kg    Flexibility  0 in    Single Leg Stand  2.03 seconds        Nutrition Therapy Plan and Nutrition Goals: Nutrition Therapy & Goals - 02/18/19 1044      Nutrition Therapy   Diet  heart healthy      Personal Nutrition Goals   Nutrition Goal  Pt to identify food quantities necessary to achieve weight loss of 6-24 lb at graduation from cardiac rehab.    Personal Goal #2  Pt to build a healthy plate including vegetables, fruits, whole grains, and low-fat dairy products in a heart healthy meal plan.    Personal Goal #3  Pt to weigh and measure serving sizes.    Personal Goal #4  Pt to eat a variety of non-starchy vegetables.      Intervention Plan   Intervention  Prescribe, educate and counsel regarding individualized specific dietary  modifications aiming towards targeted core components such as weight, hypertension, lipid management, diabetes, heart failure and other comorbidities.    Expected Outcomes  Short Term Goal: Understand basic principles of dietary content, such as calories, fat, sodium, cholesterol and nutrients.;Long Term Goal: Adherence to prescribed nutrition plan.       Nutrition Assessments: Nutrition Assessments - 01/22/19 1048      MEDFICTS Scores   Pre Score  20       Nutrition Goals Re-Evaluation:   Nutrition Goals Re-Evaluation:   Nutrition Goals Discharge (Final Nutrition Goals Re-Evaluation):   Psychosocial: Target Goals: Acknowledge presence or absence of significant depression and/or stress, maximize coping skills, provide positive support system. Participant is able to verbalize types and ability to use techniques and skills needed for reducing stress and depression.  Initial Review & Psychosocial Screening: Initial Psych Review & Screening - 01/22/19 1146      Initial Review   Current issues with  None Identified      Family Dynamics   Good Support System?  Yes   [name] [name] has her daughter family and friends for support     Barriers   Psychosocial barriers to participate in program  There are no identifiable barriers or psychosocial needs.      Screening Interventions   Interventions  Encouraged to exercise       Quality of Life Scores: Quality of Life - 01/22/19 1040      Quality of Life   Select  Quality of Life      Quality of Life Scores   Health/Function Pre  25.57 %    Socioeconomic Pre  30 %    Psych/Spiritual Pre  30 %    Family Pre  27.5 %    GLOBAL Pre  27.74 %      Scores of 19 and below usually indicate a poorer quality of life in these areas.  A difference of  2-3 points is a clinically meaningful difference.  A difference of 2-3 points in the total score of the Quality of Life Index has been associated with significant improvement in overall quality  of life, self-image, physical symptoms, and general health in studies assessing change in quality of life.  PHQ-9: Recent Review Flowsheet Data    Depression screen [facility] 2/9 01/30/2019 10/30/2018   Decreased Interest 0 0   Down, Depressed, Hopeless 0 0   PHQ - 2 Score 0 0     Interpretation of Total Score  Total Score Depression Severity:  1-4 = Minimal depression, 5-9 = Mild depression, 10-14 = Moderate depression, 15-19 = Moderately severe depression, 20-27 = Severe depression   Psychosocial Evaluation and Intervention: Psychosocial Evaluation - 01/30/19 1458      Psychosocial Evaluation & Interventions   Interventions  Encouraged to exercise with the program and follow exercise prescription    Comments  No psychosocial interventions necessary.  [name] [name] enjoys playing cards.    Expected Outcomes  [name] [name] will maintain a positive outlook with good coping skills.     Continue Psychosocial Services   No Follow up required       Psychosocial Re-Evaluation: Psychosocial Re-Evaluation    [place] Name 02/28/19 1438 03/14/19 1120           Psychosocial  Re-Evaluation   Current issues with  Current Stress Concerns  Current Stress Concerns      Comments  -  Exercise at cardiac rehab is currently on hold per recommended guidelines to prevent the spread of COVID-19      Interventions  Stress management education;Encouraged to attend Cardiac Rehabilitation for the exercise  -      Continue Psychosocial Services   No Follow up required  -        Initial Review   Source of Stress Concerns  Chronic Illness  -         Psychosocial Discharge (Final Psychosocial Re-Evaluation): Psychosocial Re-Evaluation - 03/14/19 1120      Psychosocial Re-Evaluation   Current issues with  Current Stress Concerns    Comments  Exercise at cardiac rehab is currently on hold per recommended guidelines to prevent the spread of COVID-19       Vocational Rehabilitation: Provide vocational rehab assistance to  qualifying candidates.   Vocational Rehab Evaluation & Intervention: Vocational Rehab - 01/22/19 1149      Initial Vocational Rehab Evaluation & Intervention   Assessment shows need for Vocational Rehabilitation  No   [name] [name] is retired and does not need vocational rehab at this time      Education: Education Goals: Education classes will be provided on a weekly basis, covering required topics. Participant will state understanding/return demonstration of topics presented.  Learning Barriers/Preferences: Learning Barriers/Preferences - 01/22/19 1120      Learning Barriers/Preferences   Learning Barriers  None    Learning Preferences  Written Material;Video;Pictoral       Education Topics: Count Your Pulse:  -Group instruction provided by verbal instruction, demonstration, patient participation and written materials to support subject.  Instructors address importance of being able to find your pulse and how to count your pulse when at home without a heart monitor.  Patients get hands on experience counting their pulse with staff help and individually.   Heart Attack, Angina, and Risk Factor Modification:  -Group instruction provided by verbal instruction, video, and written materials to support subject.  Instructors address signs and symptoms of angina and heart attacks.    Also discuss risk factors for heart disease and how to make changes to improve heart health risk factors.   Functional Fitness:  -Group instruction provided by verbal instruction, demonstration, patient participation, and written materials to support subject.  Instructors address safety measures for doing things around the house.  Discuss how to get up and down off the floor, how to pick things up properly, how to safely get out of a chair without assistance, and balance training.   Meditation and Mindfulness:  -Group instruction provided by verbal instruction, patient participation, and written materials to  support subject.  Instructor addresses importance of mindfulness and meditation practice to help reduce stress and improve awareness.  Instructor also leads participants through a meditation exercise.    Stretching for Flexibility and Mobility:  -Group instruction provided by verbal instruction, patient participation, and written materials to support subject.  Instructors lead participants through series of stretches that are designed to increase flexibility thus improving mobility.  These stretches are additional exercise for major muscle groups that are typically performed during regular warm up and cool down.   Hands Only CPR:  -Group verbal, video, and participation provides a basic overview of AHA guidelines for community CPR. Role-play of emergencies allow participants the opportunity to practice calling for help and chest compression technique with discussion of AED  use.   Hypertension: -Group verbal and written instruction that provides a basic overview of hypertension including the most recent diagnostic guidelines, risk factor reduction with self-care instructions and medication management.    Nutrition I class: Heart Healthy Eating:  -Group instruction provided by PowerPoint slides, verbal discussion, and written materials to support subject matter. The instructor gives an explanation and review of the Therapeutic Lifestyle Changes diet recommendations, which includes a discussion on lipid goals, dietary fat, sodium, fiber, plant stanol/sterol esters, sugar, and the components of a well-balanced, healthy diet.   Nutrition II class: Lifestyle Skills:  -Group instruction provided by PowerPoint slides, verbal discussion, and written materials to support subject matter. The instructor gives an explanation and review of label reading, grocery shopping for heart health, heart healthy recipe modifications, and ways to make healthier choices when eating out.   Diabetes Question & Answer:   -Group instruction provided by PowerPoint slides, verbal discussion, and written materials to support subject matter. The instructor gives an explanation and review of diabetes co-morbidities, pre- and post-prandial blood glucose goals, pre-exercise blood glucose goals, signs, symptoms, and treatment of hypoglycemia and hyperglycemia, and foot care basics.   Diabetes Blitz:  -Group instruction provided by PowerPoint slides, verbal discussion, and written materials to support subject matter. The instructor gives an explanation and review of the physiology behind type 1 and type 2 diabetes, diabetes medications and rational behind using different medications, pre- and post-prandial blood glucose recommendations and Hemoglobin A1c goals, diabetes diet, and exercise including blood glucose guidelines for exercising safely.    Portion Distortion:  -Group instruction provided by PowerPoint slides, verbal discussion, written materials, and food models to support subject matter. The instructor gives an explanation of serving size versus portion size, changes in portions sizes over the last 20 years, and what consists of a serving from each food group.   Stress Management:  -Group instruction provided by verbal instruction, video, and written materials to support subject matter.  Instructors review role of stress in heart disease and how to cope with stress positively.     Exercising on Your Own:  -Group instruction provided by verbal instruction, power point, and written materials to support subject.  Instructors discuss benefits of exercise, components of exercise, frequency and intensity of exercise, and end points for exercise.  Also discuss use of nitroglycerin and activating EMS.  Review options of places to exercise outside of rehab.  Review guidelines for sex with heart disease.   Cardiac Drugs I:  -Group instruction provided by verbal instruction and written materials to support subject.   Instructor reviews cardiac drug classes: antiplatelets, anticoagulants, beta blockers, and statins.  Instructor discusses reasons, side effects, and lifestyle considerations for each drug class.   Cardiac Drugs II:  -Group instruction provided by verbal instruction and written materials to support subject.  Instructor reviews cardiac drug classes: angiotensin converting enzyme inhibitors (ACE-I), angiotensin II receptor blockers (ARBs), nitrates, and calcium channel blockers.  Instructor discusses reasons, side effects, and lifestyle considerations for each drug class.   CARDIAC REHAB PHASE II EXERCISE from 02/06/2019 in [place]  Date  02/06/19  Instruction Review Code  2- Demonstrated Understanding      Anatomy and Physiology of the Circulatory System:  Group verbal and written instruction and models provide basic cardiac anatomy and physiology, with the coronary electrical and arterial systems. Review of: AMI, Angina, Valve disease, Heart Failure, Peripheral Artery Disease, Cardiac Arrhythmia, Pacemakers, and the ICD.   CARDIAC REHAB  PHASE II EXERCISE from 02/06/2019 in [place]  Date  01/30/19  Educator  RN  Instruction Review Code  2- Demonstrated Understanding      Other Education:  -Group or individual verbal, written, or video instructions that support the educational goals of the cardiac rehab program.   Holiday Eating Survival Tips:  -Group instruction provided by PowerPoint slides, verbal discussion, and written materials to support subject matter. The instructor gives patients tips, tricks, and techniques to help them not only survive but enjoy the holidays despite the onslaught of food that accompanies the holidays.   Knowledge Questionnaire Score: Knowledge Questionnaire Score - 01/22/19 1042      Knowledge Questionnaire Score   Pre Score  20/24       Core Components/Risk Factors/Patient Goals at  Admission: Personal Goals and Risk Factors at Admission - 01/22/19 1112      Core Components/Risk Factors/Patient Goals on Admission    Weight Management  Yes;Obesity    Intervention  Weight Management/Obesity: Establish reasonable short term and long term weight goals.;Obesity: Provide education and appropriate resources to help participant work on and attain dietary goals.    Admit Weight  220 lb 14.4 oz (100.2 kg)    Expected Outcomes  Short Term: Continue to assess and modify interventions until short term weight is achieved;Long Term: Adherence to nutrition and physical activity/exercise program aimed toward attainment of established weight goal;Weight Loss: Understanding of general recommendations for a balanced deficit meal plan, which promotes 1-2 lb weight loss per week and includes a negative energy balance of [phone] kcal/d;Understanding recommendations for meals to include 15-35% energy as protein, 25-35% energy from fat, 35-60% energy from carbohydrates, less than 200mg  of dietary cholesterol, 20-35 gm of total fiber daily;Understanding of distribution of calorie intake throughout the day with the consumption of 4-5 meals/snacks    Hypertension  Yes    Intervention  Provide education on lifestyle modifcations including regular physical activity/exercise, weight management, moderate sodium restriction and increased consumption of fresh fruit, vegetables, and low fat dairy, alcohol moderation, and smoking cessation.;Monitor prescription use compliance.    Expected Outcomes  Short Term: Continued assessment and intervention until BP is < 140/23mm HG in hypertensive participants. < 130/56mm HG in hypertensive participants with diabetes, heart failure or chronic kidney disease.;Long Term: Maintenance of blood pressure at goal levels.    Lipids  Yes    Intervention  Provide education and support for participant on nutrition & aerobic/resistive exercise along with prescribed medications to achieve  LDL 70mg , HDL >40mg .    Expected Outcomes  Short Term: Participant states understanding of desired cholesterol values and is compliant with medications prescribed. Participant is following exercise prescription and nutrition guidelines.;Long Term: Cholesterol controlled with medications as prescribed, with individualized exercise RX and with personalized nutrition plan. Value goals: LDL < 70mg , HDL > 40 mg.    Personal Goal Other  Yes    Personal Goal  Patient would like to increase strength in thighs and be able to walk longer without stopping.    Intervention  Provide individualized exercise prescription including aerobic and resistance training to help build strength and stamina. Provide home exercise guidelines for patient to walk at home in addition to exercise at cardiac rehab to help establish a walking routine that patient can continue upon completion of the program.     Expected Outcomes  Patient will be able to walk longer duration without stopping.       Core Components/Risk Factors/Patient Goals  Review:  Goals and Risk Factor Review    Row Name 01/30/19 1515 02/28/19 1439 03/14/19 1121         Core Components/Risk Factors/Patient Goals Review   Personal Goals Review  Weight Management/Obesity;Lipids;Hypertension  Weight Management/Obesity;Lipids;Hypertension  Weight Management/Obesity;Lipids;Hypertension     Review  Pt with multiple CAD RFs willing to participate in CR exercise.  [name] [name] would like to improve her walking and increase the strength in her thighs.   Pt with multiple CAD RFs willing to participate in CR exercise.  [name] [name] says she cant says she enjoys coming to cardiac rehab but she will keep coming to get stronger  Exercise at cardiac rehab is currently on hold per recommended guidelines to prevent the spread of COVID-19     Expected Outcomes  Pt will continue to participate in CR exercise, nutrition, and lifestyle modification opportunities.   Pt will continue to  participate in CR exercise, nutrition, and lifestyle modification opportunities.   Pt will continue to participate in CR exercise, nutrition, and lifestyle modification opportunities when cardiac rehab resumes classes        Core Components/Risk Factors/Patient Goals at Discharge (Final Review):  Goals and Risk Factor Review - 03/14/19 1121      Core Components/Risk Factors/Patient Goals Review   Personal Goals Review  Weight Management/Obesity;Lipids;Hypertension    Review  Exercise at cardiac rehab is currently on hold per recommended guidelines to prevent the spread of COVID-19    Expected Outcomes  Pt will continue to participate in CR exercise, nutrition, and lifestyle modification opportunities when cardiac rehab resumes classes       ITP Comments: ITP Comments    Row Name 01/22/19 0906 01/30/19 1438 02/28/19 1434 03/14/19 1119     ITP Comments  Dr. Fransico Him, Medical Director   30 Day ITP Review. Pt started exercise today and tolerated it well.   30 Day ITP Review. [name] [name] has fair attendance and participation in phase 2 cardiac rehab  Exercise at cardiac rehab is currently on hold per recommended guidelines to prevent the spread of COVID-19       Comments: See ITP comments.Barnet Pall, RN,BSN 03/18/2019 4:09 PM

## 2019-03-20 ENCOUNTER — Ambulatory Visit (HOSPITAL_COMMUNITY): Payer: PPO

## 2019-03-20 ENCOUNTER — Encounter (HOSPITAL_COMMUNITY): Payer: PPO

## 2019-03-22 ENCOUNTER — Encounter (HOSPITAL_COMMUNITY): Payer: PPO

## 2019-03-25 ENCOUNTER — Encounter (HOSPITAL_COMMUNITY): Payer: PPO

## 2019-03-25 ENCOUNTER — Ambulatory Visit (HOSPITAL_COMMUNITY): Payer: PPO

## 2019-03-27 ENCOUNTER — Encounter (HOSPITAL_COMMUNITY): Payer: PPO

## 2019-03-27 ENCOUNTER — Ambulatory Visit (HOSPITAL_COMMUNITY): Payer: PPO

## 2019-03-29 ENCOUNTER — Encounter (HOSPITAL_COMMUNITY): Payer: PPO

## 2019-04-01 ENCOUNTER — Encounter (HOSPITAL_COMMUNITY): Payer: PPO

## 2019-04-01 ENCOUNTER — Ambulatory Visit (HOSPITAL_COMMUNITY): Payer: PPO

## 2019-04-03 ENCOUNTER — Telehealth (HOSPITAL_COMMUNITY): Payer: Self-pay | Admitting: *Deleted

## 2019-04-03 ENCOUNTER — Encounter (HOSPITAL_COMMUNITY): Payer: PPO

## 2019-04-03 ENCOUNTER — Ambulatory Visit (HOSPITAL_COMMUNITY): Payer: PPO

## 2019-04-03 NOTE — Telephone Encounter (Signed)
Called to notify patient that the cardiac and pulmonary rehabilitation department will be closed temporarily due to COVID-19 restrictions. Patient states that she is walking at home 10 minutes BID and will not be returning to cardiac rehab. Will discharge patient from the program at this time.   Sol Passer, [place], ACSM Juanito Doom 04/03/2019 [phone]

## 2019-04-03 NOTE — Addendum Note (Signed)
Encounter addended by: Sol Passer on: 04/03/2019 9:53 AM  Actions taken: Flowsheet data copied forward, Visit Navigator Flowsheet section accepted, Vitals modified

## 2019-04-05 ENCOUNTER — Encounter (HOSPITAL_COMMUNITY): Payer: PPO

## 2019-04-08 ENCOUNTER — Encounter (HOSPITAL_COMMUNITY): Payer: PPO

## 2019-04-08 ENCOUNTER — Ambulatory Visit (HOSPITAL_COMMUNITY): Payer: PPO

## 2019-04-09 ENCOUNTER — Other Ambulatory Visit: Payer: Self-pay | Admitting: Cardiovascular Disease

## 2019-04-09 ENCOUNTER — Other Ambulatory Visit: Payer: Self-pay

## 2019-04-09 MED ORDER — LOSARTAN POTASSIUM 50 MG PO TABS: 50.0000 mg | ORAL_TABLET | Freq: Every day | ORAL | 0 refills | Status: AC

## 2019-04-09 NOTE — Telephone Encounter (Signed)
Losartan refilled 

## 2019-04-10 ENCOUNTER — Encounter (HOSPITAL_COMMUNITY): Payer: PPO

## 2019-04-10 ENCOUNTER — Ambulatory Visit (HOSPITAL_COMMUNITY): Payer: PPO

## 2019-04-12 ENCOUNTER — Encounter (HOSPITAL_COMMUNITY): Payer: PPO

## 2019-04-15 ENCOUNTER — Ambulatory Visit (HOSPITAL_COMMUNITY): Payer: PPO

## 2019-04-15 ENCOUNTER — Encounter (HOSPITAL_COMMUNITY): Payer: PPO

## 2019-04-17 ENCOUNTER — Ambulatory Visit (HOSPITAL_COMMUNITY): Payer: PPO

## 2019-04-17 ENCOUNTER — Encounter (HOSPITAL_COMMUNITY): Payer: PPO

## 2019-04-17 ENCOUNTER — Telehealth: Payer: Self-pay | Admitting: Cardiovascular Disease

## 2019-04-17 NOTE — Telephone Encounter (Signed)
New Message            Patient is calling to ask questions about an appointment, she would like a call back.[phone]

## 2019-04-17 NOTE — Telephone Encounter (Signed)
Spoke with pt who wanted to know last office visit and when next visit is due. Pt is to be seen in 3 mon with APP and in 6 mon with Dr. Alvester Chou.

## 2019-04-19 ENCOUNTER — Telehealth: Payer: Self-pay | Admitting: Cardiology

## 2019-04-19 ENCOUNTER — Encounter (HOSPITAL_COMMUNITY): Payer: PPO

## 2019-04-19 ENCOUNTER — Telehealth: Payer: Self-pay | Admitting: Cardiovascular Disease

## 2019-04-19 NOTE — Telephone Encounter (Signed)
Call home phone, don't want to do webex/ my chart/ consent/ pre reg completed

## 2019-04-19 NOTE — Telephone Encounter (Signed)
° ° ° °*  STAT* If patient is at the pharmacy, call can be transferred to refill team.   1. Which medications need to be refilled? (please list name of each medication and dose if known)Evolocumab (REPATHA SURECLICK) 209 MG/ML SOAJ  2. Which pharmacy/location (including street and city if local pharmacy) is medication to be sent to? [facility] [place]  3. Do they need a 30 day or 90 day supply?

## 2019-04-22 ENCOUNTER — Telehealth: Payer: Self-pay

## 2019-04-22 ENCOUNTER — Encounter (HOSPITAL_COMMUNITY): Payer: PPO

## 2019-04-22 ENCOUNTER — Ambulatory Visit (HOSPITAL_COMMUNITY): Payer: PPO

## 2019-04-22 ENCOUNTER — Telehealth (INDEPENDENT_AMBULATORY_CARE_PROVIDER_SITE_OTHER): Payer: PPO | Admitting: Cardiology

## 2019-04-22 ENCOUNTER — Encounter: Payer: Self-pay | Admitting: Cardiology

## 2019-04-22 VITALS — BP 159/94 | HR 88 | Ht 65.0 in | Wt 217.0 lb

## 2019-04-22 DIAGNOSIS — Z953 Presence of xenogenic heart valve: Secondary | ICD-10-CM

## 2019-04-22 DIAGNOSIS — I712 Thoracic aortic aneurysm, without rupture, unspecified: Secondary | ICD-10-CM

## 2019-04-22 DIAGNOSIS — E785 Hyperlipidemia, unspecified: Secondary | ICD-10-CM | POA: Diagnosis not present

## 2019-04-22 DIAGNOSIS — I10 Essential (primary) hypertension: Secondary | ICD-10-CM

## 2019-04-22 DIAGNOSIS — R3915 Urgency of urination: Secondary | ICD-10-CM | POA: Diagnosis not present

## 2019-04-22 DIAGNOSIS — Z951 Presence of aortocoronary bypass graft: Secondary | ICD-10-CM

## 2019-04-22 NOTE — Telephone Encounter (Signed)
Lipid PA 

## 2019-04-22 NOTE — Progress Notes (Signed)
 Virtual Visit via Telephone Note   This visit type was conducted due to national recommendations for restrictions regarding the COVID-19 Pandemic (e.g. social distancing) in an effort to limit this patient's exposure and mitigate transmission in our community.  Due to her co-morbid illnesses, this patient is at least at moderate risk for complications without adequate follow up.  This format is felt to be most appropriate for this patient at this time.  The patient did not have access to video technology/had technical difficulties with video requiring transitioning to audio format only (telephone).  All issues noted in this document were discussed and addressed.  No physical exam could be performed with this format.  Please refer to the patient's chart for her  consent to telehealth for [facility].  Evaluation Performed:  Follow-up visit  This visit type was conducted due to national recommendations for restrictions regarding the COVID-19 Pandemic (e.g. social distancing).  This format is felt to be most appropriate for this patient at this time.  All issues noted in this document were discussed and addressed.  No physical exam was performed (except for noted visual exam findings with Video Visits).  Please refer to the patient's chart (MyChart message for video visits and phone note for telephone visits) for the patient's consent to telehealth for [facility].  Date:  04/22/2019   ID:  [name] [name], DOB 07/16/1946, MRN [id number]  Patient Location: Home [address] [place] [address]   Provider location:   Home- [place]  PCP:  Judd, Donna J, PA-C  Cardiologist:  Jonathan Berry, MD Electrophysiologist:  None   Chief Complaint:  HTN  History of Present Illness:    [name] [name] is a 73 y.o. female who presents via audio/video conferencing for a telehealth visit today.  The patient was referred to Dr. Berry for dyspnea in 2019.  She has a history of hypertension,  dyslipidemia with statin intolerance, and aortic stenosis.  She was having increasing symptoms of dyspnea and underwent catheterization in September 2019.  This revealed severe aortic stenosis and two-vessel coronary disease.  She underwent bypass surgery x2 with an LIMA to the LAD and an SVG to PDA as well as a tissue AVR.  She had some brief PAF postop and was on short-term amiodarone.    Dr Berry saw her in follow-up and an echocardiogram in January 2020 showed an enlarged aortic root at 4.7 cm.  Previous CT scan in September 2019 showed this to be 3.9 cm.  A repeat CT scan was done on February 19, 2019 and it showed her aortic root was 4.1 cm.  The patient called the office today concerned that her blood pressures been elevated.  She remains at home and isolation during the pandemic.  Her daughter, Sherry, works at [employer] in [place].  She usually checks her mother's blood pressure with a large cuff.  Sheri has been working in the COVID testing facility and has not wanted to expose her mother.  A home blood pressure monitoring kit was provided to the patient.  This does not have a large cuff.  It shows her blood pressure is been running high.  The patient denies any unusual tachycardia or chest pain.  She has noted some dyspnea on exertion but this is not really changed.  Apparently she was fairly debilitated after her surgery and was actually working with physical therapy for a time but this had to stop.  The patient was concerned   and called.  She also had several other concerns about her risk during the pandemic that I discussed with her at length.   The patient does not symptoms concerning for COVID-19 infection (fever, chills, cough, or new SHORTNESS OF BREATH).    Prior CV studies:   The following studies were reviewed today:  Past Medical History:  Diagnosis Date  . Aortic stenosis, severe 08/2018   progressed from moderate by ECHO 01-03-17   . Arthritis   . Cancer (HCC)     basal cell skin cancer  . Carotid artery bruit   . Claustrophobia   . Coronary artery disease   . Diverticulosis   . Family history of heart disease   . GERD (gastroesophageal reflux disease)   . Gout   . History of hiatal hernia   . History of kidney stones   . Hyperlipemia   . Hypertension   . Irritable bowel syndrome   . Thyroid disease   . UTI (urinary tract infection) 03/09/14   Past Surgical History:  Procedure Laterality Date  . AORTIC VALVE REPLACEMENT N/A 09/24/2018   Procedure: AORTIC VALVE REPLACEMENT (AVR) using  an INSPIRIS RESILIA  Prosthetic Valve (Model #: 11500A, Size: 23MM, Serial #: [id number]);  Surgeon: Hendrickson, Steven C, MD;  Location: [facility] OR;  Service: Open Heart Surgery;  Laterality: N/A;  . APPENDECTOMY     taken out with hysterectomy  . BLADDER SURGERY     Lift   . CARDIAC CATHETERIZATION    . COLONOSCOPY    . CORONARY ARTERY BYPASS GRAFT N/A 09/24/2018   Procedure: CORONARY ARTERY BYPASS GRAFTING (CABG) x 2; (LIMA to LAD, SVG to PDA) using RIGHT THIGH GREATER SAPHENOUS VEIN GRAFT (SVG) and LEFT INTERNAL MAMMARY ARTERY (LIMA): LIMA to LAD, SVG to PDA ;  Surgeon: Hendrickson, Steven C, MD;  Location: [facility] OR;  Service: Open Heart Surgery;  Laterality: N/A;  . EYE SURGERY Bilateral    cataract surgery with lens implants  . lithotripsy   within last 10 years  . OOPHORECTOMY    . PARTIAL HYSTERECTOMY    . RIGHT/LEFT HEART CATH AND CORONARY ANGIOGRAPHY N/A 09/10/2018   Procedure: RIGHT/LEFT HEART CATH AND CORONARY ANGIOGRAPHY;  Surgeon: Berry, Jonathan J, MD;  Location: [facility] INVASIVE CV LAB;  Service: Cardiovascular;  Laterality: N/A;  . TEE WITHOUT CARDIOVERSION N/A 09/24/2018   Procedure: TRANSESOPHAGEAL ECHOCARDIOGRAM (TEE);  Surgeon: Hendrickson, Steven C, MD;  Location: [facility] OR;  Service: Open Heart Surgery;  Laterality: N/A;  . TOTAL KNEE ARTHROPLASTY Right 07/24/2017   Procedure: RIGHT TOTAL KNEE ARTHROPLASTY;  Surgeon: Aluisio, Frank, MD;  Location: [facility] ORS;   Service: Orthopedics;  Laterality: Right;  Adductor Block     Current Meds  Medication Sig  . allopurinol (ZYLOPRIM) 100 MG tablet Take 1 tablet (100 mg total) by mouth daily.  . aspirin 81 MG EC tablet Take 1 tablet (81 mg total) by mouth daily.  . cephALEXin (KEFLEX) 500 MG capsule Take all 4 tablets one hour prior to dental procedure  . Cholecalciferol (VITAMIN D) 2000 units tablet Take 1 tablet by mouth daily.  . dicyclomine (BENTYL) 10 MG capsule Take 10 mg by mouth 4 (four) times daily as needed for spasms.   . Evolocumab (REPATHA SURECLICK) 140 MG/ML SOAJ Inject 140 mg into the skin every 14 (fourteen) days.  . hydrochlorothiazide (MICROZIDE) 12.5 MG capsule Take 1 capsule (12.5 mg total) by mouth daily.  . lansoprazole (PREVACID) 30 MG capsule Take 30 mg by mouth daily as needed (  heartburn).   . levothyroxine (SYNTHROID, LEVOTHROID) 125 MCG tablet Take 100 mcg by mouth daily before breakfast.   . losartan (COZAAR) 50 MG tablet Take 1 tablet (50 mg total) by mouth daily.  . meclizine (ANTIVERT) 25 MG tablet Take 25 mg by mouth 3 (three) times daily as needed for dizziness.   . metoprolol tartrate (LOPRESSOR) 25 MG tablet Take 1 tablet (25 mg total) by mouth 2 (two) times daily.  . Propylene Glycol (SYSTANE BALANCE) 0.6 % SOLN Place 1 drop into both eyes 2 (two) times daily as needed (dry eyes).  . traMADol (ULTRAM) 50 MG tablet Take 50 mg by mouth every 4-6 hours PRN severe pain  . trimethoprim (TRIMPEX) 100 MG tablet Take 100 mg by mouth daily as needed (For UTI Prevention).      Allergies:   Amoxicillin; Amoxicillin-pot clavulanate; Ampicillin; Clarithromycin; Clopidogrel bisulfate; Nitrofurantoin; Plavix [clopidogrel bisulfate]; and Zestril [lisinopril]   Social History   Tobacco Use  . Smoking status: Former Smoker    Packs/day: 0.80    Years: 20.00    Pack years: 16.00    Types: Cigarettes    Last attempt to quit: 09/21/1987    Years since quitting: 31.6  . Smokeless  tobacco: Never Used  . Tobacco comment: socially  Substance Use Topics  . Alcohol use: No  . Drug use: No     Family Hx: The patient's family history includes Heart disease in her father and mother. There is no history of Colon cancer.  ROS:   Please see the history of present illness.    All other systems reviewed and are negative.   Labs/Other Tests and Data Reviewed:    Recent Labs: 09/05/2018: TSH 0.474 09/25/2018: Magnesium 2.4 02/14/2019: ALT 14; BUN 15; Creatinine, Ser 0.74; Hemoglobin 14.2; Platelets 276; Potassium 4.4; Sodium 143   Recent Lipid Panel Lab Results  Component Value Date/Time   CHOL 159 02/14/2019 10:28 AM   TRIG 212 (H) 02/14/2019 10:28 AM   HDL 45 02/14/2019 10:28 AM   CHOLHDL 3.5 02/14/2019 10:28 AM   LDLCALC 72 02/14/2019 10:28 AM    Wt Readings from Last 3 Encounters:  04/22/19 217 lb (98.4 kg)  03/06/19 222 lb 10.6 oz (101 kg)  02/13/19 218 lb (98.9 kg)     Exam:    Vital Signs:  BP (!) 159/94   Pulse 88   Ht 5' 5" (1.651 m)   Wt 217 lb (98.4 kg)   BMI 36.11 kg/m    Pt was in no acute distress on the phone  ASSESSMENT & PLAN:    Enlarged Aortic root- 4.7 cm on echo Jan 2020. Repeat CTA done 02/14/2019 showed this to be 4.1cm  Essential hypertension- Her B/P is high but it is on a regular adult cuff- this has been given her high readings in the past.    Dyslipidemia- Satin intolerant.  Her LDL was 72 on Repatha but she is having trouble affording this  S/P CABG x 2 and tissue AVR Sept 2019- echo Jan 2020 showed good valve function and normal LVF  COVID-19 Education: The signs and symptoms of COVID-19 were discussed with the patient and how to seek care for testing (follow up with PCP or arrange E-visit).  The importance of social distancing was discussed today.  Patient Risk:   After full review of this patients clinical status, I feel that they are at least moderate risk at this time.  Time:   Today, I have spent 35 minutes    with the patient with telehealth technology discussing HTN and aortic root enlargment.     Medication Adjustments/Labs and Tests Ordered: Current medicines are reviewed at length with the patient today.  Concerns regarding medicines are outlined above.  Tests Ordered: No orders of the defined types were placed in this encounter.  Medication Changes: No orders of the defined types were placed in this encounter.   Disposition:  She has Amlodipine at home.  She was taken off this post op-I suspect secondary to low B/P.  She had edema with the 10 mg dose but did OK with the 5 mg dose pre op. I suggested she resume the 5 mg daily Amlodipine.  She will go to ther PCP's office next week for a B/P check with a large cuff.  Goal B/P 120/80 or less. F/U with Dr Berry in Sept.   Signed, Luke Kilroy, PA-C  04/22/2019 12:16 PM    [facility] 

## 2019-04-22 NOTE — Telephone Encounter (Signed)
AVS instructions given and patient voiced understanding.

## 2019-04-22 NOTE — Patient Instructions (Addendum)
Medication Instructions:  START Norvasc 5mg  Take 1(one) tablet daily  If you need a refill on your cardiac medications before your next appointment, please call your pharmacy.   Lab work: None  If you have labs (blood work) drawn today and your tests are completely normal, you will receive your results only by: Marland Kitchen MyChart Message (if you have MyChart) OR . A paper copy in the mail If you have any lab test that is abnormal or we need to change your treatment, we will call you to review the results.  Testing/Procedures: None   Follow-Up: At [facility], you and your health needs are our priority.  As part of our continuing mission to provide you with exceptional heart care, we have created designated Provider Care Teams.  These Care Teams include your primary Cardiologist (physician) and Advanced Practice Providers (APPs -  Physician Assistants and Nurse Practitioners) who all work together to provide you with the care you need, when you need it. Marland Kitchen LUKE RECOMMENDS YOU FOLLOW UP WITH DR Gwenlyn Found AS SCHEDULED  Any Other Special Instructions Will Be Listed Below (If Applicable). Please call our office with the blood pressure reading from your PCP's office

## 2019-04-23 ENCOUNTER — Telehealth: Payer: Self-pay | Admitting: Pharmacist

## 2019-04-23 NOTE — Telephone Encounter (Signed)
Rapatha PA renewal approved until 4/32021. Patient needs next injection for 5/8//2020.

## 2019-04-24 ENCOUNTER — Encounter (HOSPITAL_COMMUNITY): Payer: PPO

## 2019-04-24 ENCOUNTER — Ambulatory Visit (HOSPITAL_COMMUNITY): Payer: PPO

## 2019-04-26 ENCOUNTER — Telehealth: Payer: Self-pay

## 2019-04-26 ENCOUNTER — Encounter (HOSPITAL_COMMUNITY): Payer: PPO

## 2019-04-26 NOTE — Telephone Encounter (Signed)
Medication Samples have been provided to the patient.  Drug name: Repatha     Strength: 140mg        Qty: 1  PND:[id number]  Exp.Date: 5/22  The patient has been instructed regarding the correct time, dose, and frequency of taking this medication, including desired effects and most common side effects.   Elih Mooney Rodriguez-Guzman PharmD, BCPS, [place] [address] 04/26/2019 2:13 PM

## 2019-04-26 NOTE — Telephone Encounter (Signed)
Completed and verified with pharmacy

## 2019-04-26 NOTE — Telephone Encounter (Signed)
Patient needs 1 sample (overdue for next dose). Amgen safety net also needed.

## 2019-04-29 ENCOUNTER — Encounter (HOSPITAL_COMMUNITY): Payer: PPO

## 2019-04-29 ENCOUNTER — Ambulatory Visit (HOSPITAL_COMMUNITY): Payer: PPO

## 2019-05-01 ENCOUNTER — Ambulatory Visit (HOSPITAL_COMMUNITY): Payer: PPO

## 2019-05-01 ENCOUNTER — Encounter (HOSPITAL_COMMUNITY): Payer: PPO

## 2019-05-01 NOTE — Telephone Encounter (Signed)
B/Ps look good- no change in Rx.  Check B/P 3 times a week at different times of the day and keep a record.  Call back if her B/P is consistently running > 017 systolic or > 90 diastolic.  Thanks,   Kerin Ransom PA-C 05/01/2019 2:51 PM

## 2019-05-01 NOTE — Telephone Encounter (Signed)
Spoke with the pt. Pt made aware of Lurena Joiner Kilroy's recommendation. Pt verbalized understanding and voiced appreciation for the call.

## 2019-05-01 NOTE — Telephone Encounter (Signed)
Spoke with the pt. Pt sts that she was calling to give Kerin Ransom, PA an update of her BP as instructed. Pt sts that she was not able to get in to her pcp to have her BP check due COVID-19. She purchased a BP with a reg BP cuff and was getting elevated readings that she felt were not accurate.  She has been unable to locate a appropriate fitting cuff. She has been taking her BP using the regular sized cuff on her forearm. Pt sts that he BP readings have been close goal and consistent. She has been limiting her sodium intake  BP today: 119/72 57bpm 110/64 64bpm Pt sts that she is taking the following for BP: Hctz 12.5mg  daily Losartan 50mg  daily Metoprolol 25mg  bid Amlodipine 10mg  daily Pt sts that she has Losartan-HCTZ 100-12.5mg  on hand if Lurena Joiner thinks she needs increased dosage. Adv the pt to continue to monitor her BP. I will fwd the update to [facility] and we will call back with his recommendation.

## 2019-05-01 NOTE — Telephone Encounter (Signed)
F/U Message            Patient is retuning a call

## 2019-05-03 ENCOUNTER — Encounter (HOSPITAL_COMMUNITY): Payer: PPO

## 2019-05-06 ENCOUNTER — Ambulatory Visit (HOSPITAL_COMMUNITY): Payer: PPO

## 2019-05-08 ENCOUNTER — Ambulatory Visit (HOSPITAL_COMMUNITY): Payer: PPO

## 2019-05-13 ENCOUNTER — Ambulatory Visit (HOSPITAL_COMMUNITY): Payer: PPO

## 2019-05-15 ENCOUNTER — Ambulatory Visit (HOSPITAL_COMMUNITY): Payer: PPO

## 2019-05-16 ENCOUNTER — Encounter: Payer: Self-pay | Admitting: Gastroenterology

## 2019-05-18 ENCOUNTER — Encounter (HOSPITAL_COMMUNITY): Payer: Self-pay | Admitting: *Deleted

## 2019-05-18 DIAGNOSIS — Z952 Presence of prosthetic heart valve: Secondary | ICD-10-CM

## 2019-05-18 DIAGNOSIS — Z951 Presence of aortocoronary bypass graft: Secondary | ICD-10-CM

## 2019-05-18 NOTE — Progress Notes (Signed)
Discharge Progress Report  Patient Details  Name: VALEDA CORZINE MRN: [id number] Date of Birth: December 02, 1946 Referring Provider:     [place] from 01/22/2019 in [place]  Referring Provider  Quay Burow, MD       Number of Visits: 10  Reason for Discharge:  Early Exit:  Departement closed due to COVID 19 Pandemic  Smoking History:  Social History   Tobacco Use  Smoking Status Former Smoker  . Packs/day: 0.80  . Years: 20.00  . Pack years: 16.00  . Types: Cigarettes  . Last attempt to quit: 09/21/1987  . Years since quitting: 31.6  Smokeless Tobacco Never Used  Tobacco Comment   socially    Diagnosis:  09/24/18 CABG x2  09/24/18 AVR  ADL UCSD:   Initial Exercise Prescription:   Discharge Exercise Prescription (Final Exercise Prescription Changes):   Functional Capacity:   Psychological, QOL, Others - Outcomes: PHQ 2/9: Depression screen [facility] 2/9 01/30/2019 10/30/2018  Decreased Interest 0 0  Down, Depressed, Hopeless 0 0  PHQ - 2 Score 0 0    Quality of Life:   Personal Goals: Goals established at orientation with interventions provided to work toward goal.    Personal Goals Discharge:   Exercise Goals and Review:   05/18/19 Jamas Lav attended 10 exercise sessions on 01/22/19-03/06/19. Elida did fair with exercise despite her orthopedic issues. Makinzi does not plan to return to cardiac rehab when the program resumes and would like to be discharged.   Barnet Pall, RN,BSN 05/18/2019 9:41 AM

## 2019-05-18 NOTE — Progress Notes (Signed)
cr

## 2019-05-22 ENCOUNTER — Ambulatory Visit (HOSPITAL_COMMUNITY): Payer: PPO

## 2019-05-27 ENCOUNTER — Ambulatory Visit (HOSPITAL_COMMUNITY): Payer: PPO

## 2019-05-29 ENCOUNTER — Ambulatory Visit (HOSPITAL_COMMUNITY): Payer: PPO

## 2019-06-03 ENCOUNTER — Ambulatory Visit (HOSPITAL_COMMUNITY): Payer: PPO

## 2019-06-05 ENCOUNTER — Ambulatory Visit (HOSPITAL_COMMUNITY): Payer: PPO

## 2019-06-10 ENCOUNTER — Ambulatory Visit (HOSPITAL_COMMUNITY): Payer: PPO

## 2019-06-12 ENCOUNTER — Ambulatory Visit (HOSPITAL_COMMUNITY): Payer: PPO

## 2019-06-12 IMAGING — CR DG CHEST 2V
2 series · 2 of 2 positions shown · non-contrast
Comparison: 09/20/2017

CLINICAL DATA: Coronary artery disease and aortic stenosis

EXAM:
CHEST - 2 VIEW

[w chest pa]
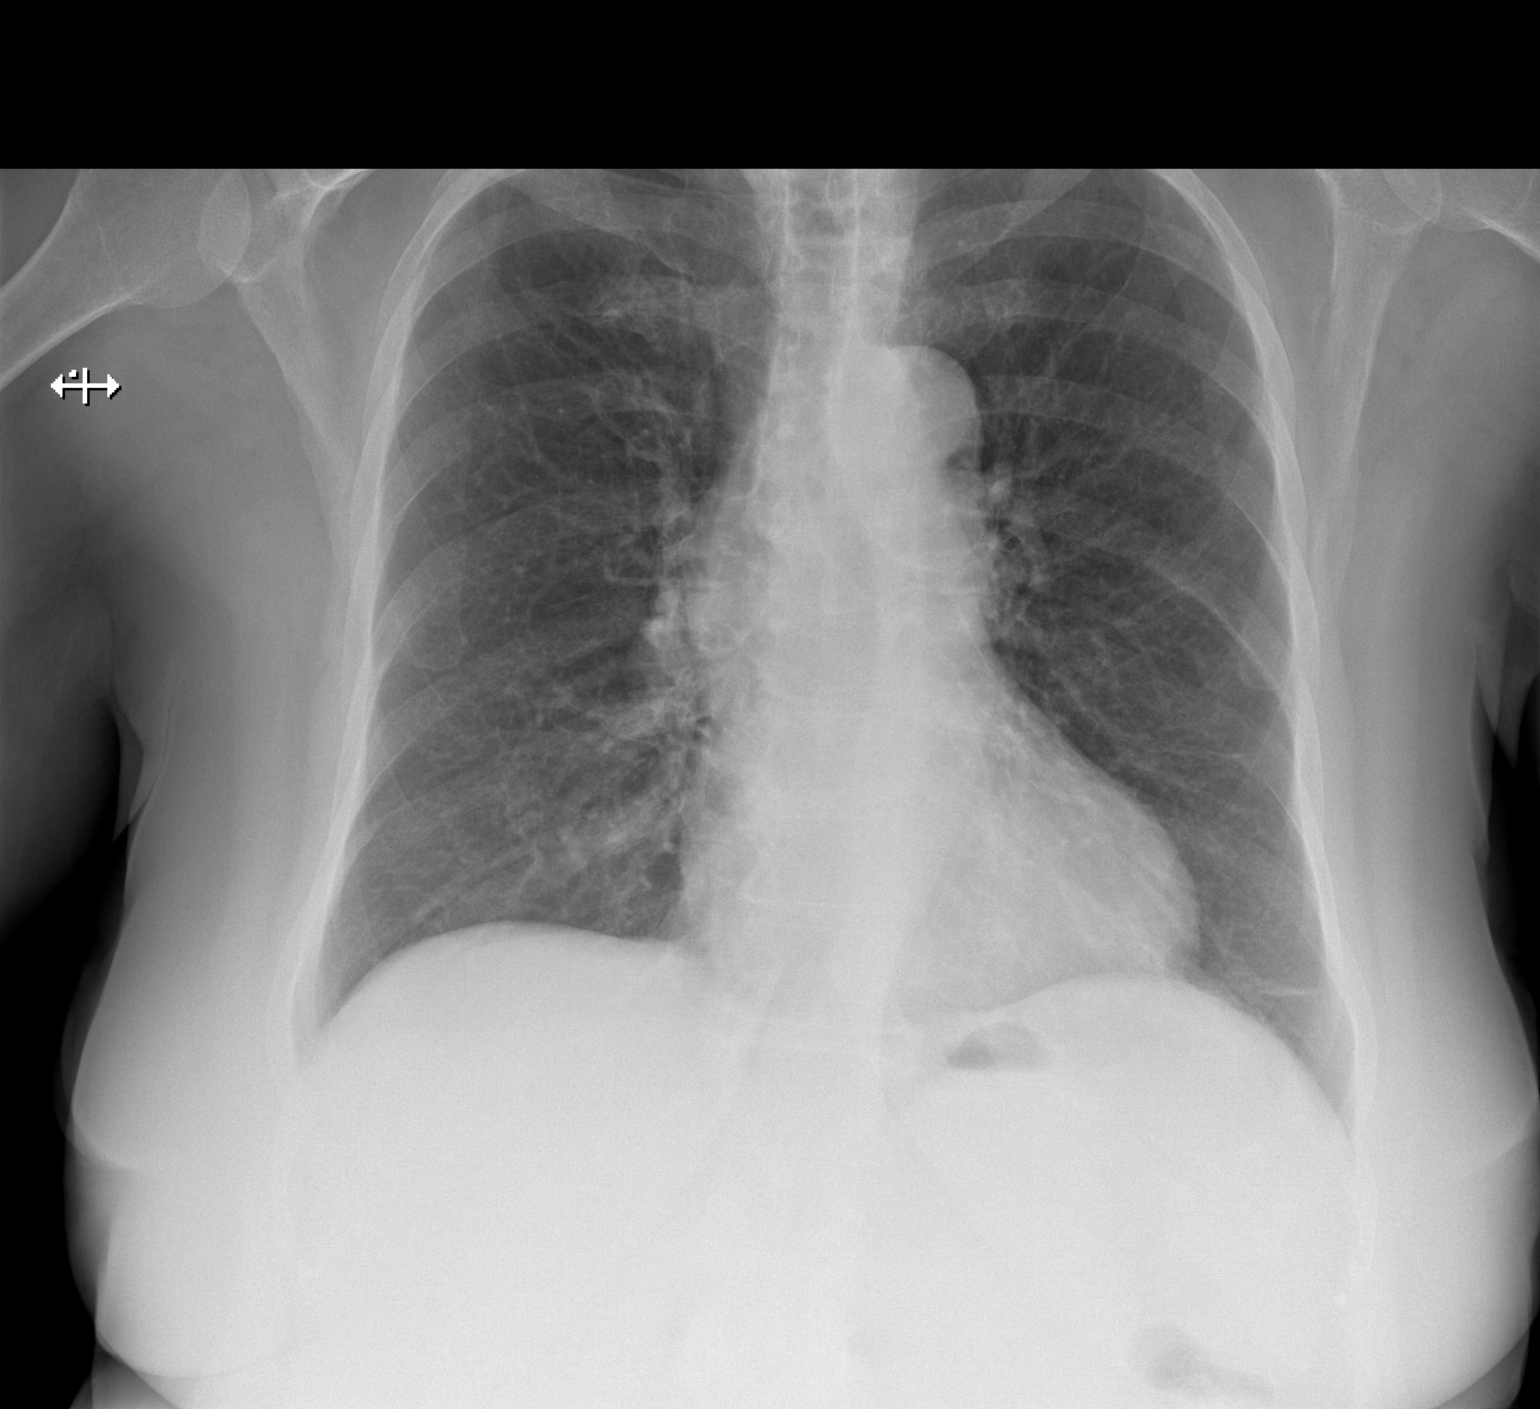

[w chest lat]
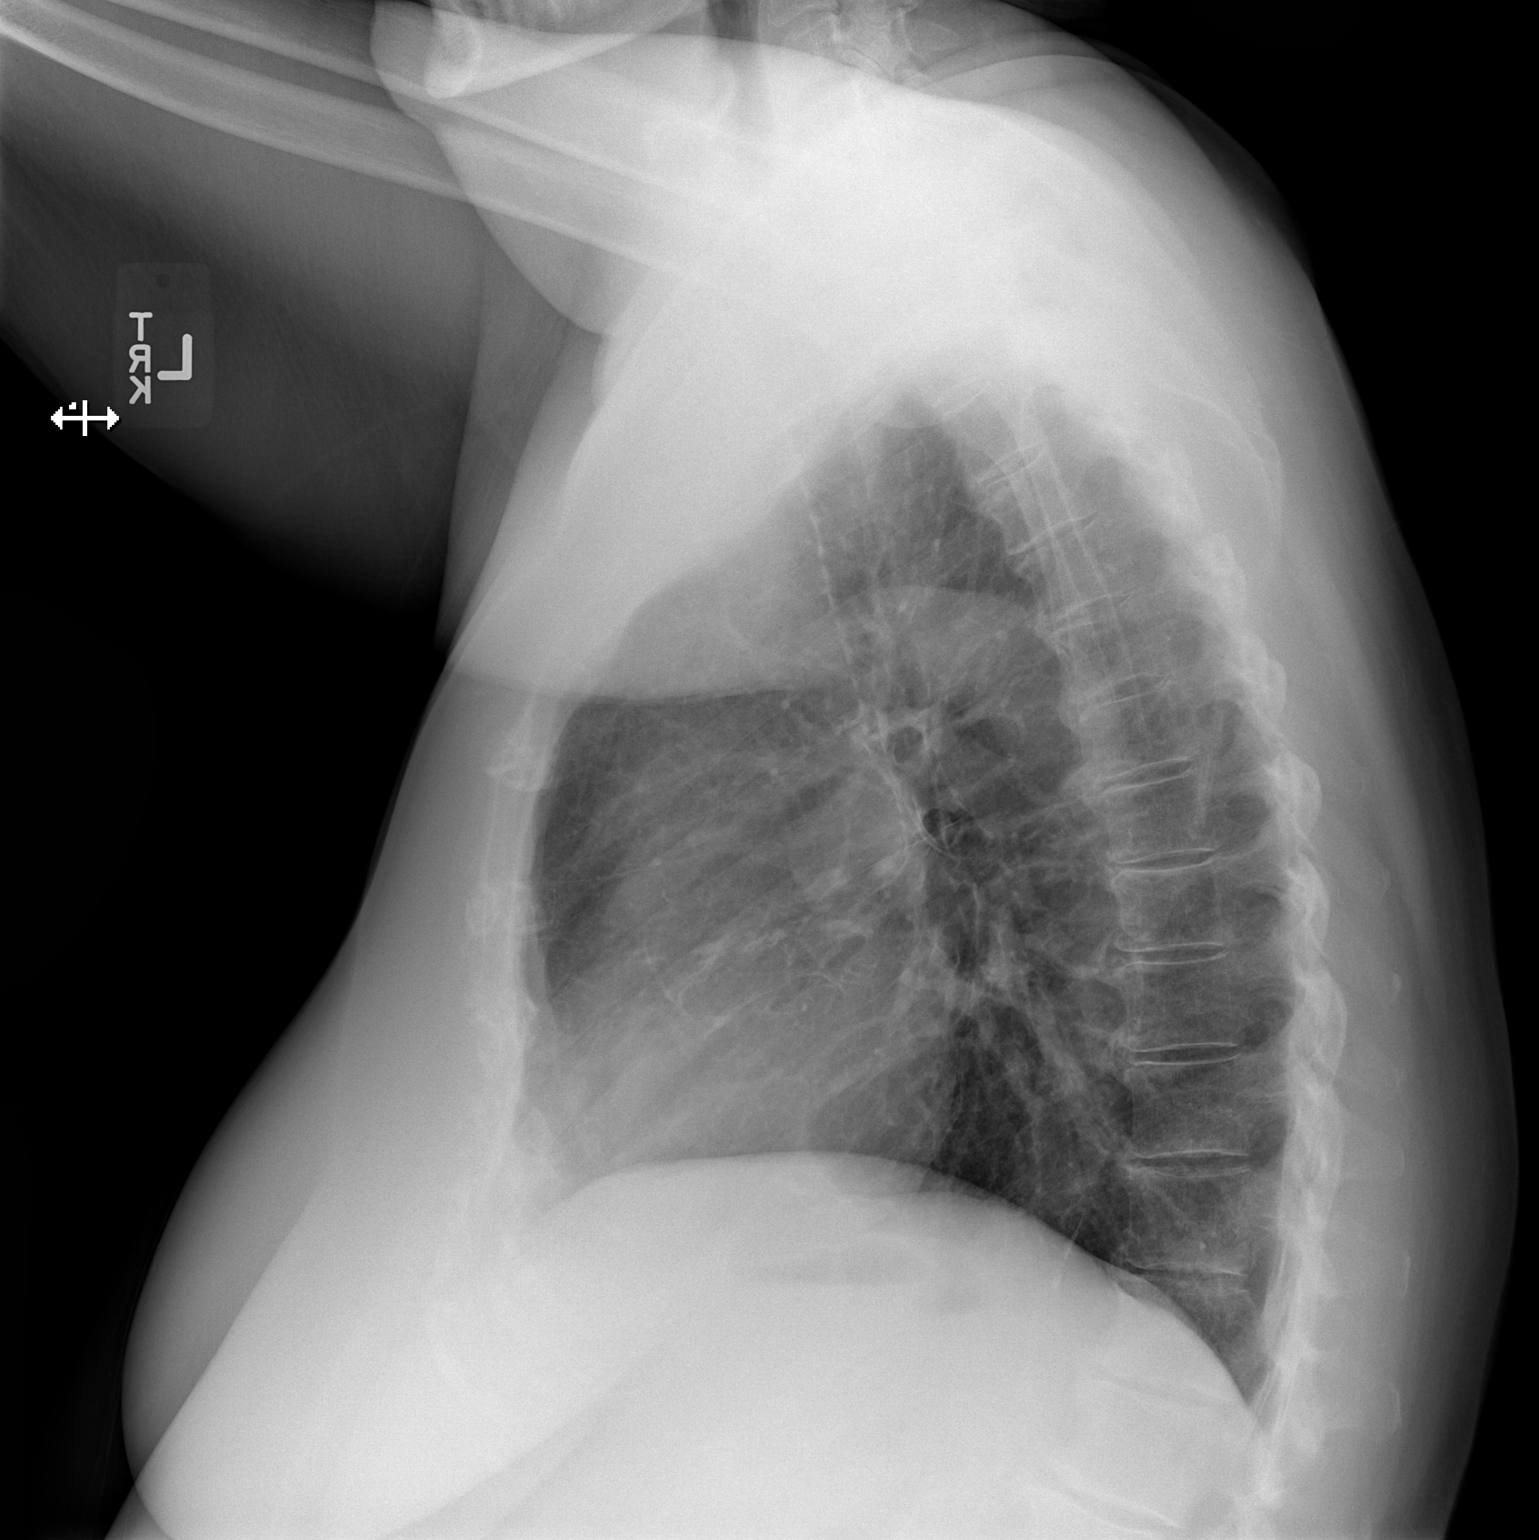

[2 of 2 positions shown; findings below may reference images not displayed]

FINDINGS: Cardiac shadows within normal limits. Lungs are well aerated
bilaterally with minimal left basilar scarring. No sizable
infiltrate or effusion is seen. Mild degenerative changes of
thoracic spine are noted.
IMPRESSION: Minimal left basilar scarring.  No acute abnormality noted.

## 2019-06-13 DIAGNOSIS — Z1231 Encounter for screening mammogram for malignant neoplasm of breast: Secondary | ICD-10-CM | POA: Diagnosis not present

## 2019-06-17 ENCOUNTER — Ambulatory Visit (HOSPITAL_COMMUNITY): Payer: PPO

## 2019-06-17 IMAGING — DX DG CHEST 1V PORT
1 series · 1 of 1 positions shown · non-contrast
Comparison: 09/25/2018

CLINICAL DATA: CABG

EXAM:
PORTABLE CHEST 1 VIEW

[chest]
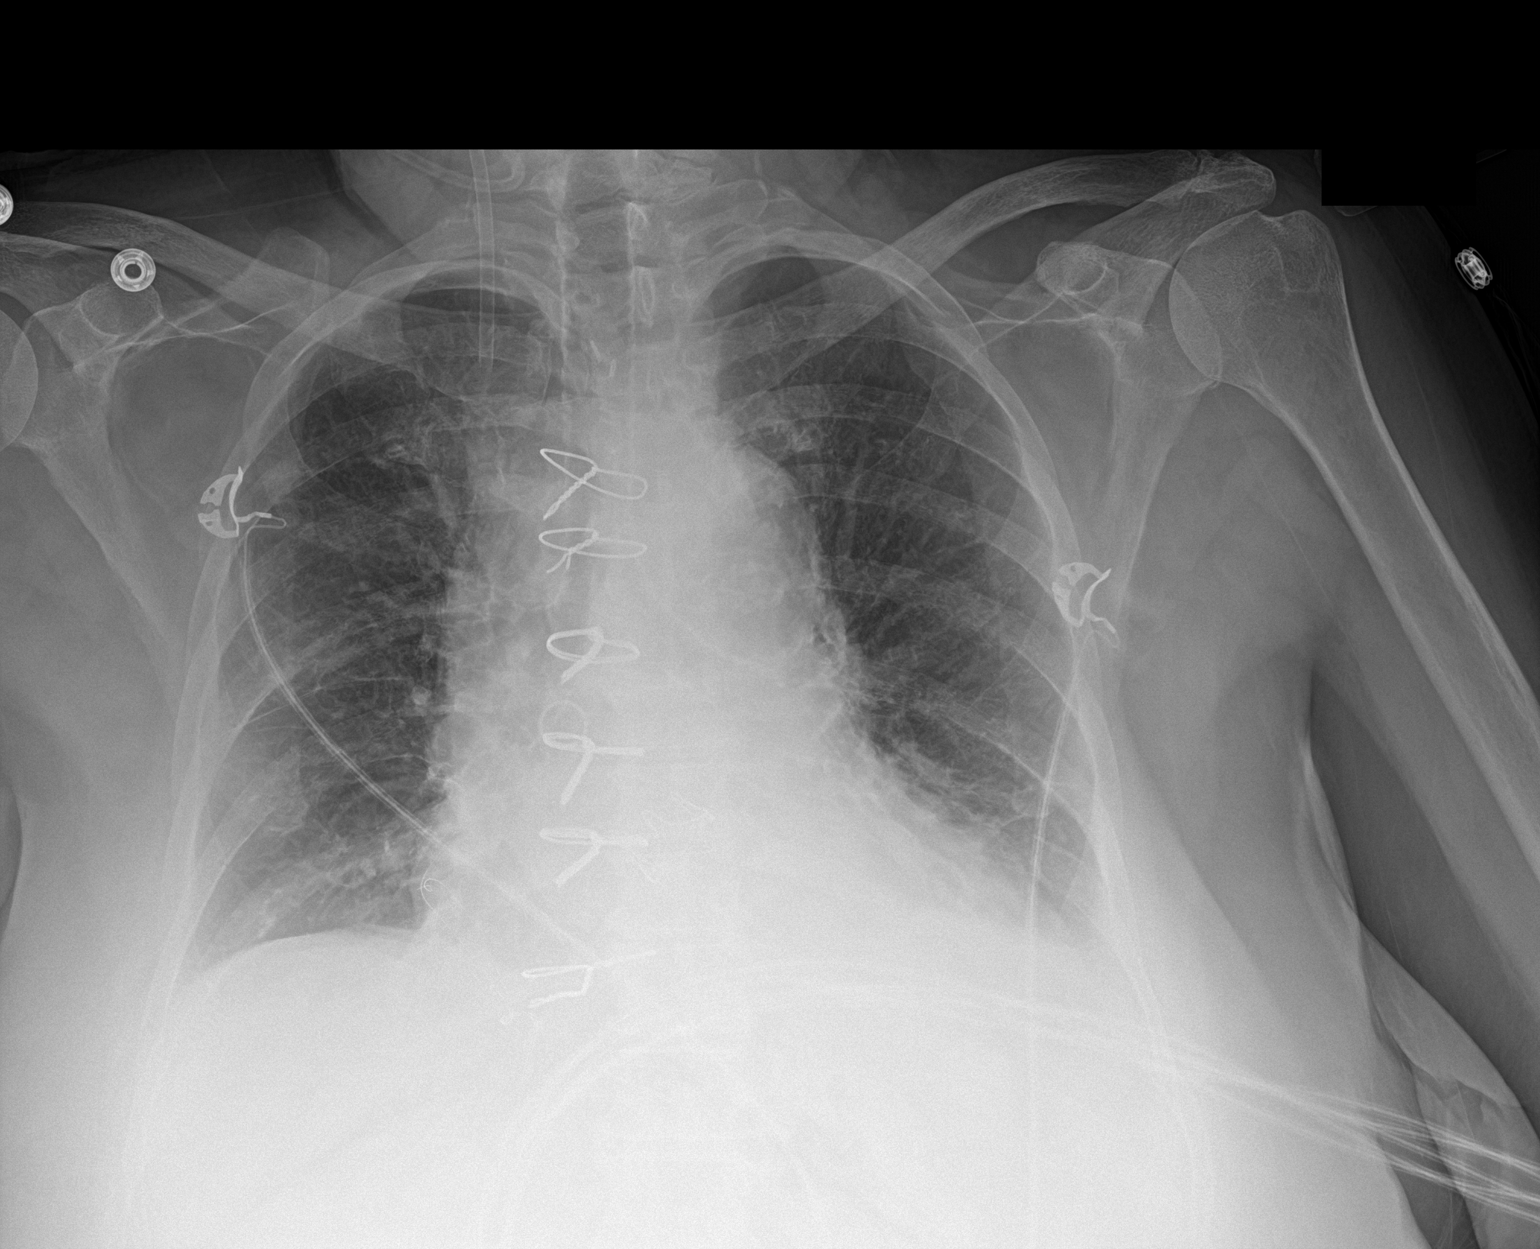

[1 of 1 positions shown; findings below may reference images not displayed]

FINDINGS: Swan-Ganz catheter removed.  Chest tubes removed.  No pneumothorax

Mild left lower lobe atelectasis and small left effusion unchanged.
Right lung clear. No edema.
IMPRESSION: Negative for pneumothorax post chest tube removal

Left lower lobe atelectasis and effusion unchanged.

## 2019-06-19 ENCOUNTER — Ambulatory Visit (HOSPITAL_COMMUNITY): Payer: PPO

## 2019-06-19 IMAGING — DX DG CHEST 2V
2 series · 2 of 2 positions shown · non-contrast
Comparison: Chest x-ray dated 09/26/2018.

CLINICAL DATA: Soreness status post CABG

EXAM:
CHEST - 2 VIEW

[w chest pa]
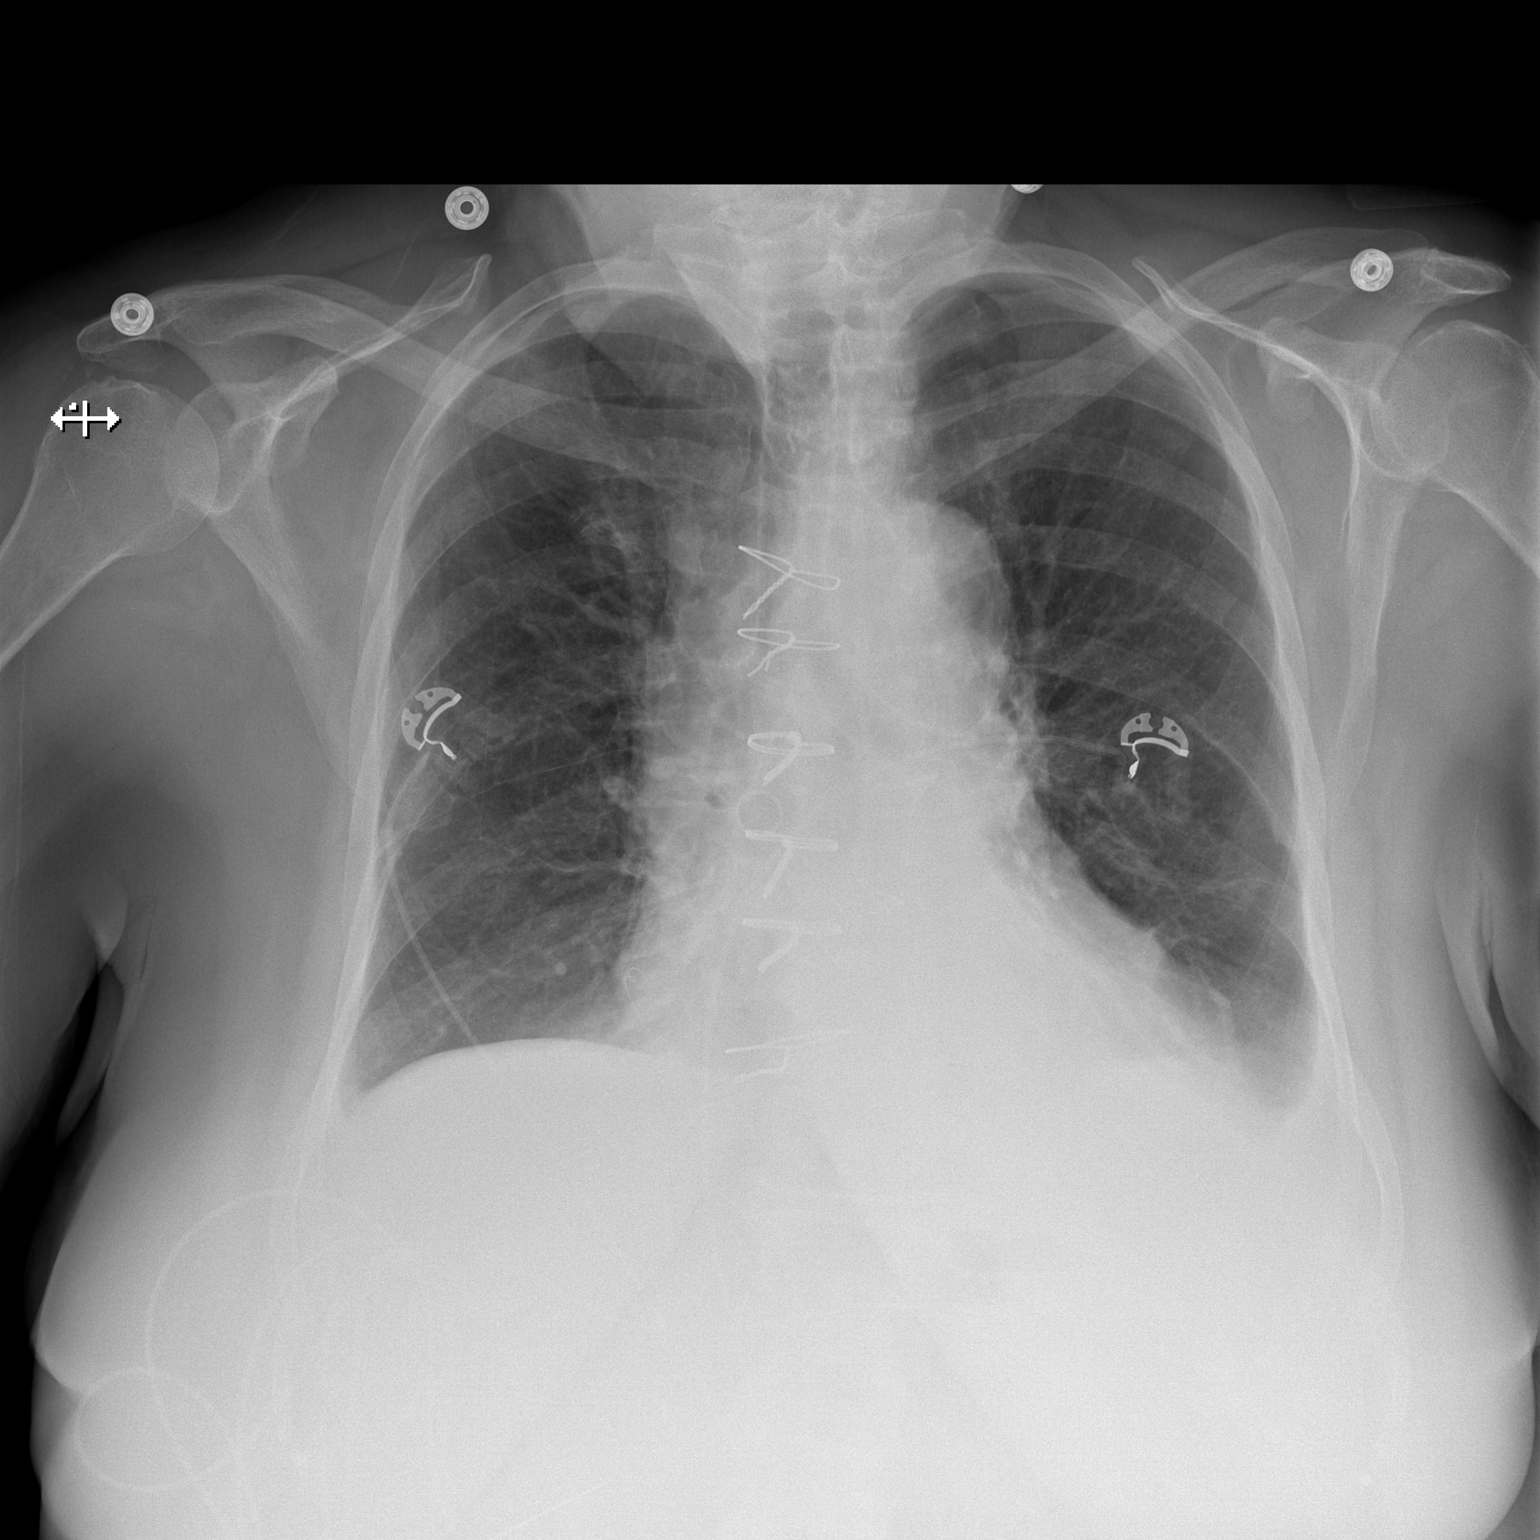

[w chest lat]
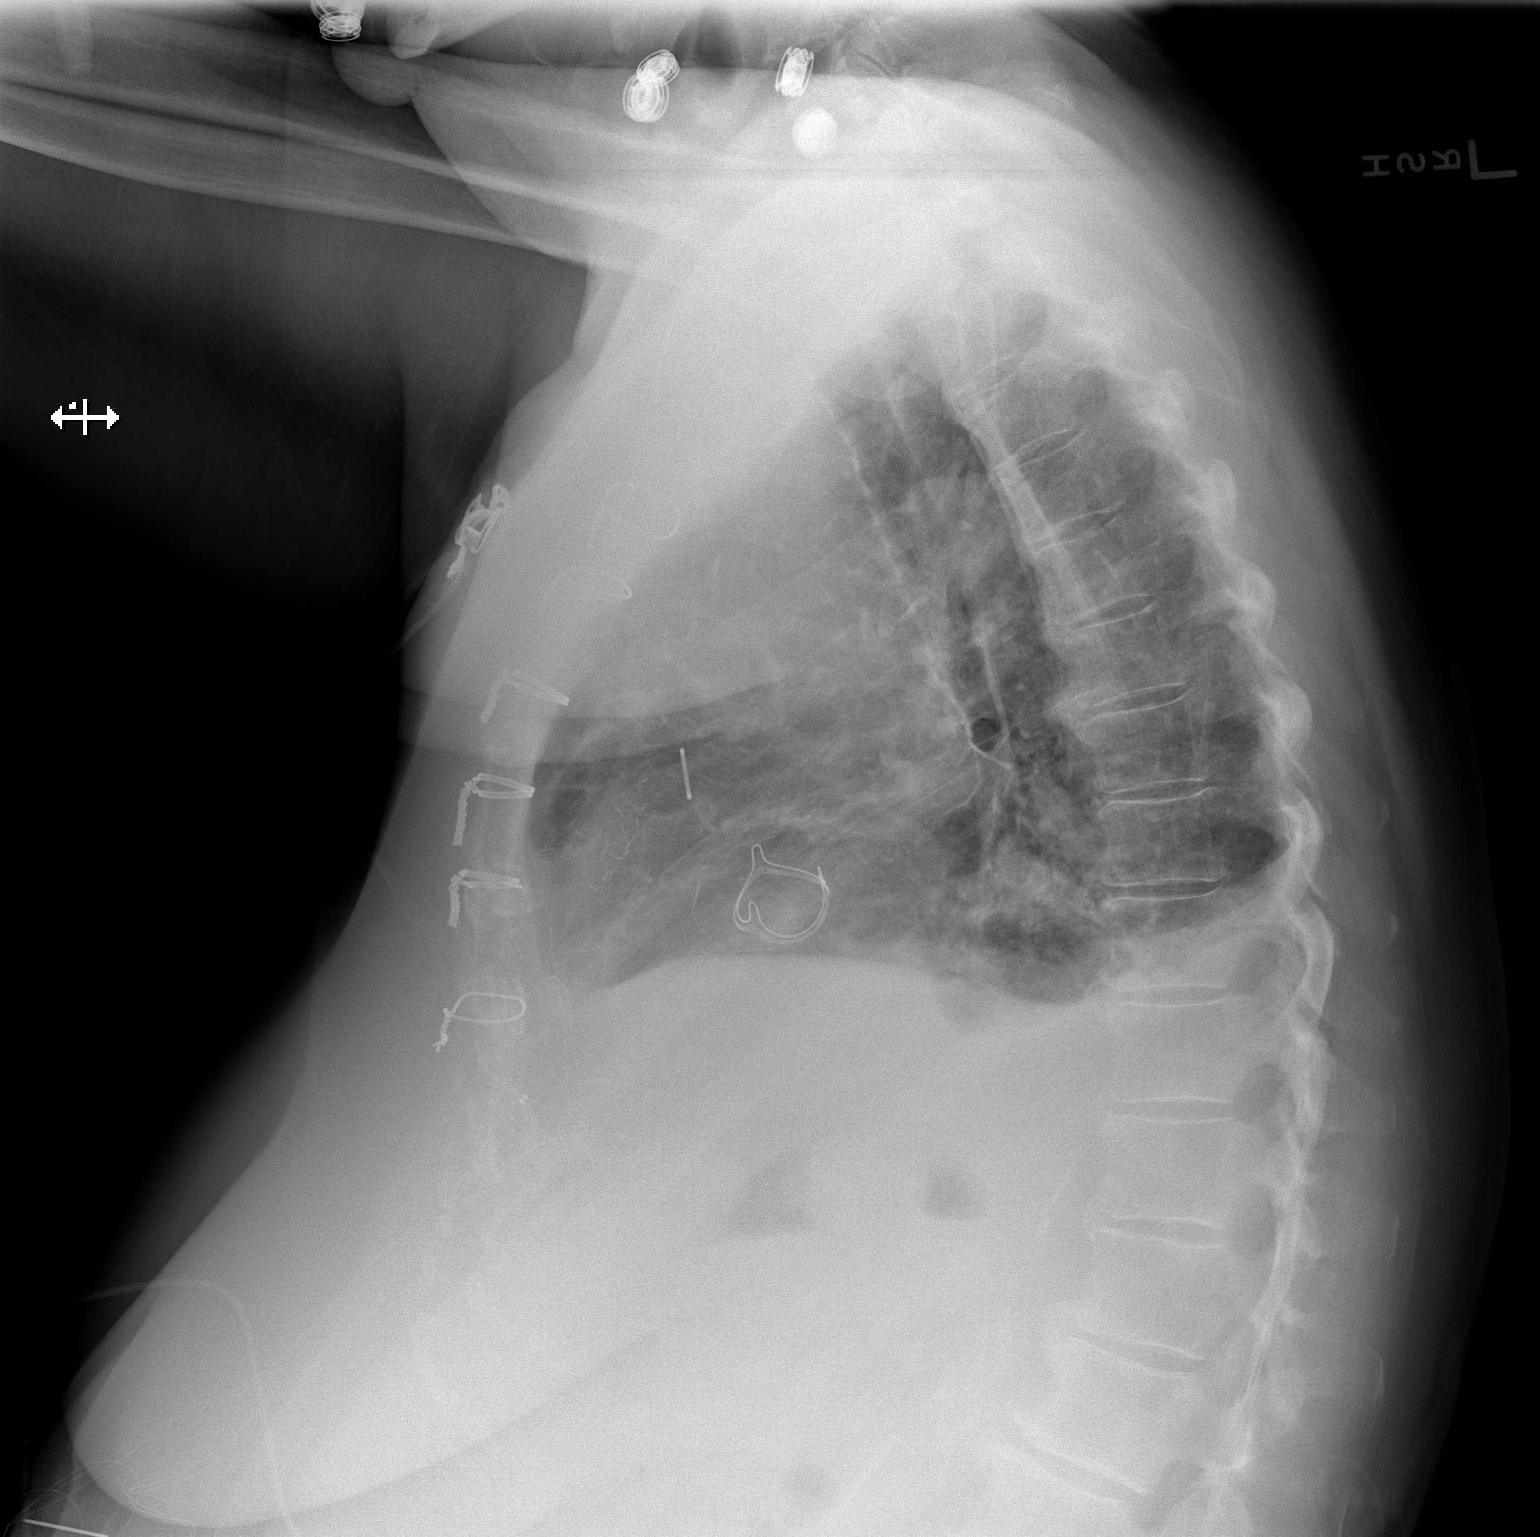

[2 of 2 positions shown; findings below may reference images not displayed]

FINDINGS: Heart size and mediastinal contours are stable. Median sternotomy
wires appear intact and stable in alignment. Small LEFT pleural
effusion with probable associated atelectasis. Lungs otherwise
clear. No pneumothorax seen. No acute appearing osseous abnormality.
IMPRESSION: 1. Small pleural effusion with probable mild atelectasis at the LEFT
lung base. Lungs otherwise clear.
2. No pneumothorax.

## 2019-06-28 DIAGNOSIS — S61012A Laceration without foreign body of left thumb without damage to nail, initial encounter: Secondary | ICD-10-CM | POA: Diagnosis not present

## 2019-07-03 DIAGNOSIS — C44319 Basal cell carcinoma of skin of other parts of face: Secondary | ICD-10-CM | POA: Diagnosis not present

## 2019-07-12 DIAGNOSIS — L570 Actinic keratosis: Secondary | ICD-10-CM | POA: Diagnosis not present

## 2019-07-12 DIAGNOSIS — L821 Other seborrheic keratosis: Secondary | ICD-10-CM | POA: Diagnosis not present

## 2019-07-12 DIAGNOSIS — L97921 Non-pressure chronic ulcer of unspecified part of left lower leg limited to breakdown of skin: Secondary | ICD-10-CM | POA: Diagnosis not present

## 2019-07-15 ENCOUNTER — Telehealth: Payer: Self-pay | Admitting: Cardiovascular Disease

## 2019-07-15 NOTE — Telephone Encounter (Signed)
Please hold Repatha for now and schedule lipid OV with pharmacist (next available) if patient agreeable.

## 2019-07-15 NOTE — Telephone Encounter (Signed)
  Pt c/o medication issue:  1. Name of Medication: Evolocumab (REPATHA SURECLICK) 559 MG/ML SOAJ  2. How are you currently taking this medication (dosage and times per day)? As directed  3. Are you having a reaction (difficulty breathing--STAT)?  See next question  4. What is your medication issue? Patient is experiencing joint pain, weight gain, diarrhea, dizziness and fatigue since being on the shots. She would like to see about changing to something else. She would also like to talk to the nurse about having her blood work done. Patient would also like to discuss some chest soreness she has. Denies chest pain.

## 2019-07-18 DIAGNOSIS — R0989 Other specified symptoms and signs involving the circulatory and respiratory systems: Secondary | ICD-10-CM | POA: Diagnosis not present

## 2019-07-18 DIAGNOSIS — E559 Vitamin D deficiency, unspecified: Secondary | ICD-10-CM | POA: Diagnosis not present

## 2019-07-18 DIAGNOSIS — Z0184 Encounter for antibody response examination: Secondary | ICD-10-CM | POA: Diagnosis not present

## 2019-07-18 DIAGNOSIS — I350 Nonrheumatic aortic (valve) stenosis: Secondary | ICD-10-CM | POA: Diagnosis not present

## 2019-07-18 DIAGNOSIS — E781 Pure hyperglyceridemia: Secondary | ICD-10-CM | POA: Diagnosis not present

## 2019-07-18 DIAGNOSIS — E782 Mixed hyperlipidemia: Secondary | ICD-10-CM | POA: Diagnosis not present

## 2019-07-18 DIAGNOSIS — E039 Hypothyroidism, unspecified: Secondary | ICD-10-CM | POA: Diagnosis not present

## 2019-07-18 DIAGNOSIS — R011 Cardiac murmur, unspecified: Secondary | ICD-10-CM | POA: Diagnosis not present

## 2019-07-18 DIAGNOSIS — I10 Essential (primary) hypertension: Secondary | ICD-10-CM | POA: Diagnosis not present

## 2019-07-18 DIAGNOSIS — R7309 Other abnormal glucose: Secondary | ICD-10-CM | POA: Diagnosis not present

## 2019-07-18 NOTE — Telephone Encounter (Signed)
Left message to call back  

## 2019-07-18 NOTE — Telephone Encounter (Signed)
Pt updated and message sent to scheduler to arrange lipid clinic appointment.

## 2019-07-18 NOTE — Telephone Encounter (Signed)
F/U Message          Patient is needing a call back concerning her "Repatha side effects. Pls call to advise 2nd call. Ok to leave a message on the answering machine.

## 2019-07-19 ENCOUNTER — Telehealth: Payer: Self-pay

## 2019-07-19 ENCOUNTER — Telehealth: Payer: Self-pay | Admitting: Cardiovascular Disease

## 2019-07-19 NOTE — Telephone Encounter (Signed)
Patient reports increase IBS-d  flare, increased fatigue, and dizziness after each Repatha dose.  Lipid profile and A1C completed yesterday with PCP.     *Instrcutions given for Repatha   HOLD dose due July/31  Re-challenge Aug/14  Re-dose Aug/28 if no SE noted after injection on 8/14  Appt with Lipid clinic scheduled for Sept/10 to discuss lipid management options as needed.

## 2019-07-19 NOTE — Telephone Encounter (Signed)
Spoke with pt who report since she had her surgery, her chest is still tender to touch. No redness or inflammation but just soreness. She report symptoms aren't new but she was just hoping it would get better. She denies SOB and state, that has actually gotten better. She also wanted to make PA aware, occasionally her BP runs a little higher than normal, in the 140's but couldn't give how often. Pt state she is going to track BP for a week and call back with results.

## 2019-07-19 NOTE — Telephone Encounter (Signed)
Called to schedule Lipid Clinic appointment with Pharm D as requested thru phone encounter.  Patient states she called regarding her issues with Repatha on Monday and did not receive a return call until Thursday.  She states her questions were not answered regarding the side effects from the [place] she has been having and would like answers prior to her appointment in [place] with the Pharm D.

## 2019-07-22 ENCOUNTER — Telehealth: Payer: Self-pay | Admitting: Cardiology

## 2019-07-22 NOTE — Telephone Encounter (Signed)
I called to discuss B/P medications- ? Did she resume Amlodipine as instructed in April. Left message TCB.  Kerin Ransom PA-C 07/22/2019 9:06 AM

## 2019-07-22 NOTE — Telephone Encounter (Signed)
I called and left a message for her TCB- see phone note.  Kerin Ransom PA-C 07/22/2019 9:07 AM

## 2019-07-23 NOTE — Telephone Encounter (Signed)
Please see updated encounter  

## 2019-07-23 NOTE — Telephone Encounter (Signed)
Patient called back returning Alisha's call  

## 2019-07-23 NOTE — Telephone Encounter (Signed)
Left message to call back  

## 2019-07-24 ENCOUNTER — Telehealth: Payer: Self-pay | Admitting: Cardiology

## 2019-07-24 MED ORDER — AMLODIPINE BESYLATE 5 MG PO TABS: 5.0000 mg | ORAL_TABLET | Freq: Every day | ORAL | 3 refills | Status: DC

## 2019-07-24 NOTE — Telephone Encounter (Signed)
Patient states she has at home bp cuff and she thinks it not accurate. And her daughter normally checks her bp but is working at a covid site and can not  Come to her house.   bp readings:  07/18/2019 127/68  Pulse:70 07/20/2019 119/61  Pulse:74 07/22/2019 123/81  pulse 75 07/22/2019 121/58 pulse 70 07/23/2019 128/70 pulse 79 07/24/2019 110/51 pulse 54  At drs office bp was 117 was the top number.  Informed patient that she should contact the surgeon who did her procedure to take a look at her.   Take 5mg  of Norvasc of daily.

## 2019-07-24 NOTE — Telephone Encounter (Signed)
See other phone note, the issue was addressed on phone note from 07/22/2019.

## 2019-07-24 NOTE — Telephone Encounter (Signed)
Lurena Joiner made aware of patient call.

## 2019-07-24 NOTE — Telephone Encounter (Signed)
Follow Up:      Pt says she is returning your call.

## 2019-07-24 NOTE — Addendum Note (Signed)
Addended by: Ulice Brilliant T on: 07/24/2019 04:59 PM   Modules accepted: Orders

## 2019-07-26 DIAGNOSIS — M25562 Pain in left knee: Secondary | ICD-10-CM | POA: Diagnosis not present

## 2019-07-26 DIAGNOSIS — M1712 Unilateral primary osteoarthritis, left knee: Secondary | ICD-10-CM | POA: Diagnosis not present

## 2019-07-26 DIAGNOSIS — M7062 Trochanteric bursitis, left hip: Secondary | ICD-10-CM | POA: Diagnosis not present

## 2019-07-26 DIAGNOSIS — M7060 Trochanteric bursitis, unspecified hip: Secondary | ICD-10-CM | POA: Diagnosis not present

## 2019-08-09 ENCOUNTER — Telehealth: Payer: Self-pay | Admitting: Cardiovascular Disease

## 2019-08-09 NOTE — Telephone Encounter (Signed)
Medication Samples have been provided to the patient.  Drug name: Repatha  SureClick   Strength: 140mg      Qty: 1     LOT: [id number] Exp.Date:10/22   The patient has been instructed regarding the correct time, dose, and frequency of taking this medication, including desired effects and most common side effects.    Elbert Polyakov Rodriguez-Guzman PharmD, BCPS, [place] [id number] 08/09/2019 1:03 PM

## 2019-08-09 NOTE — Telephone Encounter (Signed)
New Message   Patient calling the office for samples of medication:   1.  What medication and dosage are you requesting samples for?Evolocumab (REPATHA SURECLICK) 156 MG/ML SOAJ    2.  Are you currently out of this medication? yes

## 2019-08-09 NOTE — Telephone Encounter (Signed)
Do we do samples of this? Thanks!

## 2019-08-12 DIAGNOSIS — Z87891 Personal history of nicotine dependence: Secondary | ICD-10-CM | POA: Diagnosis not present

## 2019-08-12 DIAGNOSIS — I10 Essential (primary) hypertension: Secondary | ICD-10-CM | POA: Diagnosis not present

## 2019-09-04 DIAGNOSIS — Z85828 Personal history of other malignant neoplasm of skin: Secondary | ICD-10-CM | POA: Diagnosis not present

## 2019-09-04 DIAGNOSIS — L821 Other seborrheic keratosis: Secondary | ICD-10-CM | POA: Diagnosis not present

## 2019-09-04 DIAGNOSIS — Z808 Family history of malignant neoplasm of other organs or systems: Secondary | ICD-10-CM | POA: Diagnosis not present

## 2019-09-04 DIAGNOSIS — D2271 Melanocytic nevi of right lower limb, including hip: Secondary | ICD-10-CM | POA: Diagnosis not present

## 2019-09-04 DIAGNOSIS — L309 Dermatitis, unspecified: Secondary | ICD-10-CM | POA: Diagnosis not present

## 2019-09-05 ENCOUNTER — Other Ambulatory Visit: Payer: Self-pay

## 2019-09-05 ENCOUNTER — Ambulatory Visit (INDEPENDENT_AMBULATORY_CARE_PROVIDER_SITE_OTHER): Payer: PPO | Admitting: Pharmacist

## 2019-09-05 VITALS — BP 142/82 | HR 73 | Resp 15 | Ht 65.5 in | Wt 226.4 lb

## 2019-09-05 DIAGNOSIS — E785 Hyperlipidemia, unspecified: Secondary | ICD-10-CM | POA: Diagnosis not present

## 2019-09-05 MED ORDER — ATORVASTATIN CALCIUM 10 MG PO TABS: 10.0000 mg | ORAL_TABLET | Freq: Every day | ORAL | 0 refills | Status: DC

## 2019-09-05 NOTE — Progress Notes (Signed)
Patient ID: [name] [name]                 DOB: August 18, 1946                    MRN: [id number]     HPI: [name] [name] is a 73 y.o. female patient referred to lipid clinic by Dr Gwenlyn Found. PMH is significant for hypertension, dyslipidemia, statin intolerance, osteoarthritis, aortic stenosis, migraine, and IBS. She continues to use Repatha SureClick 140mg  every 14 days but reports  Difficulty using the PEN due to arthritis in her hands and an increase in migraine aura 3-4 times while on Repatha. IBS usually fares-up the day after repatha injection but not too uncomfortable for patient. She presents to clinic to discuss alterative to current lipid management.   Current Medications:  Repatha SureClick 140mg  every 14 days - on HOLD while traveling, due for dose this week  Intolerances:  Atorvastatin 10mg  Rosuvastatin 5mg   LDL goal: < 70mg /dL or 50% reduction from baseline  Diet: low fat diet  Exercise: activities of daily living  Family History: includes Heart disease in her father and mother. There is no history of Colon cancer.  Social History: former smoker, denies alcohol use  Labs: 02/15/2019: CHO 159; TG 212; HDL 45; LDL-c 72mg /dL (Repatha 140mg ) 45% decrease 09/05/2018:CHO 229; TG 284; HDL 41; LDL-c 131 (no therapy)  Past Medical History:  Diagnosis Date  . Aortic stenosis, severe 08/2018   progressed from moderate by ECHO 01-03-17   . Arthritis   . Cancer ([place])    basal cell skin cancer  . Carotid artery bruit   . Claustrophobia   . Coronary artery disease   . Diverticulosis   . Family history of heart disease   . GERD (gastroesophageal reflux disease)   . Gout   . History of hiatal hernia   . History of kidney stones   . Hyperlipemia   . Hypertension   . Irritable bowel syndrome   . Thyroid disease   . UTI (urinary tract infection) 03/09/14    Current Outpatient Medications on File Prior to Visit  Medication Sig Dispense Refill  . allopurinol (ZYLOPRIM) 100 MG tablet  Take 1 tablet (100 mg total) by mouth daily. 30 tablet 0  . amLODipine (NORVASC) 5 MG tablet Take 1 tablet (5 mg total) by mouth daily. 90 tablet 3  . aspirin 81 MG EC tablet Take 1 tablet (81 mg total) by mouth daily.    . cephALEXin (KEFLEX) 500 MG capsule Take all 4 tablets one hour prior to dental procedure 4 capsule 6  . Cholecalciferol (VITAMIN D) 2000 units tablet Take 1 tablet by mouth daily.    Marland Kitchen dicyclomine (BENTYL) 10 MG capsule Take 10 mg by mouth 4 (four) times daily as needed for spasms.     . hydrochlorothiazide (MICROZIDE) 12.5 MG capsule Take 1 capsule (12.5 mg total) by mouth daily. 90 capsule 3  . lansoprazole (PREVACID) 30 MG capsule Take 30 mg by mouth daily as needed (heartburn).     Marland Kitchen levothyroxine (SYNTHROID, LEVOTHROID) 125 MCG tablet Take 100 mcg by mouth daily before breakfast.     . losartan (COZAAR) 50 MG tablet Take 1 tablet (50 mg total) by mouth daily. 90 tablet 0  . meclizine (ANTIVERT) 25 MG tablet Take 25 mg by mouth 3 (three) times daily as needed for dizziness.     . metoprolol tartrate (LOPRESSOR) 25 MG tablet Take 1 tablet (25 mg total) by  mouth 2 (two) times daily. 180 tablet 3  . Propylene Glycol (SYSTANE BALANCE) 0.6 % SOLN Place 1 drop into both eyes 2 (two) times daily as needed (dry eyes).    . traMADol (ULTRAM) 50 MG tablet Take 50 mg by mouth every 4-6 hours PRN severe pain 30 tablet 0  . trimethoprim (TRIMPEX) 100 MG tablet Take 100 mg by mouth daily as needed (For UTI Prevention).     . Evolocumab (REPATHA SURECLICK) [id number] MG/ML SOAJ Inject 140 mg into the skin every 14 (fourteen) days. (Patient not taking: Reported on 09/05/2019) 2 pen 12   No current facility-administered medications on file prior to visit.     Allergies  Allergen Reactions  . Amoxicillin Rash    Has patient had a PCN reaction causing immediate rash, facial/tongue/throat swelling, SOB or lightheadedness with hypotension: No Has patient had a PCN reaction causing severe rash  involving mucus membranes or skin necrosis: No Has patient had a PCN reaction that required hospitalization: No Has patient had a PCN reaction occurring within the last 10 years: No If all of the above answers are "NO", then may proceed with Cephalosporin use.    Marland Kitchen Amoxicillin-Pot Clavulanate Rash    Has patient had a PCN reaction causing immediate rash, facial/tongue/throat swelling, SOB or lightheadedness with hypotension: No Has patient had a PCN reaction causing severe rash involving mucus membranes or skin necrosis: No Has patient had a PCN reaction that required hospitalization: No Has patient had a PCN reaction occurring within the last 10 years: No If all of the above answers are "NO", then may proceed with Cephalosporin use.   . Ampicillin Rash  . Clarithromycin Rash  . Clopidogrel Bisulfate Rash  . Nitrofurantoin Itching  . Plavix [Clopidogrel Bisulfate] Rash  . Zestril [Lisinopril] Rash    Dyslipidemia, goal LDL below 70 Noted patient continues to have compliance issues with Repatha. Every 14 days dose is challenging due to dexterity problems caused by severe arthritis. Patient also reports increased frequency on migraine aura and IBS flare ups immediately after Repatha dose given.  We discussed option of Repatha 420mg  infusion once per month, low dose Praluent and Nexletol 180mg  daily. She si NOT a candidate for Nexletol due to history of gout. Patient not interested in Praluent 75mg  at this time either. Will like trial with Repatha 420mg  infusion to improve administration,and  decrease frequency. Sample of [place] 420mg  infusion was provided today (Lot OR:[id number], Exp 08/21). She will tested at home in 1 week and call back to request prescription if able to tolerate.     Stephaun Million Rodriguez-Guzman PharmD, BCPS, [place] [address] [place], [id number] 09/09/2019 5:30 PM

## 2019-09-05 NOTE — Patient Instructions (Addendum)
Lipid Clinic (pharmacist) Wash Nienhaus/Kristin [phone]  *Call back to report if will use Repatha 420mg  infusion, Repatha SureClick 140mg , or Praluent 75mg * *Demo Video - [email or web address] --> How to take Repatha--> Watch Demo Video*   High Cholesterol  High cholesterol is a condition in which the blood has high levels of a white, waxy, fat-like substance (cholesterol). The human body needs small amounts of cholesterol. The liver makes all the cholesterol that the body needs. Extra (excess) cholesterol comes from the food that we eat. Cholesterol is carried from the liver by the blood through the blood vessels. If you have high cholesterol, deposits (plaques) may build up on the walls of your blood vessels (arteries). Plaques make the arteries narrower and stiffer. Cholesterol plaques increase your risk for heart attack and stroke. Work with your health care provider to keep your cholesterol levels in a healthy range. What increases the risk? This condition is more likely to develop in people who:  Eat foods that are high in animal fat (saturated fat) or cholesterol.  Are overweight.  Are not getting enough exercise.  Have a family history of high cholesterol. What are the signs or symptoms? There are no symptoms of this condition. How is this diagnosed? This condition may be diagnosed from the results of a blood test.  If you are older than age 109, your health care provider may check your cholesterol every 4-6 years.  You may be checked more often if you already have high cholesterol or other risk factors for heart disease. The blood test for cholesterol measures:  "Bad" cholesterol (LDL cholesterol). This is the main type of cholesterol that causes heart disease. The desired level for LDL is less than 100.  "Good" cholesterol (HDL cholesterol). This type helps to protect against heart disease by cleaning the arteries and carrying the LDL away. The desired level for HDL is 60 or higher.   Triglycerides. These are fats that the body can store or burn for energy. The desired number for triglycerides is lower than 150.  Total cholesterol. This is a measure of the total amount of cholesterol in your blood, including LDL cholesterol, HDL cholesterol, and triglycerides. A healthy number is less than 200. How is this treated? This condition is treated with diet changes, lifestyle changes, and medicines. Diet changes  This may include eating more whole grains, fruits, vegetables, nuts, and fish.  This may also include cutting back on red meat and foods that have a lot of added sugar. Lifestyle changes  Changes may include getting at least 40 minutes of aerobic exercise 3 times a week. Aerobic exercises include walking, biking, and swimming. Aerobic exercise along with a healthy diet can help you maintain a healthy weight.  Changes may also include quitting smoking. Medicines  Medicines are usually given if diet and lifestyle changes have failed to reduce your cholesterol to healthy levels.  Your health care provider may prescribe a statin medicine. Statin medicines have been shown to reduce cholesterol, which can reduce the risk of heart disease. Follow these instructions at home: Eating and drinking If told by your health care provider:  Eat chicken (without skin), fish, veal, shellfish, ground [place] breast, and round or loin cuts of red meat.  Do not eat fried foods or fatty meats, such as hot dogs and salami.  Eat plenty of fruits, such as apples.  Eat plenty of vegetables, such as broccoli, potatoes, and carrots.  Eat beans, peas, and lentils.  Eat grains such as barley, rice, couscous,  and bulgur wheat.  Eat pasta without cream sauces.  Use skim or nonfat milk, and eat low-fat or nonfat yogurt and cheeses.  Do not eat or drink whole milk, cream, ice cream, egg yolks, or hard cheeses.  Do not eat stick margarine or tub margarines that contain trans fats (also  called partially hydrogenated oils).  Do not eat saturated tropical oils, such as coconut oil and palm oil.  Do not eat cakes, cookies, crackers, or other baked goods that contain trans fats.  General instructions  Exercise as directed by your health care provider. Increase your activity level with activities such as gardening, walking, and taking the stairs.  Take over-the-counter and prescription medicines only as told by your health care provider.  Do not use any products that contain nicotine or tobacco, such as cigarettes and e-cigarettes. If you need help quitting, ask your health care provider.  Keep all follow-up visits as told by your health care provider. This is important. Contact a health care provider if:  You are struggling to maintain a healthy diet or weight.  You need help to start on an exercise program.  You need help to stop smoking. Get help right away if:  You have chest pain.  You have trouble breathing. This information is not intended to replace advice given to you by your health care provider. Make sure you discuss any questions you have with your health care provider. Document Released: 12/12/2005 Document Revised: 12/15/2017 Document Reviewed: 06/11/2016 Elsevier Patient Education  2020 [employer].

## 2019-09-06 ENCOUNTER — Telehealth: Payer: Self-pay | Admitting: Cardiovascular Disease

## 2019-09-06 NOTE — Telephone Encounter (Signed)
Called and spoke to PharmD to clarify dose on Metoprolol. No questions.

## 2019-09-06 NOTE — Telephone Encounter (Signed)
Pt c/o medication issue:  1. Name of Medication: METOPROLOL   2. How are you currently taking this medication (dosage and times per day)? Pharm called has a question about that? KATHRYN  3. Are you having a reaction (difficulty breathing--STAT)? No   4. What is your medication issue? DOSAGE ?

## 2019-09-09 ENCOUNTER — Encounter: Payer: Self-pay | Admitting: Pharmacist

## 2019-09-09 NOTE — Assessment & Plan Note (Signed)
Noted patient continues to have compliance issues with Repatha. Every 14 days dose is challenging due to dexterity problems caused by severe arthritis. Patient also reports increased frequency on migraine aura and IBS flare ups immediately after Repatha dose given.  We discussed option of Repatha 420mg  infusion once per month, low dose Praluent and Nexletol 180mg  daily. She si NOT a candidate for Nexletol due to history of gout. Patient not interested in Praluent 75mg  at this time either. Will like trial with Repatha 420mg  infusion to improve administration,and  decrease frequency. Sample of Repatha 420mg  infusion was provided today (Lot OR:[id number], Exp 08/21). She will tested at home in 1 week and call back to request prescription if able to tolerate.

## 2019-09-11 ENCOUNTER — Other Ambulatory Visit: Payer: Self-pay

## 2019-09-11 ENCOUNTER — Ambulatory Visit (INDEPENDENT_AMBULATORY_CARE_PROVIDER_SITE_OTHER): Payer: PPO | Admitting: Cardiovascular Disease

## 2019-09-11 ENCOUNTER — Encounter: Payer: Self-pay | Admitting: Cardiovascular Disease

## 2019-09-11 DIAGNOSIS — Z951 Presence of aortocoronary bypass graft: Secondary | ICD-10-CM | POA: Diagnosis not present

## 2019-09-11 DIAGNOSIS — Z953 Presence of xenogenic heart valve: Secondary | ICD-10-CM | POA: Diagnosis not present

## 2019-09-11 DIAGNOSIS — E7800 Pure hypercholesterolemia, unspecified: Secondary | ICD-10-CM | POA: Diagnosis not present

## 2019-09-11 DIAGNOSIS — I712 Thoracic aortic aneurysm, without rupture, unspecified: Secondary | ICD-10-CM

## 2019-09-11 DIAGNOSIS — I10 Essential (primary) hypertension: Secondary | ICD-10-CM | POA: Diagnosis not present

## 2019-09-11 NOTE — Assessment & Plan Note (Addendum)
History of essential hypertension with blood pressure measured today at 126/78.  She is on amlodipine, losartan and metoprolol and hydrochlorothiazide.

## 2019-09-11 NOTE — Assessment & Plan Note (Addendum)
History of hyperlipidemia on statin therapy as well as Repatha with recent lipid profile performed by her PCP 07/23/2019 Total cholesterol 141, LDL 49, and HDL 41.  Her triglyceride level was 254 although she is had problems with tried hypertriglyceridemia in the past as well.

## 2019-09-11 NOTE — Progress Notes (Signed)
09/11/2019 [name] [name]   1946/11/10  MR:[id number]  Primary Physician Jamesetta Orleans, PA-C Primary Cardiologist: Lorretta Harp MD FACP, [place], [place], [place]  HPI:  [name] [name] is a 73 y.o.  mildly overweight married Caucasian female para 3, her mother to 17 grandchildrenwho I last saw in the office  01/15/2019.Is accompanied by her daughter [name] today. Shewasself-referred to be established in our practice, a friend of hers is mypatient. 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 Clyda Hurdle. Her cardiovascular risk factor profile is notable for family history of heart disease with both parents died of myocardial infarctions in their 28s and 28s and a brother who died of an MI at 73. She has never had a heart attack or a clinical stroke. Risk factors otherwise remarkable for treated hypertension and hyperlipidemia. She does have GERD. She had negative nuclear stress test and [facility]. She does have a history of a murmurand had a recent 2-D echo performed 01/30/18 revealing progression of her aortic stenosis now with a peak gradient of 70 and a valve area 1.17 cm. She does complain of some dyspnea on exertion. She denies chest pain. Because of symptoms of dyspnea as well as a 2D echo that revealed severe lAS.I performed outpatient cardiac cath on her 09/10/2018 revealing a total mid RCA with high-grade proximal LAD disease and severe aortic stenosis.  She ultimately underwent CABG x2 with a LIMA to her LAD and a vein to the PDA as well as pericardial tissue aortic valve replacement by Dr. Roxan Hockey 09/24/2018 and was discharged a week later.  She is recuperating nicely.  Her symptoms preoperative dyspnea have resolved.  Since I saw her 8 months ago she continues to do well.  She had a 2D echo performed 01/21/2019 that  revealed normal LV systolic function with a well-functioning aortic bioprosthesis.  The thoracic aorta measured 4.7 cm on that study and therefore follow-up CT of her chest was done to confirm this which only showed the aortic dimension of 41 mm.  She still still complains of some shortness of breath but markedly improved since her aortic valve replacement.  She recently returned from [facility] where she was on vacation visiting her family (daughter as an Electronics engineer there).    Current Meds  Medication Sig   allopurinol (ZYLOPRIM) 100 MG tablet Take 1 tablet (100 mg total) by mouth daily.   amLODipine (NORVASC) 5 MG tablet Take 1 tablet (5 mg total) by mouth daily. (Patient taking differently: Take 5 mg by mouth daily. Patient takes 10 mg prn.)   aspirin 81 MG EC tablet Take 1 tablet (81 mg total) by mouth daily.   atorvastatin (LIPITOR) 10 MG tablet Take 1 tablet (10 mg total) by mouth daily. As needed   cephALEXin (KEFLEX) 500 MG capsule Take all 4 tablets one hour prior to dental procedure   Cholecalciferol (VITAMIN D) 2000 units tablet Take 1 tablet by mouth daily.   dicyclomine (BENTYL) 10 MG capsule Take 10 mg by mouth 4 (four) times daily as needed for spasms.    Evolocumab (REPATHA SURECLICK) [id number] MG/ML SOAJ Inject 140 mg into the skin every 14 (fourteen) days.   lansoprazole (PREVACID) 30 MG capsule Take 30 mg by mouth daily as needed (heartburn).    levothyroxine (SYNTHROID, LEVOTHROID) 125 MCG tablet Take 100  mcg by mouth daily before breakfast.    losartan (COZAAR) 50 MG tablet Take 1 tablet (50 mg total) by mouth daily.   meclizine (ANTIVERT) 25 MG tablet Take 25 mg by mouth 3 (three) times daily as needed for dizziness.    metoprolol tartrate (LOPRESSOR) 25 MG tablet Take 1 tablet (25 mg total) by mouth 2 (two) times daily.   Propylene Glycol (SYSTANE BALANCE) 0.6 % SOLN Place 1 drop into both eyes 2 (two) times daily as needed (dry eyes).   traMADol  (ULTRAM) 50 MG tablet Take 50 mg by mouth every 4-6 hours PRN severe pain   trimethoprim (TRIMPEX) 100 MG tablet Take 100 mg by mouth daily as needed (For UTI Prevention).      Allergies  Allergen Reactions   Amoxicillin Rash    Has patient had a PCN reaction causing immediate rash, facial/tongue/throat swelling, SOB or lightheadedness with hypotension: No Has patient had a PCN reaction causing severe rash involving mucus membranes or skin necrosis: No Has patient had a PCN reaction that required hospitalization: No Has patient had a PCN reaction occurring within the last 10 years: No If all of the above answers are "NO", then may proceed with Cephalosporin use.     Amoxicillin-Pot Clavulanate Rash    Has patient had a PCN reaction causing immediate rash, facial/tongue/throat swelling, SOB or lightheadedness with hypotension: No Has patient had a PCN reaction causing severe rash involving mucus membranes or skin necrosis: No Has patient had a PCN reaction that required hospitalization: No Has patient had a PCN reaction occurring within the last 10 years: No If all of the above answers are "NO", then may proceed with Cephalosporin use.    Ampicillin Rash   Clarithromycin Rash   Clopidogrel Bisulfate Rash   Nitrofurantoin Itching   Plavix [Clopidogrel Bisulfate] Rash   Zestril [Lisinopril] Rash    Social History   Socioeconomic History   Marital status: Widowed    Spouse name: Not on file   Number of children: Not on file   Years of education: Not on file   Highest education level: Not on file  Occupational History   Occupation: Retired  Scientist, product/process development strain: Not on file   Food insecurity    Worry: Not on file    Inability: Not on Lexicographer needs    Medical: No    Non-medical: No  Tobacco Use   Smoking status: Former Smoker    Packs/day: 0.80    Years: 20.00    Pack years: 16.00    Types: Cigarettes    Quit date:  09/21/1987    Years since quitting: 31.9   Smokeless tobacco: Never Used   Tobacco comment: socially  Substance and Sexual Activity   Alcohol use: No   Drug use: No   Sexual activity: Not on file  Lifestyle   Physical activity    Days per week: 0 days    Minutes per session: 0 min   Stress: Only a little  Relationships   Social connections    Talks on phone: Not on file    Gets together: Not on file    Attends religious service: Not on file    Active member of club or organization: Not on file    Attends meetings of clubs or organizations: Not on file    Relationship status: Not on file   Intimate partner violence    Fear of current or ex partner: Not on  file    Emotionally abused: Not on file    Physically abused: Not on file    Forced sexual activity: Not on file  Other Topics Concern   Not on file  Social History Narrative   Not on file     Review of Systems: General: negative for chills, fever, night sweats or weight changes.  Cardiovascular: negative for chest pain, dyspnea on exertion, edema, orthopnea, palpitations, paroxysmal nocturnal dyspnea or shortness of breath Dermatological: negative for rash Respiratory: negative for cough or wheezing Urologic: negative for hematuria Abdominal: negative for nausea, vomiting, diarrhea, bright red blood per rectum, melena, or hematemesis Neurologic: negative for visual changes, syncope, or dizziness All other systems reviewed and are otherwise negative except as noted above.    Blood pressure 126/78, pulse 93, temperature (!) 95.9 F (35.5 C), height 5' 5.5" (1.664 m), weight 223 lb (101.2 kg), SpO2 97 %.  General appearance: alert and no distress Neck: no adenopathy, no carotid bruit, no JVD, supple, symmetrical, trachea midline and thyroid not enlarged, symmetric, no tenderness/mass/nodules Lungs: clear to auscultation bilaterally Heart: regular rate and rhythm, S1, S2 normal, no murmur, click, rub or  gallop Extremities: extremities normal, atraumatic, no cyanosis or edema Pulses: 2+ and symmetric Skin: Skin color, texture, turgor normal. No rashes or lesions Neurologic: Alert and oriented X 3, normal strength and tone. Normal symmetric reflexes. Normal coordination and gait  EKG sinus rhythm at 75 without ST or T wave changes.  There were occasional PVCs.  I personally reviewed this EKG.  ASSESSMENT AND PLAN:   HYPERCHOLESTEROLEMIA History of hyperlipidemia on statin therapy as well as Repatha with recent lipid profile performed by her PCP 07/23/2019 Total cholesterol 141, LDL 49, and HDL 41.  Her triglyceride level was 254 although she is had problems with tried hypertriglyceridemia in the past as well.  Essential hypertension History of essential hypertension with blood pressure measured today at 126/78.  She is on amlodipine, losartan and metoprolol and hydrochlorothiazide.  S/P CABG x 2 History of CAD status post CABG times 27 August 2018 with a LIMA to the LAD and a vein to a PDA at the time of bioprosthetic aortic valve replacement performed by Dr. Roxan Hockey 09/24/2018.  S/P aortic valve replacement with bioprosthetic valve Status post aortic valve replacement for severe aortic stenosis by Dr. Roxan Hockey 09/24/2018.  Follow-up 2D echo performed 01/21/2027 revealed normal LV systolic function with a well-functioning aortic bioprosthesis.  The echo at that time did reveal a thoracic aorta measuring 4.7 cm however follow-up CTA performed 02/19/2019 only suggested that this measured 41 mm.  Thoracic aortic aneurysm (HCC) Small thoracic aortic aneurysm by CTA 02/19/2019 measuring 41 mm.  This will be repeated on an annual basis.      Lorretta Harp MD FACP,FACC,FAHA, [facility] 09/11/2019 11:55 AM

## 2019-09-11 NOTE — Assessment & Plan Note (Signed)
History of CAD status post CABG times 27 August 2018 with a LIMA to the LAD and a vein to a PDA at the time of bioprosthetic aortic valve replacement performed by Dr. Roxan Hockey 09/24/2018.

## 2019-09-11 NOTE — Assessment & Plan Note (Signed)
Status post aortic valve replacement for severe aortic stenosis by Dr. Roxan Hockey 09/24/2018.  Follow-up 2D echo performed 01/21/2027 revealed normal LV systolic function with a well-functioning aortic bioprosthesis.  The echo at that time did reveal a thoracic aorta measuring 4.7 cm however follow-up CTA performed 02/19/2019 only suggested that this measured 41 mm.

## 2019-09-11 NOTE — Patient Instructions (Signed)
Medication Instructions:   If you need a refill on your cardiac medications before your next appointment, please call your pharmacy.    Testing/Procedures: Your physician has requested that you have an echocardiogram. Echocardiography is a painless test that uses sound waves to create images of your heart. It provides your doctor with information about the size and shape of your heart and how well your heart's chambers and valves are working. This procedure takes approximately one hour. There are no restrictions for this procedure. Location: [facility]: [place], [place], [place] [address] [place] has requested that you have a thoracic CTA. Non-Cardiac CT Angiography (CTA), is a special type of CT scan that uses a computer to produce multi-dimensional views of major blood vessels throughout the body. In CT angiography, a contrast material is injected through an IV to help visualize the blood vessels DUE February 2021  Follow-Up: At [facility], you and your health needs are our priority.  As part of our continuing mission to provide you with exceptional heart care, we have created designated Provider Care Teams.  These Care Teams include your primary Cardiologist (physician) and Advanced Practice Providers (APPs -  Physician Assistants and Nurse Practitioners) who all work together to provide you with the care you need, when you need it. . You will need a follow up appointment in 12 months with Dr. Quay Burow.  Please call our office 2 months in advance to schedule this/each appointment.

## 2019-09-11 NOTE — Assessment & Plan Note (Signed)
Small thoracic aortic aneurysm by CTA 02/19/2019 measuring 41 mm.  This will be repeated on an annual basis.

## 2019-09-23 ENCOUNTER — Ambulatory Visit (HOSPITAL_BASED_OUTPATIENT_CLINIC_OR_DEPARTMENT_OTHER): Payer: PPO

## 2019-09-25 DIAGNOSIS — Z23 Encounter for immunization: Secondary | ICD-10-CM | POA: Diagnosis not present

## 2019-10-03 DIAGNOSIS — D3131 Benign neoplasm of right choroid: Secondary | ICD-10-CM | POA: Diagnosis not present

## 2019-10-03 DIAGNOSIS — Z961 Presence of intraocular lens: Secondary | ICD-10-CM | POA: Diagnosis not present

## 2019-10-03 DIAGNOSIS — D3132 Benign neoplasm of left choroid: Secondary | ICD-10-CM | POA: Diagnosis not present

## 2019-10-03 DIAGNOSIS — H1045 Other chronic allergic conjunctivitis: Secondary | ICD-10-CM | POA: Diagnosis not present

## 2019-10-03 DIAGNOSIS — H04123 Dry eye syndrome of bilateral lacrimal glands: Secondary | ICD-10-CM | POA: Diagnosis not present

## 2019-10-03 DIAGNOSIS — G43101 Migraine with aura, not intractable, with status migrainosus: Secondary | ICD-10-CM | POA: Diagnosis not present

## 2019-10-03 DIAGNOSIS — H18513 Endothelial corneal dystrophy, bilateral: Secondary | ICD-10-CM | POA: Diagnosis not present

## 2019-10-25 ENCOUNTER — Telehealth: Payer: Self-pay | Admitting: Cardiovascular Disease

## 2019-10-25 NOTE — Telephone Encounter (Signed)
Reviewed appts. Pt has echo 12/2019 and CT angio chest aorta 01/2020.  Attempted call to pt. Unable to reach. DPR on file. Left detailed message stating that pt has upcoming CT angio chest 02/05/2020 at 10:00am and part of CT angio chest aorta will include visualization of the lungs. Advised pt call back if needed

## 2019-10-25 NOTE — Telephone Encounter (Signed)
  Patient is calling because she is having a CT in January and she wants to know if she can get the CT on her lungs also. She states she had open heart surgery a year ago and they told her then that she would need to have the CT of her lungs in a year.

## 2019-10-28 DIAGNOSIS — R011 Cardiac murmur, unspecified: Secondary | ICD-10-CM | POA: Diagnosis not present

## 2019-10-28 DIAGNOSIS — R7309 Other abnormal glucose: Secondary | ICD-10-CM | POA: Diagnosis not present

## 2019-10-28 DIAGNOSIS — I10 Essential (primary) hypertension: Secondary | ICD-10-CM | POA: Diagnosis not present

## 2019-10-28 DIAGNOSIS — I350 Nonrheumatic aortic (valve) stenosis: Secondary | ICD-10-CM | POA: Diagnosis not present

## 2019-10-28 DIAGNOSIS — R82998 Other abnormal findings in urine: Secondary | ICD-10-CM | POA: Diagnosis not present

## 2019-10-28 DIAGNOSIS — E039 Hypothyroidism, unspecified: Secondary | ICD-10-CM | POA: Diagnosis not present

## 2019-10-28 DIAGNOSIS — M159 Polyosteoarthritis, unspecified: Secondary | ICD-10-CM | POA: Diagnosis not present

## 2019-10-28 DIAGNOSIS — E782 Mixed hyperlipidemia: Secondary | ICD-10-CM | POA: Diagnosis not present

## 2019-10-28 DIAGNOSIS — L989 Disorder of the skin and subcutaneous tissue, unspecified: Secondary | ICD-10-CM | POA: Diagnosis not present

## 2019-10-28 DIAGNOSIS — E559 Vitamin D deficiency, unspecified: Secondary | ICD-10-CM | POA: Diagnosis not present

## 2019-10-28 DIAGNOSIS — R3915 Urgency of urination: Secondary | ICD-10-CM | POA: Diagnosis not present

## 2019-11-07 DIAGNOSIS — R82998 Other abnormal findings in urine: Secondary | ICD-10-CM | POA: Diagnosis not present

## 2019-12-04 ENCOUNTER — Telehealth: Payer: Self-pay | Admitting: Cardiovascular Disease

## 2019-12-04 MED ORDER — ROSUVASTATIN CALCIUM 10 MG PO TABS: ORAL_TABLET | ORAL | 1 refills | Status: DC

## 2019-12-04 NOTE — Telephone Encounter (Signed)
Pt c/o medication issue:  1. Name of Medication: atorvastatin (LIPITOR) 10 MG tablet  2. How are you currently taking this medication (dosage and times per day)? Patient is not taking the medication.   3. Are you having a reaction (difficulty breathing--STAT)? no  4. What is your medication issue? Patient wants to speak with Raquel Norma Fredrickson about whether she should start the medication. She wasn't sure because of the side effects.

## 2019-12-04 NOTE — Telephone Encounter (Signed)
Patient refused Repatha d/t side effects. Not interested in trial with Praluent.  Severe muscle pain with atorvastatin 10mg   Rx for rosuvastatin 10mg  every Monday and Fridays.

## 2019-12-11 ENCOUNTER — Telehealth: Payer: Self-pay | Admitting: Cardiovascular Disease

## 2019-12-11 NOTE — Telephone Encounter (Signed)
Pt called to report that she had AVR 08/2018 and she forgot to take her premed antibiotics prior to her dental cleaning yesterday with Dr. Britta Mccreedy... they gave them to her at the office but it was not an hour prior.. I advised her that it was good that they gave them to her and to be very careful in the future not to forget... she asked what to watch for and I advised her to watch for mouth infection or fever but she should not worry and keep her appt for her Echo 12/2019.. pt agreed.   Will forward to Dr. Gwenlyn Found for review and any further recommendations.

## 2019-12-11 NOTE — Telephone Encounter (Signed)
Patient went to the dentist yesterday and forgot to take the antibiotic's beforehand. She does not know the name of the medication off the top of her head but states Dr. Kennon Holter nurse will know.

## 2019-12-12 DIAGNOSIS — L309 Dermatitis, unspecified: Secondary | ICD-10-CM | POA: Diagnosis not present

## 2019-12-12 DIAGNOSIS — Z23 Encounter for immunization: Secondary | ICD-10-CM | POA: Diagnosis not present

## 2019-12-12 DIAGNOSIS — D1722 Benign lipomatous neoplasm of skin and subcutaneous tissue of left arm: Secondary | ICD-10-CM | POA: Diagnosis not present

## 2019-12-12 DIAGNOSIS — L308 Other specified dermatitis: Secondary | ICD-10-CM | POA: Diagnosis not present

## 2019-12-19 ENCOUNTER — Other Ambulatory Visit: Payer: Self-pay | Admitting: Cardiovascular Disease

## 2019-12-31 ENCOUNTER — Telehealth: Payer: Self-pay | Admitting: Cardiovascular Disease

## 2019-12-31 NOTE — Telephone Encounter (Signed)
New Message:   Pt wants to know when does she need her next lab work for her Cholesterol?

## 2020-01-01 NOTE — Telephone Encounter (Signed)
Patient following up on when she needs to get her lab work.

## 2020-01-01 NOTE — Telephone Encounter (Signed)
Pt advised.

## 2020-01-01 NOTE — Telephone Encounter (Signed)
Pt last Lipid and Hepatic drawn 01/2019 would like to know when to have redrawn. Pt is on Crestor 10mg  twice a week. Seen in the Lipid Clinic. Will route to the Pharmacist to see if okay to order.

## 2020-01-01 NOTE — Telephone Encounter (Signed)
We changed her therapy this December , next Lipid panel due March/2021. We plan to call her when Lipid panel needed.

## 2020-01-02 DIAGNOSIS — L989 Disorder of the skin and subcutaneous tissue, unspecified: Secondary | ICD-10-CM | POA: Diagnosis not present

## 2020-01-14 ENCOUNTER — Other Ambulatory Visit: Payer: Self-pay | Admitting: Cardiovascular Disease

## 2020-01-14 ENCOUNTER — Other Ambulatory Visit (HOSPITAL_COMMUNITY): Payer: PPO

## 2020-01-15 ENCOUNTER — Ambulatory Visit: Payer: PPO | Attending: Internal Medicine

## 2020-01-15 DIAGNOSIS — Z23 Encounter for immunization: Secondary | ICD-10-CM | POA: Insufficient documentation

## 2020-01-15 NOTE — Progress Notes (Signed)
   Covid-19 Vaccination Clinic  Name:  [name] [name]    MRN: [id number] DOB: 1946/10/25  01/15/2020  [name] [name] was observed post Covid-19 immunization for 15 minutes without incidence. She was provided with Vaccine Information Sheet and instruction to access the V-Safe system.   [name] [name] was instructed to call 911 with any severe reactions post vaccine: Marland Kitchen Difficulty breathing  . Swelling of your face and throat  . A fast heartbeat  . A bad rash all over your body  . Dizziness and weakness    Immunizations Administered    Name Date Dose VIS Date Route   Pfizer COVID-19 Vaccine 01/15/2020 10:59 AM 0.3 mL 12/06/2019 Intramuscular   Manufacturer: [place]   Lot: [id number]   [place]: [id number]

## 2020-01-29 ENCOUNTER — Other Ambulatory Visit: Payer: Self-pay

## 2020-01-29 ENCOUNTER — Ambulatory Visit (HOSPITAL_COMMUNITY): Payer: PPO | Attending: Cardiology

## 2020-01-29 ENCOUNTER — Telehealth: Payer: Self-pay

## 2020-01-29 DIAGNOSIS — E7800 Pure hypercholesterolemia, unspecified: Secondary | ICD-10-CM | POA: Diagnosis not present

## 2020-01-29 DIAGNOSIS — I10 Essential (primary) hypertension: Secondary | ICD-10-CM

## 2020-01-29 DIAGNOSIS — Z952 Presence of prosthetic heart valve: Secondary | ICD-10-CM | POA: Insufficient documentation

## 2020-01-29 NOTE — Telephone Encounter (Signed)
-----   Message from Roland Earl sent at 01/29/2020  8:30 AM EST ----- Regarding: CT chst with contrast Would you order a BMET for this patient please?

## 2020-01-30 LAB — BASIC METABOLIC PANEL
BUN/Creatinine Ratio: 19 (ref 12–28)
BUN: 19 mg/dL (ref 8–27)
CO2: 25 mmol/L (ref 20–29)
Calcium: 10.3 mg/dL (ref 8.7–10.3)
Chloride: 101 mmol/L (ref 96–106)
Creatinine, Ser: 0.99 mg/dL (ref 0.57–1.00)
GFR calc Af Amer: 64 mL/min/{1.73_m2} (ref >59)
GFR calc non Af Amer: 56 mL/min/{1.73_m2} — ABNORMAL LOW (ref >59)
Glucose: 93 mg/dL (ref 65–99)
Potassium: 4.5 mmol/L (ref 3.5–5.2)
Sodium: 142 mmol/L (ref 134–144)

## 2020-01-31 ENCOUNTER — Other Ambulatory Visit: Payer: Self-pay

## 2020-01-31 DIAGNOSIS — I350 Nonrheumatic aortic (valve) stenosis: Secondary | ICD-10-CM

## 2020-01-31 DIAGNOSIS — Z953 Presence of xenogenic heart valve: Secondary | ICD-10-CM

## 2020-02-03 ENCOUNTER — Ambulatory Visit: Payer: PPO | Attending: Internal Medicine

## 2020-02-03 DIAGNOSIS — Z23 Encounter for immunization: Secondary | ICD-10-CM | POA: Insufficient documentation

## 2020-02-03 NOTE — Progress Notes (Signed)
   Covid-19 Vaccination Clinic  Name:  LAKRISHA BATDORF    MRN: [id number] DOB: 11/01/1946  02/03/2020  Ms. Mcgannon was observed post Covid-19 immunization for 15 minutes without incidence. She was provided with Vaccine Information Sheet and instruction to access the V-Safe system.   Ms. Gamboa was instructed to call 911 with any severe reactions post vaccine: Marland Kitchen Difficulty breathing  . Swelling of your face and throat  . A fast heartbeat  . A bad rash all over your body  . Dizziness and weakness    Immunizations Administered    Name Date Dose VIS Date Route   Pfizer COVID-19 Vaccine 02/03/2020  5:03 PM 0.3 mL 12/06/2019 Intramuscular   Manufacturer: [place]   Lot: [id number]   [place]: [id number]

## 2020-02-05 ENCOUNTER — Ambulatory Visit (HOSPITAL_BASED_OUTPATIENT_CLINIC_OR_DEPARTMENT_OTHER): Payer: PPO

## 2020-02-11 ENCOUNTER — Ambulatory Visit (HOSPITAL_BASED_OUTPATIENT_CLINIC_OR_DEPARTMENT_OTHER)
Admission: RE | Admit: 2020-02-11 | Discharge: 2020-02-11 | Disposition: A | Discharge status: Home / Self Care | Payer: PPO | Source: Ambulatory Visit | Attending: Cardiovascular Disease | Admitting: Cardiovascular Disease

## 2020-02-11 ENCOUNTER — Other Ambulatory Visit: Payer: Self-pay

## 2020-02-11 DIAGNOSIS — I712 Thoracic aortic aneurysm, without rupture, unspecified: Secondary | ICD-10-CM

## 2020-02-11 MED ORDER — IOHEXOL 350 MG/ML SOLN
100.0000 mL | Freq: Once | INTRAVENOUS | Status: AC | PRN
  Administered 2020-02-11: 100 mL via INTRAVENOUS

## 2020-02-12 ENCOUNTER — Telehealth: Payer: Self-pay | Admitting: Cardiovascular Disease

## 2020-02-12 NOTE — Telephone Encounter (Signed)
Lorretta Harp, MD  02/12/2020 7:09 AM EST    Stable small TAA 4.1 CM. Repeat 12 months   Patient called w/results

## 2020-02-12 NOTE — Telephone Encounter (Signed)
Patient calling for CT results. 

## 2020-02-13 ENCOUNTER — Ambulatory Visit: Payer: PPO | Admitting: Cardiovascular Disease

## 2020-02-14 ENCOUNTER — Ambulatory Visit: Payer: PPO | Admitting: Cardiology

## 2020-02-18 DIAGNOSIS — Z23 Encounter for immunization: Secondary | ICD-10-CM | POA: Diagnosis not present

## 2020-02-18 DIAGNOSIS — L409 Psoriasis, unspecified: Secondary | ICD-10-CM | POA: Diagnosis not present

## 2020-02-18 DIAGNOSIS — L821 Other seborrheic keratosis: Secondary | ICD-10-CM | POA: Diagnosis not present

## 2020-02-18 DIAGNOSIS — L820 Inflamed seborrheic keratosis: Secondary | ICD-10-CM | POA: Diagnosis not present

## 2020-02-26 ENCOUNTER — Other Ambulatory Visit (HOSPITAL_COMMUNITY): Payer: PPO

## 2020-03-04 ENCOUNTER — Other Ambulatory Visit: Payer: Self-pay

## 2020-03-04 ENCOUNTER — Ambulatory Visit: Payer: PPO | Admitting: Cardiovascular Disease

## 2020-03-04 ENCOUNTER — Encounter: Payer: Self-pay | Admitting: Cardiovascular Disease

## 2020-03-04 DIAGNOSIS — Z953 Presence of xenogenic heart valve: Secondary | ICD-10-CM | POA: Diagnosis not present

## 2020-03-04 DIAGNOSIS — I712 Thoracic aortic aneurysm, without rupture, unspecified: Secondary | ICD-10-CM

## 2020-03-04 DIAGNOSIS — R0602 Shortness of breath: Secondary | ICD-10-CM | POA: Diagnosis not present

## 2020-03-04 DIAGNOSIS — E7800 Pure hypercholesterolemia, unspecified: Secondary | ICD-10-CM | POA: Diagnosis not present

## 2020-03-04 DIAGNOSIS — Z951 Presence of aortocoronary bypass graft: Secondary | ICD-10-CM

## 2020-03-04 NOTE — Assessment & Plan Note (Signed)
History of essential hypertension with blood pressure measured today 138/84.  She is on amlodipine, hydrochlorothiazide, losartan and metoprolol.

## 2020-03-04 NOTE — Patient Instructions (Addendum)
Medication Instructions:  Take 100mg  Metoprolol 2 hours prior to CTA If you need a refill on your cardiac medications before your next appointment, please call your pharmacy.   Lab work: Fasting Lipids and Hepatic Function with Hemoglobin A1C and Vitamin D  BMET within 30 days prior to CTA  If you have labs (blood work) drawn today and your tests are completely normal, you will receive your results only by: [employer] (if you have MyChart) OR A paper copy in the mail If you have any lab test that is abnormal or we need to change your treatment, we will call you to review the results.  Testing/Procedures: Non-Cardiac CT scanning, (CAT scanning), is a noninvasive, special x-ray that produces cross-sectional images of the body using x-rays and a computer. CT scans help physicians diagnose and treat medical conditions. For some CT exams, a contrast material is used to enhance visibility in the area of the body being studied. CT scans provide greater clarity and reveal more details than regular x-ray exams.  AND  Your physician has requested that you have an echocardiogram. Echocardiography is a painless test that uses sound waves to create images of your heart. It provides your doctor with information about the size and shape of your heart and how well your heart's chambers and valves are working. This procedure takes approximately one hour. There are no restrictions for this procedure. [address]  Follow-Up: At [facility], you and your health needs are our priority.  As part of our continuing mission to provide you with exceptional heart care, we have created designated Provider Care Teams.  These Care Teams include your primary Cardiologist (physician) and Advanced Practice Providers (APPs -  Physician Assistants and Nurse Practitioners) who all work together to provide you with the care you need, when you need it. You may see Quay Burow, MD or one of the  following Advanced Practice Providers on your designated Care Team:    Kerin Ransom, PA-C  [place], [place]  Coletta Memos, [place]  Your physician wants you to follow-up in: 1 year after test. You will receive a reminder letter in the mail two months in advance. If you don't receive a letter, please call our office to schedule the follow-up appointment.  Any Other Special Instructions Will Be Listed Below (If Applicable). Within 30 days of the Coronary CTA - call office to get labwork and metoprolol.

## 2020-03-04 NOTE — Assessment & Plan Note (Signed)
History of coronary artery disease status post artery bypass grafting x2 with a LIMA to LAD and a vein to the PDA by Dr. Roxan Hockey 09/24/2018.

## 2020-03-04 NOTE — Assessment & Plan Note (Signed)
History of a small thoracic aorta aneurysm measuring 41 mm by recent CT angiogram performed 02/12/2020.  This will be repeated on annual basis.

## 2020-03-04 NOTE — Assessment & Plan Note (Signed)
History of hyperlipidemia on a PCSK9 up until November at which time she stopped it because of side effects and was placed on Crestor twice a week.  We will recheck a lipid liver profile in the near future

## 2020-03-04 NOTE — Assessment & Plan Note (Signed)
History of severe aortic stenosis status post aortic valve replacement by Dr. Roxan Hockey with a bioprosthetic valve 09/24/2018.  Her most recent 2D echo performed 01/29/2020 revealed normal LV systolic function with a well-functioning aortic bioprosthesis.  We will repeat a 2D echo in 1 year.

## 2020-03-04 NOTE — Progress Notes (Signed)
03/04/2020 [name] [name]   [birth year]  MR:[id number]  Primary Physician [name] [name], [name] Leyden, PA-C Primary Cardiologist: [name] Harp MD FACP, [place], [place], [place]  HPI:  [name] [name] is a 74 y.o.   mildly overweight married Caucasian female para 3, her mother to 88 grandchildrenwho I last saw in the office  09/11/2019.Is accompanied by her daughter [name] [name] today. Shewasself-referred to be established in our practice, a friend of hers is mypatient. She is retired from [employer] in [place] and moved to [place] 17 years ago here at her primary care physician is Dr. Clyda Hurdle. Her cardiovascular risk factor profile is notable for family history of heart disease with both parents died of myocardial infarctions in their 22s and 51s and a brother who died of an MI at 37. She has never had a heart attack or a clinical stroke. Risk factors otherwise remarkable for treated hypertension and hyperlipidemia. She does have GERD. She had negative nuclear stress test and 2-D echocardiogram at [facility]. She does have a history of a murmurand had a recent 2-D echo performed 01/30/18 revealing progression of her aortic stenosis now with a peak gradient of 70 and a valve area 1.17 cm. She does complain of some dyspnea on exertion. She denies chest pain. Because of symptoms of dyspnea as well as a 2D echo that revealed severe lAS.I performed outpatient cardiac cath on her 09/10/2018 revealing a total mid RCA with high-grade proximal LAD disease and severe aortic stenosis. She ultimately underwent CABG x2 with a LIMA to her LAD and a vein to the PDA as well as pericardial tissue aortic valve replacement by Dr. Roxan Hockey 09/24/2018 and was discharged a week later. She is recuperating nicely. Her symptoms preoperative dyspnea have resolved.  She had a 2D echo performed 01/21/2019 that revealed normal LV systolic function with a  well-functioning aortic bioprosthesis.  The thoracic aorta measured 4.7 cm on that study and therefore follow-up CT of her chest was done to confirm this which only showed the aortic dimension of 41 mm.  She still still complains of some shortness of breath but markedly improved since her aortic valve replacement.  She recently returned from a 9-day trip to [facility] which was therapeutic for her.  Since I saw her 8 months ago she continues to do well.  She gets occasional arthritic aches and pains and some shortness of breath.  A 2D echocardiogram performed 01/29/2020 revealed normal LV systolic function with a well-functioning aortic bioprosthesis, a CT angiogram performed 02/11/2020 revealed her thoracic aorta measured 41 mm and has been stable.  She did stop her PCSK9 for her hyperlipidemia because of side effects and now is on Crestor twice a week.  Current Meds  Medication Sig  . allopurinol (ZYLOPRIM) 100 MG tablet Take 1 tablet (100 mg total) by mouth daily.  Marland Kitchen amLODipine (NORVASC) 5 MG tablet Take 1 tablet (5 mg total) by mouth daily. (Patient taking differently: Take 5 mg by mouth daily. Patient takes 10 mg prn.)  . aspirin 81 MG EC tablet Take 1 tablet (81 mg total) by mouth daily.  . cephALEXin (KEFLEX) 500 MG capsule Take all 4 tablets one hour prior to dental procedure  . Cholecalciferol (VITAMIN D) 2000 units tablet Take 1 tablet by mouth daily.  Marland Kitchen dicyclomine (BENTYL) 10 MG capsule Take 10 mg by mouth 4 (four) times daily as needed for spasms.   . hydrochlorothiazide (MICROZIDE) 12.5 MG  capsule Take 1 capsule by mouth once daily  . lansoprazole (PREVACID) 30 MG capsule Take 30 mg by mouth daily as needed (heartburn).   Marland Kitchen levothyroxine (SYNTHROID, LEVOTHROID) 125 MCG tablet Take 100 mcg by mouth daily before breakfast.   . losartan (COZAAR) 50 MG tablet Take 1 tablet (50 mg total) by mouth daily.  . meclizine (ANTIVERT) 25 MG tablet Take 25 mg by mouth 3 (three) times daily as  needed for dizziness.   . metoprolol tartrate (LOPRESSOR) 25 MG tablet Take 1 tablet (25 mg total) by mouth 2 (two) times daily.  Marland Kitchen Propylene Glycol (SYSTANE BALANCE) 0.6 % SOLN Place 1 drop into both eyes 2 (two) times daily as needed (dry eyes).  . rosuvastatin (CRESTOR) 10 MG tablet Take 10mg  (1 tablet) every Monday and every Friday.  . traMADol (ULTRAM) 50 MG tablet Take 50 mg by mouth every 4-6 hours PRN severe pain  . trimethoprim (TRIMPEX) 100 MG tablet Take 100 mg by mouth daily as needed (For UTI Prevention).      Allergies  Allergen Reactions  . Repatha [Evolocumab]     Migraine, IBS flaire up, and hair loss  . Amoxicillin Rash    Has patient had a PCN reaction causing immediate rash, facial/tongue/throat swelling, SOB or lightheadedness with hypotension: No Has patient had a PCN reaction causing severe rash involving mucus membranes or skin necrosis: No Has patient had a PCN reaction that required hospitalization: No Has patient had a PCN reaction occurring within the last 10 years: No If all of the above answers are "NO", then may proceed with Cephalosporin use.    Marland Kitchen Amoxicillin-Pot Clavulanate Rash    Has patient had a PCN reaction causing immediate rash, facial/tongue/throat swelling, SOB or lightheadedness with hypotension: No Has patient had a PCN reaction causing severe rash involving mucus membranes or skin necrosis: No Has patient had a PCN reaction that required hospitalization: No Has patient had a PCN reaction occurring within the last 10 years: No If all of the above answers are "NO", then may proceed with Cephalosporin use.   . Ampicillin Rash  . Clarithromycin Rash  . Clopidogrel Bisulfate Rash  . Nitrofurantoin Itching  . Plavix [Clopidogrel Bisulfate] Rash  . Zestril [Lisinopril] Rash    Social History   Socioeconomic History  . Marital status: Widowed    Spouse name: Not on file  . Number of children: Not on file  . Years of education: Not on file    . Highest education level: Not on file  Occupational History  . Occupation: Retired  Tobacco Use  . Smoking status: Former Smoker    Packs/day: 0.80    Years: 20.00    Pack years: 16.00    Types: Cigarettes    Quit date: 09/21/1987    Years since quitting: 32.4  . Smokeless tobacco: Never Used  . Tobacco comment: socially  Substance and Sexual Activity  . Alcohol use: No  . Drug use: No  . Sexual activity: Not on file  Other Topics Concern  . Not on file  Social History Narrative  . Not on file   Social Determinants of Health   Financial Resource Strain:   . Difficulty of Paying Living Expenses: Not on file  Food Insecurity:   . Worried About Charity fundraiser in the Last Year: Not on file  . Ran Out of Food in the Last Year: Not on file  Transportation Needs:   . Lack of Transportation (Medical): Not on  file  . Lack of Transportation (Non-Medical): Not on file  Physical Activity:   . Days of Exercise per Week: Not on file  . Minutes of Exercise per Session: Not on file  Stress:   . Feeling of Stress : Not on file  Social Connections:   . Frequency of Communication with Friends and Family: Not on file  . Frequency of Social Gatherings with Friends and Family: Not on file  . Attends Religious Services: Not on file  . Active Member of Clubs or Organizations: Not on file  . Attends Archivist Meetings: Not on file  . Marital Status: Not on file  Intimate Partner Violence:   . Fear of Current or Ex-Partner: Not on file  . Emotionally Abused: Not on file  . Physically Abused: Not on file  . Sexually Abused: Not on file     Review of Systems: General: negative for chills, fever, night sweats or weight changes.  Cardiovascular: negative for chest pain, dyspnea on exertion, edema, orthopnea, palpitations, paroxysmal nocturnal dyspnea or shortness of breath Dermatological: negative for rash Respiratory: negative for cough or wheezing Urologic: negative for  hematuria Abdominal: negative for nausea, vomiting, diarrhea, bright red blood per rectum, melena, or hematemesis Neurologic: negative for visual changes, syncope, or dizziness All other systems reviewed and are otherwise negative except as noted above.    Blood pressure 138/84, pulse 75, temperature (!) 97.4 F (36.3 C), height 5' 5.5" (1.664 m), weight 223 lb (101.2 kg).  General appearance: alert and no distress Neck: no adenopathy, no carotid bruit, no JVD, supple, symmetrical, trachea midline and thyroid not enlarged, symmetric, no tenderness/mass/nodules Lungs: clear to auscultation bilaterally Heart: regular rate and rhythm, S1, S2 normal, no murmur, click, rub or gallop Extremities: extremities normal, atraumatic, no cyanosis or edema Pulses: 2+ and symmetric Skin: Skin color, texture, turgor normal. No rashes or lesions Neurologic: Alert and oriented X 3, normal strength and tone. Normal symmetric reflexes. Normal coordination and gait  EKG sinus rhythm at 75 with septal Q waves.  I personally reviewed this EKG.  ASSESSMENT AND PLAN:   HYPERCHOLESTEROLEMIA History of hyperlipidemia on a PCSK9 up until November at which time she stopped it because of side effects and was placed on Crestor twice a week.  We will recheck a lipid liver profile in the near future  Essential hypertension History of essential hypertension with blood pressure measured today 138/84.  She is on amlodipine, hydrochlorothiazide, losartan and metoprolol.  S/P CABG x 2 History of coronary artery disease status post artery bypass grafting x2 with a LIMA to LAD and a vein to the PDA by Dr. Roxan Hockey 09/24/2018.  S/P aortic valve replacement with bioprosthetic valve History of severe aortic stenosis status post aortic valve replacement by Dr. Roxan Hockey with a bioprosthetic valve 09/24/2018.  Her most recent 2D echo performed 01/29/2020 revealed normal LV systolic function with a well-functioning aortic  bioprosthesis.  We will repeat a 2D echo in 1 year.  Thoracic aortic aneurysm ([place]) History of a small thoracic aorta aneurysm measuring 41 mm by recent CT angiogram performed 02/12/2020.  This will be repeated on annual basis.      [name] Harp MD FACP,FACC,FAHA, [facility] 03/04/2020 11:53 AM

## 2020-03-06 DIAGNOSIS — L820 Inflamed seborrheic keratosis: Secondary | ICD-10-CM | POA: Diagnosis not present

## 2020-03-06 DIAGNOSIS — D2271 Melanocytic nevi of right lower limb, including hip: Secondary | ICD-10-CM | POA: Diagnosis not present

## 2020-03-06 DIAGNOSIS — Z85828 Personal history of other malignant neoplasm of skin: Secondary | ICD-10-CM | POA: Diagnosis not present

## 2020-03-06 DIAGNOSIS — L821 Other seborrheic keratosis: Secondary | ICD-10-CM | POA: Diagnosis not present

## 2020-03-06 DIAGNOSIS — D485 Neoplasm of uncertain behavior of skin: Secondary | ICD-10-CM | POA: Diagnosis not present

## 2020-03-06 DIAGNOSIS — C44612 Basal cell carcinoma of skin of right upper limb, including shoulder: Secondary | ICD-10-CM | POA: Diagnosis not present

## 2020-03-06 DIAGNOSIS — Z808 Family history of malignant neoplasm of other organs or systems: Secondary | ICD-10-CM | POA: Diagnosis not present

## 2020-03-16 ENCOUNTER — Encounter: Payer: Self-pay | Admitting: Gastroenterology

## 2020-03-16 DIAGNOSIS — Z6838 Body mass index (BMI) 38.0-38.9, adult: Secondary | ICD-10-CM | POA: Diagnosis not present

## 2020-03-16 DIAGNOSIS — R32 Unspecified urinary incontinence: Secondary | ICD-10-CM | POA: Diagnosis not present

## 2020-03-16 DIAGNOSIS — Z01419 Encounter for gynecological examination (general) (routine) without abnormal findings: Secondary | ICD-10-CM | POA: Diagnosis not present

## 2020-03-17 DIAGNOSIS — C44612 Basal cell carcinoma of skin of right upper limb, including shoulder: Secondary | ICD-10-CM | POA: Diagnosis not present

## 2020-03-19 DIAGNOSIS — R0602 Shortness of breath: Secondary | ICD-10-CM | POA: Diagnosis not present

## 2020-03-19 DIAGNOSIS — E7800 Pure hypercholesterolemia, unspecified: Secondary | ICD-10-CM | POA: Diagnosis not present

## 2020-03-20 ENCOUNTER — Telehealth: Payer: Self-pay | Admitting: Cardiovascular Disease

## 2020-03-20 LAB — HEPATIC FUNCTION PANEL
ALT: 23 IU/L (ref 0–32)
AST: 26 IU/L (ref 0–40)
Albumin: 4.4 g/dL (ref 3.7–4.7)
Alkaline Phosphatase: 106 IU/L (ref 39–117)
Bilirubin Total: 0.2 mg/dL (ref 0.0–1.2)
Bilirubin, Direct: 0.1 mg/dL (ref 0.00–0.40)
Total Protein: 6.7 g/dL (ref 6.0–8.5)

## 2020-03-20 LAB — HEMOGLOBIN A1C
Est. average glucose Bld gHb Est-mCnc: 120 mg/dL
Hgb A1c MFr Bld: 5.8 % — ABNORMAL HIGH (ref 4.8–5.6)

## 2020-03-20 LAB — LIPID PANEL
Chol/HDL Ratio: 3.9 ratio (ref 0.0–4.4)
Cholesterol, Total: 177 mg/dL (ref 100–199)
HDL: 45 mg/dL (ref >39)
LDL Chol Calc (NIH): 94 mg/dL (ref 0–99)
Triglycerides: 225 mg/dL — ABNORMAL HIGH (ref 0–149)
VLDL Cholesterol Cal: 38 mg/dL (ref 5–40)

## 2020-03-20 LAB — VITAMIN D 25 HYDROXY (VIT D DEFICIENCY, FRACTURES): Vit D, 25-Hydroxy: 31.1 ng/mL (ref 30.0–100.0)

## 2020-03-20 NOTE — Telephone Encounter (Signed)
Pt advised her lab results and notified her Dr. Gwenlyn Found has not had a chance to review... will call her back with any changes or recommendations. Pt agreed she has reviewed them already on My Chart.

## 2020-03-20 NOTE — Telephone Encounter (Signed)
  Pt is calling for her lab results   Please call

## 2020-03-24 ENCOUNTER — Telehealth: Payer: Self-pay

## 2020-03-24 DIAGNOSIS — R7309 Other abnormal glucose: Secondary | ICD-10-CM | POA: Diagnosis not present

## 2020-03-24 DIAGNOSIS — I712 Thoracic aortic aneurysm, without rupture, unspecified: Secondary | ICD-10-CM

## 2020-03-24 DIAGNOSIS — L409 Psoriasis, unspecified: Secondary | ICD-10-CM | POA: Diagnosis not present

## 2020-03-24 DIAGNOSIS — I350 Nonrheumatic aortic (valve) stenosis: Secondary | ICD-10-CM | POA: Diagnosis not present

## 2020-03-24 DIAGNOSIS — E782 Mixed hyperlipidemia: Secondary | ICD-10-CM | POA: Diagnosis not present

## 2020-03-24 DIAGNOSIS — E559 Vitamin D deficiency, unspecified: Secondary | ICD-10-CM | POA: Diagnosis not present

## 2020-03-24 DIAGNOSIS — I10 Essential (primary) hypertension: Secondary | ICD-10-CM | POA: Diagnosis not present

## 2020-03-24 DIAGNOSIS — R011 Cardiac murmur, unspecified: Secondary | ICD-10-CM | POA: Diagnosis not present

## 2020-03-24 DIAGNOSIS — Z953 Presence of xenogenic heart valve: Secondary | ICD-10-CM

## 2020-03-24 DIAGNOSIS — M159 Polyosteoarthritis, unspecified: Secondary | ICD-10-CM | POA: Diagnosis not present

## 2020-03-24 DIAGNOSIS — E039 Hypothyroidism, unspecified: Secondary | ICD-10-CM | POA: Diagnosis not present

## 2020-03-24 DIAGNOSIS — M109 Gout, unspecified: Secondary | ICD-10-CM | POA: Diagnosis not present

## 2020-03-24 NOTE — Telephone Encounter (Signed)
New message   Called patient to scheduled her Cardiac ct and she said that she just had one , I looked in chart and it shows Ct Angio test was done, she wants to know why we would schedule her 2 CT's seperately when they could have been done together?  Patient very upset wants to speak with nurse asap and someone in Management. Tests were ordered around the same time?

## 2020-03-24 NOTE — Telephone Encounter (Signed)
Spoke with patient. Per chart review on 03/04/2020 patient is supposed to have a repeat CT Angio in one year. No mention of Coronary CTA in note however CTA was ordered and attempted to be scheduled. Patient called upset stating that from her understanding the test wasn't supposed to be ordered for another year. Will route to MD for review.   Lab results reviewed with patient, patient agreeable to lipid clinic appointment with Dr. Debara Pickett. Will notify scheduling.

## 2020-03-29 NOTE — Telephone Encounter (Signed)
I don't see why she should have another CTA for another year to follow her TTA since she just had one 2/21

## 2020-03-30 NOTE — Telephone Encounter (Signed)
I am forwarding this to [employer] for any follow up and due to the patient's original request to speak with management.

## 2020-04-03 ENCOUNTER — Telehealth: Payer: Self-pay

## 2020-04-03 NOTE — Telephone Encounter (Signed)
[name] [name]:  Pt has dx of severe calcific aorta stenosis along with some other cardiac issues.  I believe she may have had aortic valve replacement surgery...  Her current echo was done 01/29/20. EF is within range..not sure about her stenosis considering her hx.    Please review and advise.  Thank you.

## 2020-04-06 ENCOUNTER — Other Ambulatory Visit: Payer: Self-pay | Admitting: Physician Assistant

## 2020-04-07 NOTE — Telephone Encounter (Signed)
[name] [name],  She had an AVR in 2019.  She is cleared for anesthetic care at [facility].   [name]

## 2020-04-07 NOTE — Telephone Encounter (Signed)
Pt cleared for [facility] by [facility].

## 2020-04-08 ENCOUNTER — Other Ambulatory Visit: Payer: Self-pay

## 2020-04-08 ENCOUNTER — Ambulatory Visit (AMBULATORY_SURGERY_CENTER): Payer: Self-pay

## 2020-04-08 VITALS — Temp 97.1°F | Ht 65.5 in | Wt 224.8 lb

## 2020-04-08 DIAGNOSIS — Z86010 Personal history of colonic polyps: Secondary | ICD-10-CM

## 2020-04-08 NOTE — Progress Notes (Signed)
No allergies to soy or egg Pt is not on blood thinners or diet pills Denies issues with sedation/intubation Denies atrial flutter/fib Denies constipation   Emmi instructions given to pt  Pt is aware of Covid safety and care partner requirements.  

## 2020-04-16 NOTE — Telephone Encounter (Signed)
In review of patient chart and talking with provider patient orders are being adjusted. Spoke with pt and she is aware of order update.  She gets her CT completed at [place] and would like to have her ECHO schedule there as well. I have updated orders to reflect where test should be scheduled.  No additional questions at this time.

## 2020-04-16 NOTE — Addendum Note (Signed)
Addended by: Newt Minion on: 04/16/2020 03:59 PM   Modules accepted: Orders

## 2020-04-17 ENCOUNTER — Telehealth: Payer: Self-pay | Admitting: Cardiovascular Disease

## 2020-04-17 DIAGNOSIS — M25571 Pain in right ankle and joints of right foot: Secondary | ICD-10-CM | POA: Diagnosis not present

## 2020-04-17 DIAGNOSIS — Z87891 Personal history of nicotine dependence: Secondary | ICD-10-CM | POA: Diagnosis not present

## 2020-04-17 DIAGNOSIS — M25561 Pain in right knee: Secondary | ICD-10-CM | POA: Diagnosis not present

## 2020-04-17 DIAGNOSIS — F329 Major depressive disorder, single episode, unspecified: Secondary | ICD-10-CM | POA: Diagnosis not present

## 2020-04-17 DIAGNOSIS — Z79899 Other long term (current) drug therapy: Secondary | ICD-10-CM

## 2020-04-17 DIAGNOSIS — R2241 Localized swelling, mass and lump, right lower limb: Secondary | ICD-10-CM | POA: Diagnosis not present

## 2020-04-17 DIAGNOSIS — Z96651 Presence of right artificial knee joint: Secondary | ICD-10-CM | POA: Diagnosis not present

## 2020-04-17 DIAGNOSIS — Z0000 Encounter for general adult medical examination without abnormal findings: Secondary | ICD-10-CM | POA: Diagnosis not present

## 2020-04-17 DIAGNOSIS — M2540 Effusion, unspecified joint: Secondary | ICD-10-CM | POA: Diagnosis not present

## 2020-04-17 DIAGNOSIS — M25461 Effusion, right knee: Secondary | ICD-10-CM | POA: Diagnosis not present

## 2020-04-17 DIAGNOSIS — M19071 Primary osteoarthritis, right ankle and foot: Secondary | ICD-10-CM | POA: Diagnosis not present

## 2020-04-17 DIAGNOSIS — W19XXXA Unspecified fall, initial encounter: Secondary | ICD-10-CM | POA: Diagnosis not present

## 2020-04-17 DIAGNOSIS — Z01812 Encounter for preprocedural laboratory examination: Secondary | ICD-10-CM

## 2020-04-17 NOTE — Telephone Encounter (Signed)
Message from Josephine with CT:  [name] [name], I called MRN: [id number] to get her in for CT and get labs and she stated she did not need this CT and wasn't due till next year.   She is gonna call you regarding that. Sorry!  Routed to [place] and [employer]

## 2020-04-17 NOTE — Telephone Encounter (Signed)
[name] [name] notified a BMET will be ordered She will contact patient to arrange CT test

## 2020-04-17 NOTE — Telephone Encounter (Signed)
[name] [name] is calling from [facility] in [facility] stating the patient needs Creatinine labs drawn before her CT to check her kidney function before giving her the dye for her CT with IV. Please advise.

## 2020-04-20 ENCOUNTER — Encounter: Payer: Self-pay | Admitting: Internal Medicine

## 2020-04-20 ENCOUNTER — Encounter: Payer: Self-pay | Admitting: Gastroenterology

## 2020-04-20 ENCOUNTER — Telehealth (INDEPENDENT_AMBULATORY_CARE_PROVIDER_SITE_OTHER): Payer: PPO | Admitting: Internal Medicine

## 2020-04-20 VITALS — BP 115/72 | Ht 65.5 in | Wt 217.9 lb

## 2020-04-20 DIAGNOSIS — Z952 Presence of prosthetic heart valve: Secondary | ICD-10-CM

## 2020-04-20 DIAGNOSIS — Z951 Presence of aortocoronary bypass graft: Secondary | ICD-10-CM | POA: Diagnosis not present

## 2020-04-20 DIAGNOSIS — T466X5A Adverse effect of antihyperlipidemic and antiarteriosclerotic drugs, initial encounter: Secondary | ICD-10-CM

## 2020-04-20 DIAGNOSIS — M791 Myalgia, unspecified site: Secondary | ICD-10-CM

## 2020-04-20 DIAGNOSIS — E785 Hyperlipidemia, unspecified: Secondary | ICD-10-CM | POA: Diagnosis not present

## 2020-04-20 MED ORDER — ROSUVASTATIN CALCIUM 5 MG PO TABS: ORAL_TABLET | ORAL | 3 refills | Status: DC

## 2020-04-20 MED ORDER — EZETIMIBE 10 MG PO TABS: 10.0000 mg | ORAL_TABLET | Freq: Every day | ORAL | 3 refills | Status: DC

## 2020-04-20 NOTE — Progress Notes (Signed)
Virtual Visit via Telephone Note   This visit type was conducted due to national recommendations for restrictions regarding the COVID-19 Pandemic (e.g. social distancing) in an effort to limit this patient's exposure and mitigate transmission in our community.  Due to her co-morbid illnesses, this patient is at least at moderate risk for complications without adequate follow up.  This format is felt to be most appropriate for this patient at this time.  The patient did not have access to video technology/had technical difficulties with video requiring transitioning to audio format only (telephone).  All issues noted in this document were discussed and addressed.  No physical exam could be performed with this format.  Please refer to the patient's chart for her  consent to telehealth for [facility]   Evaluation Performed: Telephone consult  Date:  04/20/2020   ID:  [name] [name], DOB 15-Sep-1946, MRN [id number]  Patient Location:  [place] [id number]  Provider location:   [address], [address] [place], [place] [address]  PCP:  Drosinis, Pamalee Leyden, PA-C  Cardiologist:  Quay Burow, MD Electrophysiologist:  None   Chief Complaint:  Manage dyslipdemia  History of Present Illness:    [name] [name] is a 74 y.o. female who presents via audio/video conferencing for a telehealth visit today.  This is a pleasant 74 year old female with longstanding dyslipidemia.  She has a history of high LDL cholesterol as well as high triglycerides, once over 800.  More recently she had progressive aortic valve disease and developed severe aortic stenosis and was found to have two-vessel coronary disease.  She underwent CABG x2, with LIMA to LAD and SVG to PDA and replacement of the aortic valve.  She has been relatively intolerant to statins but currently is on low-dose rosuvastatin 10 mg twice weekly.  She said even with this she noted some worsening of her muscle aches and  joint pain.  She also has some advanced arthritis.  After stopping the statin for about a week she noted some improvement.  Previously she had been on Repatha with marked improvement in her numbers and achieved an LDL of 42, however she did report side effects including migraine headaches, worsening of IBS symptoms and hair loss which she said improved significantly after stopping the injections 4 months.  She came off of the [place] back in December and recently had repeat labs in March 2021, that demonstrated total cholesterol 177, triglycerides 225, HDL 45 and LDL 94.  This represents therapy on rosuvastatin 10 mg twice weekly.  She reports diet has been stable if not become somewhat healthier as she has gotten older.  The patient does not have symptoms concerning for COVID-19 infection (fever, chills, cough, or new SHORTNESS OF BREATH).    Prior CV studies:   The following studies were reviewed today:  Chart reviewed, labs reviewed  PMHx:  Past Medical History:  Diagnosis Date  . Aortic stenosis, severe 08/2018   progressed from moderate by ECHO 01-03-17   . Arthritis   . Cancer ([place])    basal cell skin cancer  . Carotid artery bruit   . Cataract    bilateral repaired  . Claustrophobia   . Coronary artery disease   . Diverticulosis   . Family history of heart disease   . GERD (gastroesophageal reflux disease)   . Gout   . Heart murmur    repaired  . History of hiatal hernia   . History of kidney stones   .  Hyperlipemia   . Hypertension   . Irritable bowel syndrome   . Stroke [facility])    old- 25 yrs ago found on a test.  . Thyroid disease   . UTI (urinary tract infection) 03/09/14    Past Surgical History:  Procedure Laterality Date  . AORTIC VALVE REPLACEMENT N/A 09/24/2018   Procedure: AORTIC VALVE REPLACEMENT (AVR) using  an INSPIRIS RESILIA  Prosthetic Valve (Model #: 11500A, Size: 23MM, Serial #: [id number]);  Surgeon: Melrose Nakayama, MD;  Location: [place];  Service:  Open Heart Surgery;  Laterality: N/A;  . APPENDECTOMY     taken out with hysterectomy  . BLADDER SURGERY     Lift   . CARDIAC CATHETERIZATION    . COLONOSCOPY  2015  . CORONARY ARTERY BYPASS GRAFT N/A 09/24/2018   Procedure: CORONARY ARTERY BYPASS GRAFTING (CABG) x 2; (LIMA to LAD, SVG to PDA) using RIGHT THIGH GREATER SAPHENOUS VEIN GRAFT (SVG) and LEFT INTERNAL MAMMARY ARTERY (LIMA): LIMA to LAD, SVG to PDA ;  Surgeon: Melrose Nakayama, MD;  Location: [place];  Service: Open Heart Surgery;  Laterality: N/A;  . EYE SURGERY Bilateral    cataract surgery with lens implants  . lithotripsy   within last 10 years  . OOPHORECTOMY    . PARTIAL HYSTERECTOMY    . RIGHT/LEFT HEART CATH AND CORONARY ANGIOGRAPHY N/A 09/10/2018   Procedure: RIGHT/LEFT HEART CATH AND CORONARY ANGIOGRAPHY;  Surgeon: Lorretta Harp, MD;  Location: [place] [facility];  Service: Cardiovascular;  Laterality: N/A;  . TEE WITHOUT CARDIOVERSION N/A 09/24/2018   Procedure: TRANSESOPHAGEAL ECHOCARDIOGRAM (TEE);  Surgeon: Melrose Nakayama, MD;  Location: [place];  Service: Open Heart Surgery;  Laterality: N/A;  . TOTAL KNEE ARTHROPLASTY Right 07/24/2017   Procedure: RIGHT TOTAL KNEE ARTHROPLASTY;  Surgeon: Gaynelle Arabian, MD;  Location: [facility];  Service: Orthopedics;  Laterality: Right;  Adductor Block    FAMHx:  Family History  Problem Relation Age of Onset  . Heart disease Mother   . Heart disease Father   . Colon cancer Neg Hx   . Colon polyps Neg Hx   . Esophageal cancer Neg Hx   . Rectal cancer Neg Hx   . Stomach cancer Neg Hx     SOCHx:   reports that she quit smoking about 32 years ago. Her smoking use included cigarettes. She has a 16.00 pack-year smoking history. She has never used smokeless tobacco. She reports that she does not drink alcohol or use drugs.  ALLERGIES:  Allergies  Allergen Reactions  . Repatha [Evolocumab]     Migraine, IBS flaire up, and hair loss  . Amoxicillin Rash    Has patient  had a PCN reaction causing immediate rash, facial/tongue/throat swelling, SOB or lightheadedness with hypotension: No Has patient had a PCN reaction causing severe rash involving mucus membranes or skin necrosis: No Has patient had a PCN reaction that required hospitalization: No Has patient had a PCN reaction occurring within the last 10 years: No If all of the above answers are "NO", then may proceed with Cephalosporin use.    Marland Kitchen Amoxicillin-Pot Clavulanate Rash    Has patient had a PCN reaction causing immediate rash, facial/tongue/throat swelling, SOB or lightheadedness with hypotension: No Has patient had a PCN reaction causing severe rash involving mucus membranes or skin necrosis: No Has patient had a PCN reaction that required hospitalization: No Has patient had a PCN reaction occurring within the last 10 years: No If all of the  above answers are "NO", then may proceed with Cephalosporin use.   . Ampicillin Rash  . Clarithromycin Rash  . Clopidogrel Bisulfate Rash  . Nitrofurantoin Itching  . Plavix [Clopidogrel Bisulfate] Rash  . Zestril [Lisinopril] Rash    MEDS:  Current Meds  Medication Sig  . allopurinol (ZYLOPRIM) 100 MG tablet Take 1 tablet (100 mg total) by mouth daily.  Marland Kitchen aspirin 81 MG EC tablet Take 1 tablet (81 mg total) by mouth daily.  . cephALEXin (KEFLEX) 500 MG capsule Take all 4 tablets one hour prior to dental procedure  . Cholecalciferol (VITAMIN D) 2000 units tablet Take 1 tablet by mouth daily.  Marland Kitchen dicyclomine (BENTYL) 10 MG capsule Take 10 mg by mouth 4 (four) times daily as needed for spasms.   . hydrochlorothiazide (MICROZIDE) 12.5 MG capsule Take 1 capsule by mouth once daily  . lansoprazole (PREVACID) 30 MG capsule Take 30 mg by mouth daily as needed (heartburn).   Marland Kitchen levothyroxine (EUTHYROX) 100 MCG tablet Take 100 mcg by mouth daily before breakfast.  . losartan (COZAAR) 50 MG tablet Take 1 tablet (50 mg total) by mouth daily.  . meclizine  (ANTIVERT) 25 MG tablet Take 25 mg by mouth 3 (three) times daily as needed for dizziness.   . metoprolol tartrate (LOPRESSOR) 25 MG tablet Take 1 tablet by mouth twice daily  . Propylene Glycol (SYSTANE BALANCE) 0.6 % SOLN Place 1 drop into both eyes 2 (two) times daily as needed (dry eyes).  . rosuvastatin (CRESTOR) 10 MG tablet Take 10mg  (1 tablet) every Monday and every Friday.  . traMADol (ULTRAM) 50 MG tablet Take 50 mg by mouth every 4-6 hours PRN severe pain  . trimethoprim (TRIMPEX) 100 MG tablet Take 100 mg by mouth daily as needed (For UTI Prevention).      ROS: Pertinent items noted in HPI and remainder of comprehensive ROS otherwise negative.  Labs/Other Tests and Data Reviewed:    Recent Labs: 01/29/2020: BUN 19; Creatinine, Ser 0.99; Potassium 4.5; Sodium 142 03/19/2020: ALT 23   Recent Lipid Panel Lab Results  Component Value Date/Time   CHOL 177 03/19/2020 09:21 AM   TRIG 225 (H) 03/19/2020 09:21 AM   HDL 45 03/19/2020 09:21 AM   CHOLHDL 3.9 03/19/2020 09:21 AM   LDLCALC 94 03/19/2020 09:21 AM    Wt Readings from Last 3 Encounters:  04/20/20 217 lb 14.4 oz (98.8 kg)  04/08/20 224 lb 12.8 oz (102 kg)  03/04/20 223 lb (101.2 kg)     Exam:    Vital Signs:  BP 115/72   Ht 5' 5.5" (1.664 m)   Wt 217 lb 14.4 oz (98.8 kg)   SpO2 98%   BMI 35.71 kg/m    Exam not performed due to telephone visit  ASSESSMENT & PLAN:    1. Mixed dyslipidemia, goal LDL less than 70 2. Relative statin intolerance-myalgias 3. Repatha intolerant 4. Coronary artery disease status post CABG x2 5. Aortic stenosis status post AVR  Ms. Colchado has significant mixed dyslipidemia however has had some improvement on rosuvastatin 10 mg twice weekly.  She still has some side effects on this medication.  We reviewed medications she is tried in the past at one point she was on ezetimibe however does not recall having specific side effects with it or why it was discontinued.  I recommend starting  ezetimibe 10 mg daily which should give her a 20-25% reduction in cholesterol and I suspect will be well-tolerated.  In addition we will  decrease her rosuvastatin dose to 5mg , but I would advise delivering it 3 times weekly (M, W, F).  Plan repeat lipids in 3 months and follow-up with me afterwards.  Thanks again for the kind referral.  COVID-19 Education: The signs and symptoms of COVID-19 were discussed with the patient and how to seek care for testing (follow up with PCP or arrange E-visit).  The importance of social distancing was discussed today.  Patient Risk:   After full review of this patients clinical status, I feel that they are at least moderate risk at this time.  Time:   Today, I have spent 25 minutes with the patient with telehealth technology discussing dyslipidemia, statin intolerance, coronary artery disease.     Medication Adjustments/Labs and Tests Ordered: Current medicines are reviewed at length with the patient today.  Concerns regarding medicines are outlined above.   Tests Ordered: No orders of the defined types were placed in this encounter.   Medication Changes: No orders of the defined types were placed in this encounter.   Disposition:  in 3 month(s)  Pixie Casino, MD, [facility] [place] Director of the Advanced Lipid Disorders &  Cardiovascular Risk Reduction Clinic Diplomate of the American Board of Clinical Lipidology Attending Cardiologist  Direct Dial: [phone]  Fax: [phone]  Website:  [email or web address]  Pixie Casino, MD  04/20/2020 8:18 AM

## 2020-04-20 NOTE — Patient Instructions (Signed)
Medication Instructions:  DECREASE crestor to 5mg  on Monday, Wednesday, Friday START zetia 10mg  daily Continue all other current medications  *If you need a refill on your cardiac medications before your next appointment, please call your pharmacy*   Lab Work: Fasting lab work in 3 months to check cholesterol  Please completed about 1 week before your next visit with Dr. Debara Pickett   If you have labs (blood work) drawn today and your tests are completely normal, you will receive your results only by: Marland Kitchen MyChart Message (if you have MyChart) OR . A paper copy in the mail If you have any lab test that is abnormal or we need to change your treatment, we will call you to review the results.   Testing/Procedures: NONE   Follow-Up: At [facility], you and your health needs are our priority.  As part of our continuing mission to provide you with exceptional heart care, we have created designated Provider Care Teams.  These Care Teams include your primary Cardiologist (physician) and Advanced Practice Providers (APPs -  Physician Assistants and Nurse Practitioners) who all work together to provide you with the care you need, when you need it.  We recommend signing up for the patient portal called "MyChart".  Sign up information is provided on this After Visit Summary.  MyChart is used to connect with patients for Virtual Visits (Telemedicine).  Patients are able to view lab/test results, encounter notes, upcoming appointments, etc.  Non-urgent messages can be sent to your provider as well.   To learn more about what you can do with MyChart, go to [email or web address].    Your next appointment:   3 month(s) - lipid clinic  The format for your next appointment:   Either In Person or Virtual  Provider:   K. Mali Hilty, MD   Other Instructions

## 2020-04-22 ENCOUNTER — Encounter: Payer: Self-pay | Admitting: Gastroenterology

## 2020-04-22 ENCOUNTER — Ambulatory Visit (AMBULATORY_SURGERY_CENTER): Payer: PPO | Admitting: Gastroenterology

## 2020-04-22 ENCOUNTER — Other Ambulatory Visit: Payer: Self-pay

## 2020-04-22 VITALS — BP 141/75 | HR 61 | Temp 96.9°F | Resp 9 | Ht 65.5 in | Wt 224.8 lb

## 2020-04-22 DIAGNOSIS — D122 Benign neoplasm of ascending colon: Secondary | ICD-10-CM | POA: Diagnosis not present

## 2020-04-22 DIAGNOSIS — D120 Benign neoplasm of cecum: Secondary | ICD-10-CM | POA: Diagnosis not present

## 2020-04-22 DIAGNOSIS — I350 Nonrheumatic aortic (valve) stenosis: Secondary | ICD-10-CM | POA: Diagnosis not present

## 2020-04-22 DIAGNOSIS — I2510 Atherosclerotic heart disease of native coronary artery without angina pectoris: Secondary | ICD-10-CM | POA: Diagnosis not present

## 2020-04-22 DIAGNOSIS — Z86010 Personal history of colonic polyps: Secondary | ICD-10-CM | POA: Diagnosis not present

## 2020-04-22 MED ORDER — SODIUM CHLORIDE 0.9 % IV SOLN: 500.0000 mL | Freq: Once | INTRAVENOUS | Status: DC

## 2020-04-22 NOTE — Op Note (Signed)
[place] Patient Name: [name] [name] Procedure Date: 04/22/2020 10:00 AM MRN: [id number] Endoscopist: [place]. Loletha Carrow , MD Age: 74 Referring MD:  Date of Birth: 1946-11-15 Gender: Female Account #: [id number] Procedure:                Colonoscopy Indications:              Surveillance: Personal history of adenomatous                            polyps on last colonoscopy > 5 years ago                            (diminutive TA 02/2014) Medicines:                Monitored Anesthesia Care Procedure:                Pre-Anesthesia Assessment:                           - Prior to the procedure, a History and Physical                            was performed, and patient medications and                            allergies were reviewed. The patient's tolerance of                            previous anesthesia was also reviewed. The risks                            and benefits of the procedure and the sedation                            options and risks were discussed with the patient.                            All questions were answered, and informed consent                            was obtained. Prior Anticoagulants: The patient has                            taken no previous anticoagulant or antiplatelet                            agents except for aspirin. ASA Grade Assessment: II                            - A patient with mild systemic disease. After                            reviewing the risks and benefits, the patient was  deemed in satisfactory condition to undergo the                            procedure.                           After obtaining informed consent, the colonoscope                            was passed under direct vision. Throughout the                            procedure, the patient's blood pressure, pulse, and                            oxygen saturations were monitored continuously. The                             Colonoscope was introduced through the anus and                            advanced to the the cecum, identified by                            appendiceal orifice and ileocecal valve. The                            colonoscopy was somewhat difficult due to a                            redundant colon. The patient tolerated the                            procedure well. The quality of the bowel                            preparation was excellent with minimal lavage. The                            ileocecal valve, appendiceal orifice, and rectum                            were photographed. The bowel preparation used was                            Miralax. Scope In: 10:08:42 AM Scope Out: 10:37:43 AM Scope Withdrawal Time: 0 hours 26 minutes 22 seconds  Total Procedure Duration: 0 hours 29 minutes 1 second  Findings:                 The digital rectal exam findings include decreased                            sphincter tone.  A 12 mm polyp was found in the proximal ascending                            colon. The polyp was pedunculated and located                            between haustral folds. Improved position for                            polypectomy was achieved after repositioning the                            patient supine. The polyp was removed with a hot                            snare, taking part of the stalk as well. En bloc                            resection and retrieval were complete. The scope                            was then reintroduced and advanced to the cecum to                            inspect the polypectomy site and complete colon                            inspection.                           Multiple diverticula were found in the entire colon.                           The exam was otherwise without abnormality on                            direct and retroflexion views. Complications:            No immediate  complications. Estimated Blood Loss:     Estimated blood loss: none. Impression:               - Decreased sphincter tone found on digital rectal                            exam.                           - One 12 mm polyp in the proximal ascending colon,                            removed with a hot snare. Resected and retrieved.                           - Diverticulosis in the entire examined colon.                           -  The examination was otherwise normal on direct                            and retroflexion views. Recommendation:           - Patient has a contact number available for                            emergencies. The signs and symptoms of potential                            delayed complications were discussed with the                            patient. Return to normal activities tomorrow.                            Written discharge instructions were provided to the                            patient.                           - Resume previous diet.                           - Continue present medications.                           - Await pathology results.                           - Based on age and current guidelines, if polyp                            does not show high grade dysplasia, no repeat                            routine surveillance colonoscopy necessary. Montrice Gracey L. Loletha Carrow, MD 04/22/2020 10:52:00 AM This report has been signed electronically.

## 2020-04-22 NOTE — Progress Notes (Signed)
Called to room to assist during endoscopic procedure.  Patient ID and intended procedure confirmed with present staff. Received instructions for my participation in the procedure from the performing physician.  

## 2020-04-22 NOTE — Progress Notes (Signed)
Temp-JB VS-CW  Pt's states no medical or surgical changes since previsit or office visit.  

## 2020-04-22 NOTE — Patient Instructions (Signed)
Discharge instructions given. Handouts on polyps and diverticulosis. Resume previous medications. YOU HAD AN ENDOSCOPIC PROCEDURE TODAY AT THE  ENDOSCOPY CENTER:   Refer to the procedure report that was given to you for any specific questions about what was found during the examination.  If the procedure report does not answer your questions, please call your gastroenterologist to clarify.  If you requested that your care partner not be given the details of your procedure findings, then the procedure report has been included in a sealed envelope for you to review at your convenience later.  YOU SHOULD EXPECT: Some feelings of bloating in the abdomen. Passage of more gas than usual.  Walking can help get rid of the air that was put into your GI tract during the procedure and reduce the bloating. If you had a lower endoscopy (such as a colonoscopy or flexible sigmoidoscopy) you may notice spotting of blood in your stool or on the toilet paper. If you underwent a bowel prep for your procedure, you may not have a normal bowel movement for a few days.  Please Note:  You might notice some irritation and congestion in your nose or some drainage.  This is from the oxygen used during your procedure.  There is no need for concern and it should clear up in a day or so.  SYMPTOMS TO REPORT IMMEDIATELY:   Following lower endoscopy (colonoscopy or flexible sigmoidoscopy):  Excessive amounts of blood in the stool  Significant tenderness or worsening of abdominal pains  Swelling of the abdomen that is new, acute  Fever of 100F or higher   For urgent or emergent issues, a gastroenterologist can be reached at any hour by calling [phone]. Do not use MyChart messaging for urgent concerns.    DIET:  We do recommend a small meal at first, but then you may proceed to your regular diet.  Drink plenty of fluids but you should avoid alcoholic beverages for 24 hours.  ACTIVITY:  You should plan to take  it easy for the rest of today and you should NOT DRIVE or use heavy machinery until tomorrow (because of the sedation medicines used during the test).    FOLLOW UP: Our staff will call the number listed on your records 48-72 hours following your procedure to check on you and address any questions or concerns that you may have regarding the information given to you following your procedure. If we do not reach you, we will leave a message.  We will attempt to reach you two times.  During this call, we will ask if you have developed any symptoms of COVID 19. If you develop any symptoms (ie: fever, flu-like symptoms, shortness of breath, cough etc.) before then, please call [phone].  If you test positive for Covid 19 in the 2 weeks post procedure, please call and report this information to us.    If any biopsies were taken you will be contacted by phone or by letter within the next 1-3 weeks.  Please call us at [phone] if you have not heard about the biopsies in 3 weeks.    SIGNATURES/CONFIDENTIALITY: You and/or your care partner have signed paperwork which will be entered into your electronic medical record.  These signatures attest to the fact that that the information above on your After Visit Summary has been reviewed and is understood.  Full responsibility of the confidentiality of this discharge information lies with you and/or your care-partner. 

## 2020-04-22 NOTE — Telephone Encounter (Signed)
Spoke to patient she stated she already had chest ct and echo 01/2020.Stated both need to be repeated in 1 year.Advised I will let our scheduling pool know to schedule next year.

## 2020-04-22 NOTE — Progress Notes (Signed)
Report given to PACU, vss 

## 2020-04-24 ENCOUNTER — Telehealth: Payer: Self-pay

## 2020-04-24 ENCOUNTER — Ambulatory Visit (HOSPITAL_BASED_OUTPATIENT_CLINIC_OR_DEPARTMENT_OTHER): Payer: PPO

## 2020-04-24 NOTE — Telephone Encounter (Signed)
  Follow up Call-  Call back number 04/22/2020  Post procedure Call Back phone  # [phone]  Permission to leave phone message Yes  Some recent data might be hidden     Patient questions:  Do you have a fever, pain , or abdominal swelling? No. Pain Score  0 *  Have you tolerated food without any problems? Yes.    Have you been able to return to your normal activities? Yes.    Do you have any questions about your discharge instructions: Diet   No. Medications  No. Follow up visit  No.  Do you have questions or concerns about your Care? No.  Actions: * If pain score is 4 or above: No action needed, pain <4.  1. Have you developed a fever since your procedure? no  2.   Have you had an respiratory symptoms (SOB or cough) since your procedure? no  3.   Have you tested positive for COVID 19 since your procedure no  4.   Have you had any family members/close contacts diagnosed with the COVID 19 since your procedure?  no   If yes to any of these questions please route to Joylene John, RN and Erenest Rasher, RN

## 2020-04-27 ENCOUNTER — Encounter: Payer: Self-pay | Admitting: Gastroenterology

## 2020-05-01 DIAGNOSIS — M25561 Pain in right knee: Secondary | ICD-10-CM | POA: Diagnosis not present

## 2020-05-01 DIAGNOSIS — M2540 Effusion, unspecified joint: Secondary | ICD-10-CM | POA: Diagnosis not present

## 2020-05-15 DIAGNOSIS — L309 Dermatitis, unspecified: Secondary | ICD-10-CM | POA: Diagnosis not present

## 2020-06-11 DIAGNOSIS — M25552 Pain in left hip: Secondary | ICD-10-CM | POA: Diagnosis not present

## 2020-06-11 DIAGNOSIS — M1712 Unilateral primary osteoarthritis, left knee: Secondary | ICD-10-CM | POA: Diagnosis not present

## 2020-06-11 DIAGNOSIS — M7062 Trochanteric bursitis, left hip: Secondary | ICD-10-CM | POA: Diagnosis not present

## 2020-06-11 DIAGNOSIS — Z96651 Presence of right artificial knee joint: Secondary | ICD-10-CM | POA: Diagnosis not present

## 2020-06-11 DIAGNOSIS — Z471 Aftercare following joint replacement surgery: Secondary | ICD-10-CM | POA: Diagnosis not present

## 2020-06-18 DIAGNOSIS — C44319 Basal cell carcinoma of skin of other parts of face: Secondary | ICD-10-CM | POA: Diagnosis not present

## 2020-06-18 DIAGNOSIS — D485 Neoplasm of uncertain behavior of skin: Secondary | ICD-10-CM | POA: Diagnosis not present

## 2020-06-18 DIAGNOSIS — L578 Other skin changes due to chronic exposure to nonionizing radiation: Secondary | ICD-10-CM | POA: Diagnosis not present

## 2020-06-18 DIAGNOSIS — L820 Inflamed seborrheic keratosis: Secondary | ICD-10-CM | POA: Diagnosis not present

## 2020-07-07 ENCOUNTER — Ambulatory Visit: Payer: PPO | Admitting: Internal Medicine

## 2020-07-07 DIAGNOSIS — C44319 Basal cell carcinoma of skin of other parts of face: Secondary | ICD-10-CM | POA: Diagnosis not present

## 2020-07-14 ENCOUNTER — Telehealth: Payer: Self-pay | Admitting: Internal Medicine

## 2020-07-14 NOTE — Telephone Encounter (Signed)
Okay to add this for when patient comes in for lab work to check lipids? Thanks!

## 2020-07-14 NOTE — Telephone Encounter (Signed)
   Pt would like to add A1c test to her lab work

## 2020-07-15 ENCOUNTER — Other Ambulatory Visit: Payer: Self-pay

## 2020-07-15 DIAGNOSIS — R82998 Other abnormal findings in urine: Secondary | ICD-10-CM | POA: Diagnosis not present

## 2020-07-15 DIAGNOSIS — Z79899 Other long term (current) drug therapy: Secondary | ICD-10-CM

## 2020-07-15 DIAGNOSIS — I10 Essential (primary) hypertension: Secondary | ICD-10-CM | POA: Diagnosis not present

## 2020-07-15 DIAGNOSIS — M1A09X0 Idiopathic chronic gout, multiple sites, without tophus (tophi): Secondary | ICD-10-CM | POA: Diagnosis not present

## 2020-07-15 DIAGNOSIS — E785 Hyperlipidemia, unspecified: Secondary | ICD-10-CM

## 2020-07-15 DIAGNOSIS — E559 Vitamin D deficiency, unspecified: Secondary | ICD-10-CM | POA: Diagnosis not present

## 2020-07-15 DIAGNOSIS — R7301 Impaired fasting glucose: Secondary | ICD-10-CM | POA: Diagnosis not present

## 2020-07-15 NOTE — Telephone Encounter (Signed)
Ok to add A1c to labs.  Dr Lemmie Evens

## 2020-07-15 NOTE — Telephone Encounter (Signed)
Ordered blood work-  Called patient, LVM advising that we had added this to her blood work she should be having soon, left call back number if questions.

## 2020-08-07 DIAGNOSIS — E785 Hyperlipidemia, unspecified: Secondary | ICD-10-CM | POA: Diagnosis not present

## 2020-08-07 LAB — LIPID PANEL
Chol/HDL Ratio: 4.1 ratio (ref 0.0–4.4)
Cholesterol, Total: 165 mg/dL (ref 100–199)
HDL: 40 mg/dL (ref >39)
LDL Chol Calc (NIH): 87 mg/dL (ref 0–99)
Triglycerides: 223 mg/dL — ABNORMAL HIGH (ref 0–149)
VLDL Cholesterol Cal: 38 mg/dL (ref 5–40)

## 2020-08-13 ENCOUNTER — Other Ambulatory Visit: Payer: Self-pay

## 2020-08-13 ENCOUNTER — Ambulatory Visit: Payer: PPO | Admitting: Internal Medicine

## 2020-08-13 ENCOUNTER — Encounter: Payer: Self-pay | Admitting: Internal Medicine

## 2020-08-13 VITALS — BP 148/76 | HR 81 | Ht 65.5 in | Wt 228.0 lb

## 2020-08-13 DIAGNOSIS — Z951 Presence of aortocoronary bypass graft: Secondary | ICD-10-CM | POA: Diagnosis not present

## 2020-08-13 DIAGNOSIS — T466X5A Adverse effect of antihyperlipidemic and antiarteriosclerotic drugs, initial encounter: Secondary | ICD-10-CM | POA: Diagnosis not present

## 2020-08-13 DIAGNOSIS — E785 Hyperlipidemia, unspecified: Secondary | ICD-10-CM | POA: Diagnosis not present

## 2020-08-13 DIAGNOSIS — M791 Myalgia, unspecified site: Secondary | ICD-10-CM

## 2020-08-13 MED ORDER — VASCEPA 0.5 G PO CAPS: 2.0000 g | ORAL_CAPSULE | Freq: Two times a day (BID) | ORAL | 11 refills | Status: DC

## 2020-08-13 NOTE — Patient Instructions (Signed)
Medication Instructions:  START vascepa 0.5mg  capsules - take 4 capsules twice daily  *If you need a refill on your cardiac medications before your next appointment, please call your pharmacy*   Lab Work: FASTING lipid panel in 3-4 months to check cholesterol   If you have labs (blood work) drawn today and your tests are completely normal, you will receive your results only by: Marland Kitchen MyChart Message (if you have MyChart) OR . A paper copy in the mail If you have any lab test that is abnormal or we need to change your treatment, we will call you to review the results.   Testing/Procedures: NONE   Follow-Up: At [facility], you and your health needs are our priority.  As part of our continuing mission to provide you with exceptional heart care, we have created designated Provider Care Teams.  These Care Teams include your primary Cardiologist (physician) and Advanced Practice Providers (APPs -  Physician Assistants and Nurse Practitioners) who all work together to provide you with the care you need, when you need it.  We recommend signing up for the patient portal called "MyChart".  Sign up information is provided on this After Visit Summary.  MyChart is used to connect with patients for Virtual Visits (Telemedicine).  Patients are able to view lab/test results, encounter notes, upcoming appointments, etc.  Non-urgent messages can be sent to your provider as well.   To learn more about what you can do with MyChart, go to [email or web address].    Your next appointment:   3-4 month(s) - lipid clinic  The format for your next appointment:   In Person or Virtual  Provider:   K. Mali Hilty, MD   Other Instructions

## 2020-08-13 NOTE — Progress Notes (Signed)
LIPID CLINIC CONSULT NOTE  Chief Complaint:  Follow-up dyslipidemia  Primary Care Physician: Drosinis, Pamalee Leyden, PA-C  Primary Cardiologist:  Quay Burow, MD  HPI:  [name] [name] is a 74 y.o. female who is being seen today for the evaluation of dyslipidemia at the request of Quay Burow, MD. [name] [name] is a 74 y.o. female who presents via audio/video conferencing for a telehealth visit today.  This is a pleasant 74 year old female with longstanding dyslipidemia.  She has a history of high LDL cholesterol as well as high triglycerides, once over 800.  More recently she had progressive aortic valve disease and developed severe aortic stenosis and was found to have two-vessel coronary disease.  She underwent CABG x2, with LIMA to LAD and SVG to PDA and replacement of the aortic valve.  She has been relatively intolerant to statins but currently is on low-dose rosuvastatin 10 mg twice weekly.  She said even with this she noted some worsening of her muscle aches and joint pain.  She also has some advanced arthritis.  After stopping the statin for about a week she noted some improvement.  Previously she had been on Repatha with marked improvement in her numbers and achieved an LDL of 42, however she did report side effects including migraine headaches, worsening of IBS symptoms and hair loss which she said improved significantly after stopping the injections 4 months.  She came off of the Micro back in December and recently had repeat labs in March 2021, that demonstrated total cholesterol 177, triglycerides 225, HDL 45 and LDL 94.  This represents therapy on rosuvastatin 10 mg twice weekly.  She reports diet has been stable if not become somewhat healthier as she has gotten older.  08/13/2020  [name] [name] returns today for office follow-up.  I last seen her virtually.  Plan was to adjust her statin regimen due to side effects and I recommended taking rosuvastatin 5 mg every Monday  Wednesday and Friday and adding ezetimibe 10 mg daily to her regimen.  This has resulted in overall reduction in her lipids.  Total cholesterol now 165, triglycerides 223, HDL 40 and LDL 87.  Her hemoglobin A1c is 5.9.  She still remains above target LDL and her triglycerides are higher than ideal.  We discussed other possible options including adding Vascepa.  She seemed interested but was concerned about issues as particular swallowing pills given her history of IBS and other side effects.   PMHx:  Past Medical History:  Diagnosis Date  . Aortic stenosis, severe 08/2018   progressed from moderate by ECHO 01-03-17   . Arthritis   . Cancer ([place])    basal cell skin cancer  . Carotid artery bruit   . Cataract    bilateral repaired  . Claustrophobia   . Coronary artery disease   . Diverticulosis   . Family history of heart disease   . GERD (gastroesophageal reflux disease)   . Gout   . Heart murmur    repaired  . History of hiatal hernia   . History of kidney stones   . Hyperlipemia   . Hypertension   . Irritable bowel syndrome   . Stroke [facility])    old- 25 yrs ago found on a test.  . Thyroid disease   . UTI (urinary tract infection) 03/09/14    Past Surgical History:  Procedure Laterality Date  . AORTIC VALVE REPLACEMENT N/A 09/24/2018   Procedure: AORTIC VALVE REPLACEMENT (AVR) using  an INSPIRIS  RESILIA  Prosthetic Valve (Model #: 11500A, Size: 23MM, Serial #: [id number]);  Surgeon: Melrose Nakayama, MD;  Location: [place];  Service: Open Heart Surgery;  Laterality: N/A;  . APPENDECTOMY     taken out with hysterectomy  . BLADDER SURGERY     Lift   . CARDIAC CATHETERIZATION    . COLONOSCOPY  2015  . CORONARY ARTERY BYPASS GRAFT N/A 09/24/2018   Procedure: CORONARY ARTERY BYPASS GRAFTING (CABG) x 2; (LIMA to LAD, SVG to PDA) using RIGHT THIGH GREATER SAPHENOUS VEIN GRAFT (SVG) and LEFT INTERNAL MAMMARY ARTERY (LIMA): LIMA to LAD, SVG to PDA ;  Surgeon: Melrose Nakayama, MD;   Location: [place];  Service: Open Heart Surgery;  Laterality: N/A;  . EYE SURGERY Bilateral    cataract surgery with lens implants  . lithotripsy   within last 10 years  . OOPHORECTOMY    . PARTIAL HYSTERECTOMY    . RIGHT/LEFT HEART CATH AND CORONARY ANGIOGRAPHY N/A 09/10/2018   Procedure: RIGHT/LEFT HEART CATH AND CORONARY ANGIOGRAPHY;  Surgeon: Lorretta Harp, MD;  Location: [place] [facility];  Service: Cardiovascular;  Laterality: N/A;  . TEE WITHOUT CARDIOVERSION N/A 09/24/2018   Procedure: TRANSESOPHAGEAL ECHOCARDIOGRAM (TEE);  Surgeon: Melrose Nakayama, MD;  Location: [place];  Service: Open Heart Surgery;  Laterality: N/A;  . TOTAL KNEE ARTHROPLASTY Right 07/24/2017   Procedure: RIGHT TOTAL KNEE ARTHROPLASTY;  Surgeon: Gaynelle Arabian, MD;  Location: [facility];  Service: Orthopedics;  Laterality: Right;  Adductor Block    FAMHx:  Family History  Problem Relation Age of Onset  . Heart disease Mother   . Heart disease Father   . Colon cancer Neg Hx   . Colon polyps Neg Hx   . Esophageal cancer Neg Hx   . Rectal cancer Neg Hx   . Stomach cancer Neg Hx     SOCHx:   reports that she quit smoking about 32 years ago. Her smoking use included cigarettes. She has a 16.00 pack-year smoking history. She has never used smokeless tobacco. She reports that she does not drink alcohol and does not use drugs.  ALLERGIES:  Allergies  Allergen Reactions  . Repatha [Evolocumab]     Migraine, IBS flaire up, and hair loss  . Amoxicillin Rash    Has patient had a PCN reaction causing immediate rash, facial/tongue/throat swelling, SOB or lightheadedness with hypotension: No Has patient had a PCN reaction causing severe rash involving mucus membranes or skin necrosis: No Has patient had a PCN reaction that required hospitalization: No Has patient had a PCN reaction occurring within the last 10 years: No If all of the above answers are "NO", then may proceed with Cephalosporin use.    Marland Kitchen  Amoxicillin-Pot Clavulanate Rash    Has patient had a PCN reaction causing immediate rash, facial/tongue/throat swelling, SOB or lightheadedness with hypotension: No Has patient had a PCN reaction causing severe rash involving mucus membranes or skin necrosis: No Has patient had a PCN reaction that required hospitalization: No Has patient had a PCN reaction occurring within the last 10 years: No If all of the above answers are "NO", then may proceed with Cephalosporin use.   . Ampicillin Rash  . Clarithromycin Rash  . Clopidogrel Bisulfate Rash  . Nitrofurantoin Itching  . Plavix [Clopidogrel Bisulfate] Rash  . Zestril [Lisinopril] Rash    ROS: Pertinent items noted in HPI and remainder of comprehensive ROS otherwise negative.  HOME MEDS: Current Outpatient Medications on File Prior to  Visit  Medication Sig Dispense Refill  . allopurinol (ZYLOPRIM) 100 MG tablet Take 1 tablet (100 mg total) by mouth daily. 30 tablet 0  . aspirin 81 MG EC tablet Take 1 tablet (81 mg total) by mouth daily.    . cephALEXin (KEFLEX) 500 MG capsule Take all 4 tablets one hour prior to dental procedure 4 capsule 6  . Cholecalciferol (VITAMIN D) 2000 units tablet Take 1 tablet by mouth daily.    Marland Kitchen dicyclomine (BENTYL) 10 MG capsule Take 10 mg by mouth 4 (four) times daily as needed for spasms.     . hydrochlorothiazide (MICROZIDE) 12.5 MG capsule Take 1 capsule by mouth once daily 90 capsule 0  . lansoprazole (PREVACID) 30 MG capsule Take 30 mg by mouth daily as needed (heartburn).     Marland Kitchen levothyroxine (EUTHYROX) 100 MCG tablet Take 100 mcg by mouth daily before breakfast.    . losartan (COZAAR) 50 MG tablet Take 1 tablet (50 mg total) by mouth daily. 90 tablet 0  . meclizine (ANTIVERT) 25 MG tablet Take 25 mg by mouth 3 (three) times daily as needed for dizziness.     . metoprolol tartrate (LOPRESSOR) 25 MG tablet Take 1 tablet by mouth twice daily 180 tablet 3  . Propylene Glycol (SYSTANE BALANCE) 0.6 %  SOLN Place 1 drop into both eyes 2 (two) times daily as needed (dry eyes).    . rosuvastatin (CRESTOR) 5 MG tablet Take 1 tablet by mouth on Monday, Wednesday, Friday. 30 tablet 3  . tolterodine (DETROL LA) 4 MG 24 hr capsule Take 4 mg by mouth daily.    . traMADol (ULTRAM) 50 MG tablet Take 50 mg by mouth every 4-6 hours PRN severe pain 30 tablet 0  . trimethoprim (TRIMPEX) 100 MG tablet Take 100 mg by mouth daily as needed (For UTI Prevention).     Marland Kitchen amLODipine (NORVASC) 5 MG tablet Take 1 tablet (5 mg total) by mouth daily. (Patient taking differently: Take 5 mg by mouth daily. Patient takes 10 mg prn.) 90 tablet 3  . ezetimibe (ZETIA) 10 MG tablet Take 1 tablet (10 mg total) by mouth daily. 90 tablet 3   No current facility-administered medications on file prior to visit.    LABS/IMAGING: No results found for this or any previous visit (from the past 48 hour(s)). No results found.  LIPID PANEL:    Component Value Date/Time   CHOL 165 08/07/2020 0845   TRIG 223 (H) 08/07/2020 0845   HDL 40 08/07/2020 0845   CHOLHDL 4.1 08/07/2020 0845   LDLCALC 87 08/07/2020 0845    WEIGHTS: Wt Readings from Last 3 Encounters:  08/13/20 228 lb (103.4 kg)  04/22/20 224 lb 12.8 oz (102 kg)  04/20/20 217 lb 14.4 oz (98.8 kg)    VITALS: BP (!) 148/76   Pulse 81   Ht 5' 5.5" (1.664 m)   Wt 228 lb (103.4 kg)   BMI 37.36 kg/m   EXAM: Deferred  EKG: Deferred  ASSESSMENT: 1. Mixed dyslipidemia, goal LDL less than 70 2. Relative statin intolerance-myalgias 3. Repatha intolerant 4. Coronary artery disease status post CABG x2 5. Aortic stenosis status post AVR  PLAN: 1.   So far [name] [name] is tolerating the change in her statin and the addition of ezetimibe without any new GI side effects or any significant myalgias.  She is willing to try therapy for elevated triglycerides and will provide samples for Vascepa.  If she can swallow the medication and does  not seem to have side effects then  we can trial prior authorization and repeat lipids in 3 to 4 months.  I would advise she use the 1 mg capsule versus the 2 mg capsule.  This should be easier for her to swallow.  Pixie Casino, MD, [facility], [place] Director of the Advanced Lipid Disorders &  Cardiovascular Risk Reduction Clinic Diplomate of the American Board of Clinical Lipidology Attending Cardiologist  Direct Dial: [phone]  Fax: [phone]  Website:  [email or web address] Mariza Bourget 08/13/2020, 12:45 PM

## 2020-09-08 DIAGNOSIS — L578 Other skin changes due to chronic exposure to nonionizing radiation: Secondary | ICD-10-CM | POA: Diagnosis not present

## 2020-09-08 DIAGNOSIS — D2271 Melanocytic nevi of right lower limb, including hip: Secondary | ICD-10-CM | POA: Diagnosis not present

## 2020-09-08 DIAGNOSIS — L821 Other seborrheic keratosis: Secondary | ICD-10-CM | POA: Diagnosis not present

## 2020-09-08 DIAGNOSIS — Z808 Family history of malignant neoplasm of other organs or systems: Secondary | ICD-10-CM | POA: Diagnosis not present

## 2020-09-08 DIAGNOSIS — D225 Melanocytic nevi of trunk: Secondary | ICD-10-CM | POA: Diagnosis not present

## 2020-09-08 DIAGNOSIS — L820 Inflamed seborrheic keratosis: Secondary | ICD-10-CM | POA: Diagnosis not present

## 2020-09-08 DIAGNOSIS — L570 Actinic keratosis: Secondary | ICD-10-CM | POA: Diagnosis not present

## 2020-09-08 DIAGNOSIS — D2261 Melanocytic nevi of right upper limb, including shoulder: Secondary | ICD-10-CM | POA: Diagnosis not present

## 2020-09-08 DIAGNOSIS — D485 Neoplasm of uncertain behavior of skin: Secondary | ICD-10-CM | POA: Diagnosis not present

## 2020-09-08 DIAGNOSIS — Z85828 Personal history of other malignant neoplasm of skin: Secondary | ICD-10-CM | POA: Diagnosis not present

## 2020-09-15 DIAGNOSIS — Z23 Encounter for immunization: Secondary | ICD-10-CM | POA: Diagnosis not present

## 2020-09-16 ENCOUNTER — Telehealth: Payer: Self-pay | Admitting: Internal Medicine

## 2020-09-16 NOTE — Telephone Encounter (Signed)
Patient has questions about Icosapent Ethyl (VASCEPA) 0.5 g CAPS, she would like nurse.

## 2020-09-16 NOTE — Telephone Encounter (Signed)
Spoke with pt, directions regarding vascepa discussed. She does report that she does get muscle pain from the crestor and has to not take it for a couple days and restart it. Okay given for her to continue to do that if needed but to make sure she takes it prior to having lab work drawn and to make [place] aware if she is not able to tolerate. Pt agreed with this plan.

## 2020-09-23 ENCOUNTER — Other Ambulatory Visit: Payer: Self-pay | Admitting: Cardiovascular Disease

## 2020-09-30 DIAGNOSIS — R011 Cardiac murmur, unspecified: Secondary | ICD-10-CM | POA: Diagnosis not present

## 2020-09-30 DIAGNOSIS — I10 Essential (primary) hypertension: Secondary | ICD-10-CM | POA: Diagnosis not present

## 2020-09-30 DIAGNOSIS — E7800 Pure hypercholesterolemia, unspecified: Secondary | ICD-10-CM | POA: Diagnosis not present

## 2020-09-30 DIAGNOSIS — M159 Polyosteoarthritis, unspecified: Secondary | ICD-10-CM | POA: Diagnosis not present

## 2020-09-30 DIAGNOSIS — N390 Urinary tract infection, site not specified: Secondary | ICD-10-CM | POA: Diagnosis not present

## 2020-09-30 DIAGNOSIS — R7309 Other abnormal glucose: Secondary | ICD-10-CM | POA: Diagnosis not present

## 2020-09-30 DIAGNOSIS — E782 Mixed hyperlipidemia: Secondary | ICD-10-CM | POA: Diagnosis not present

## 2020-09-30 DIAGNOSIS — E559 Vitamin D deficiency, unspecified: Secondary | ICD-10-CM | POA: Diagnosis not present

## 2020-09-30 DIAGNOSIS — E039 Hypothyroidism, unspecified: Secondary | ICD-10-CM | POA: Diagnosis not present

## 2020-09-30 DIAGNOSIS — I350 Nonrheumatic aortic (valve) stenosis: Secondary | ICD-10-CM | POA: Diagnosis not present

## 2020-10-01 ENCOUNTER — Ambulatory Visit: Payer: PPO

## 2020-10-05 DIAGNOSIS — Z20822 Contact with and (suspected) exposure to covid-19: Secondary | ICD-10-CM | POA: Diagnosis not present

## 2020-10-22 ENCOUNTER — Telehealth: Payer: Self-pay | Admitting: Cardiovascular Disease

## 2020-10-22 NOTE — Telephone Encounter (Signed)
Pt c/o medication issue:  1. Name of Medication:  Icosapent Ethyl (VASCEPA) 0.5 g CAPS  2. How are you currently taking this medication (dosage and times per day)?  Taking two times per day   3. Are you having a reaction (difficulty breathing--STAT)?  No reaction  4. What is your medication issue?  Medication is too expensive and she wanting to know if there is something cheaper that would work for her.   Please call/advise.

## 2020-10-23 DIAGNOSIS — N390 Urinary tract infection, site not specified: Secondary | ICD-10-CM | POA: Diagnosis not present

## 2020-10-23 NOTE — Telephone Encounter (Signed)
Not really- she is on max tolerated statin, zetia, could not take Repatha - bempedoic acid is also costly, lovaza and OTC fish oil have no data to support cardiovascular risk reduction.  Dr Lemmie Evens

## 2020-10-23 NOTE — Telephone Encounter (Signed)
Vascepa is too costly Could she change to any other different medication?

## 2020-10-26 NOTE — Telephone Encounter (Signed)
Patient states she just started Vascepa and is going to only take one 0.5mg  capsule twice daily instead of as prescribed She has lipid clinic follow up on 11/27/20

## 2020-10-30 ENCOUNTER — Telehealth: Payer: Self-pay | Admitting: Cardiovascular Disease

## 2020-10-30 NOTE — Telephone Encounter (Signed)
Pt states she called last week because she needed an rx filled by Dr. Gwenlyn Found before she has dental work done. The pt ended up getting the medication but Dr. Gwenlyn Found was not the one who authorized it. She has not heard anything from our office since her call last week.  The patient "didn't want to go into detail with the receptionist" and just wanted to speak to Dr. Kennon Holter Nurse

## 2020-10-30 NOTE — Telephone Encounter (Signed)
Spoke with patient. Patient wanted to verify antibiotic for dental procedure. Antibiotic verified because she received the medication from her dermatologist. Patient to take 4-500mg  tablets of Keflex one hour prior to dental procedure. No further questions at this time. Understanding verbalized.

## 2020-11-05 DIAGNOSIS — M7062 Trochanteric bursitis, left hip: Secondary | ICD-10-CM | POA: Diagnosis not present

## 2020-11-26 ENCOUNTER — Telehealth: Payer: Self-pay | Admitting: Internal Medicine

## 2020-11-26 NOTE — Telephone Encounter (Signed)
Spoke with pt states that she is having muscle pain and she will stop the rosuvastatin and continue the Zetia. She states that she has to stop the Vascepa because she states that she has IBS and it gives her gas so bad she cannot "go out in public" and wakes her up in the middle of the night with gas and bad bowel pain. So she will stop rosuvastatin and vascepa and continue the zetia. Any new medication you want to add or just wait until Virtual visit? Please advise?

## 2020-11-26 NOTE — Telephone Encounter (Signed)
    Pt is requesting to speak with Dr. Lysbeth Penner nurse. She said she wanted to discuss her medication and her appt for tomorrow. She asked if she can get a callback before 12 pm today

## 2020-11-26 NOTE — Telephone Encounter (Signed)
Will discuss further at her visit, thanks.  Dr. Lemmie Evens

## 2020-11-27 ENCOUNTER — Ambulatory Visit: Payer: PPO | Admitting: Internal Medicine

## 2020-11-27 NOTE — Telephone Encounter (Signed)
NOTED

## 2020-12-02 ENCOUNTER — Telehealth: Payer: Self-pay | Admitting: Physician Assistant

## 2020-12-02 NOTE — Telephone Encounter (Signed)
Pt daughter called stating her mother had dental work this morning, did not eat lunch and has had poor PO intake tonight. Pressure is 90s/60s. She is nauseous. They want guidance on taking 25 mg lopressor tonight. HR is 100. They have given her high salt foods and are giving her water to drink. I encouraged her to eat dinner, if able. I asked her to give her 12.5 mg lopressor now and check HR and BP in 2 hrs. If BP above 95 systolic, can give the other 12.5 mg lorpessor.

## 2020-12-17 ENCOUNTER — Telehealth: Payer: Self-pay | Admitting: *Deleted

## 2020-12-17 ENCOUNTER — Telehealth (INDEPENDENT_AMBULATORY_CARE_PROVIDER_SITE_OTHER): Payer: PPO | Admitting: Internal Medicine

## 2020-12-17 ENCOUNTER — Encounter: Payer: Self-pay | Admitting: Internal Medicine

## 2020-12-17 VITALS — Ht 65.0 in | Wt 221.0 lb

## 2020-12-17 DIAGNOSIS — Z952 Presence of prosthetic heart valve: Secondary | ICD-10-CM | POA: Diagnosis not present

## 2020-12-17 DIAGNOSIS — E785 Hyperlipidemia, unspecified: Secondary | ICD-10-CM

## 2020-12-17 DIAGNOSIS — T466X5D Adverse effect of antihyperlipidemic and antiarteriosclerotic drugs, subsequent encounter: Secondary | ICD-10-CM

## 2020-12-17 DIAGNOSIS — M791 Myalgia, unspecified site: Secondary | ICD-10-CM | POA: Diagnosis not present

## 2020-12-17 DIAGNOSIS — Z951 Presence of aortocoronary bypass graft: Secondary | ICD-10-CM

## 2020-12-17 NOTE — Telephone Encounter (Signed)
  Patient Consent for Virtual Visit         [name] [name] has provided verbal consent on 12/17/2020 for a virtual visit (video or telephone).   CONSENT FOR VIRTUAL VISIT FOR:  [name] [name]  By participating in this virtual visit I agree to the following:  I hereby voluntarily request, consent and authorize [facility] and its employed or contracted physicians, physician assistants, nurse practitioners or other licensed health care professionals (the Practitioner), to provide me with telemedicine health care services (the "Services") as deemed necessary by the treating Practitioner. I acknowledge and consent to receive the Services by the Practitioner via telemedicine. I understand that the telemedicine visit will involve communicating with the Practitioner through live audiovisual communication technology and the disclosure of certain medical information by electronic transmission. I acknowledge that I have been given the opportunity to request an in-person assessment or other available alternative prior to the telemedicine visit and am voluntarily participating in the telemedicine visit.  I understand that I have the right to withhold or withdraw my consent to the use of telemedicine in the course of my care at any time, without affecting my right to future care or treatment, and that the Practitioner or I may terminate the telemedicine visit at any time. I understand that I have the right to inspect all information obtained and/or recorded in the course of the telemedicine visit and may receive copies of available information for a reasonable fee.  I understand that some of the potential risks of receiving the Services via telemedicine include:  Marland Kitchen Delay or interruption in medical evaluation due to technological equipment failure or disruption; . Information transmitted may not be sufficient (e.g. poor resolution of images) to allow for appropriate medical decision making by the  Practitioner; and/or  . In rare instances, security protocols could fail, causing a breach of personal health information.  Furthermore, I acknowledge that it is my responsibility to provide information about my medical history, conditions and care that is complete and accurate to the best of my ability. I acknowledge that Practitioner's advice, recommendations, and/or decision may be based on factors not within their control, such as incomplete or inaccurate data provided by me or distortions of diagnostic images or specimens that may result from electronic transmissions. I understand that the practice of medicine is not an exact science and that Practitioner makes no warranties or guarantees regarding treatment outcomes. I acknowledge that a copy of this consent can be made available to me via my patient portal ([facility]), or I can request a printed copy by calling the office of [facility].    I understand that my insurance will be billed for this visit.   I have read or had this consent read to me. . I understand the contents of this consent, which adequately explains the benefits and risks of the Services being provided via telemedicine.  . I have been provided ample opportunity to ask questions regarding this consent and the Services and have had my questions answered to my satisfaction. . I give my informed consent for the services to be provided through the use of telemedicine in my medical care

## 2020-12-17 NOTE — Patient Instructions (Addendum)
Medication Instructions:   medication removed from your medication list  *If you need a refill on your cardiac medications before your next appointment, please call your pharmacy*   Lab Work: Fasting lipids in 2 month  ( feb 2022)   labs (blood work) drawn today and your tests are completely normal, you will receive your results only by: Marland Kitchen MyChart Message (if you have MyChart) OR . A paper copy in the mail If you have any lab test that is abnormal or we need to change your treatment, we will call you to review the results.   Testing/Procedures: Not needed   Follow-Up: At [facility], you and your health needs are our priority.  As part of our continuing mission to provide you with exceptional heart care, we have created designated Provider Care Teams.  These Care Teams include your primary Cardiologist (physician) and Advanced Practice Providers (APPs -  Physician Assistants and Nurse Practitioners) who all work together to provide you with the care you need, when you need it.     Your next appointment:   2 month(s)  The format for your next appointment:   In Person  Provider:   K. Mali Hilty, MD

## 2020-12-17 NOTE — Telephone Encounter (Signed)
RN spoke to patient. Instruction were given  from today's virtual visit 12/17/20 .  AVS SUMMARY has been sent by mychart and mailed -labslip and appointment for 02/19/20.   Patient verbalized understanding

## 2020-12-17 NOTE — Progress Notes (Signed)
Virtual Visit via Telephone Note   This visit type was conducted due to national recommendations for restrictions regarding the COVID-19 Pandemic (e.g. social distancing) in an effort to limit this patient's exposure and mitigate transmission in our community.  Due to her co-morbid illnesses, this patient is at least at moderate risk for complications without adequate follow up.  This format is felt to be most appropriate for this patient at this time.  The patient did not have access to video technology/had technical difficulties with video requiring transitioning to audio format only (telephone).  All issues noted in this document were discussed and addressed.  No physical exam could be performed with this format.  Please refer to the patient's chart for her  consent to telehealth for [facility].   Evaluation Performed: Telephone consult  Date:  12/17/2020   ID:  [name] [name], DOB 1946/03/18, MRN [id number]  Patient Location:  [place] [id number]  Provider location:   [address], [address] [place], [place] [address]  PCP:  Drosinis, Pamalee Leyden, PA-C  Cardiologist:  Quay Burow, MD Electrophysiologist:  None   Chief Complaint:  Manage dyslipdemia  History of Present Illness:    [name] [name] is a 74 y.o. female who presents via audio/video conferencing for a telehealth visit today.  This is a pleasant 74 year old female with longstanding dyslipidemia.  She has a history of high LDL cholesterol as well as high triglycerides, once over 800.  More recently she had progressive aortic valve disease and developed severe aortic stenosis and was found to have two-vessel coronary disease.  She underwent CABG x2, with LIMA to LAD and SVG to PDA and replacement of the aortic valve.  She has been relatively intolerant to statins but currently is on low-dose rosuvastatin 10 mg twice weekly.  She said even with this she noted some worsening of her muscle aches and  joint pain.  She also has some advanced arthritis.  After stopping the statin for about a week she noted some improvement.  Previously she had been on Repatha with marked improvement in her numbers and achieved an LDL of 42, however she did report side effects including migraine headaches, worsening of IBS symptoms and hair loss which she said improved significantly after stopping the injections 4 months.  She came off of the [place] back in December and recently had repeat labs in March 2021, that demonstrated total cholesterol 177, triglycerides 225, HDL 45 and LDL 94.  This represents therapy on rosuvastatin 10 mg twice weekly.  She reports diet has been stable if not become somewhat healthier as she has gotten older.  12/17/2020  [name] [name] is seen today via telephone follow-up.  At her last office visit I had recommended starting Vascepa for elevated triglycerides and we provided some samples.  Unfortunately she was not able to tolerate the medication.  I had given her the 1 g capsule and she was taking 4 twice a day and then decreased it to 2 twice a day but had worsening of her underlying IBS symptoms with increased gas and loose stool.  This has improved somewhat after stopping it.  In addition, she stopped her rosuvastatin which she was taking 5 mg 3 times a week due to recurrent myalgias.  She remains on ezetimibe 10 mg daily.  The patient does not have symptoms concerning for COVID-19 infection (fever, chills, cough, or new SHORTNESS OF BREATH).    Prior CV studies:   The  following studies were reviewed today:  Chart reviewed, labs reviewed  PMHx:  Past Medical History:  Diagnosis Date   Aortic stenosis, severe 08/2018   progressed from moderate by ECHO 01-03-17    Arthritis    Cancer ([place])    basal cell skin cancer   Carotid artery bruit    Cataract    bilateral repaired   Claustrophobia    Coronary artery disease    Diverticulosis    Family history of heart disease     GERD (gastroesophageal reflux disease)    Gout    Heart murmur    repaired   History of hiatal hernia    History of kidney stones    Hyperlipemia    Hypertension    Irritable bowel syndrome    Stroke [facility])    old- 25 yrs ago found on a test.   Thyroid disease    UTI (urinary tract infection) 03/09/14    Past Surgical History:  Procedure Laterality Date   AORTIC VALVE REPLACEMENT N/A 09/24/2018   Procedure: AORTIC VALVE REPLACEMENT (AVR) using  an INSPIRIS RESILIA  Prosthetic Valve (Model #: 11500A, Size: 23MM, Serial #: [id number]);  Surgeon: Melrose Nakayama, MD;  Location: [place];  Service: Open Heart Surgery;  Laterality: N/A;   APPENDECTOMY     taken out with hysterectomy   BLADDER SURGERY     Lift    CARDIAC CATHETERIZATION     COLONOSCOPY  2015   CORONARY ARTERY BYPASS GRAFT N/A 09/24/2018   Procedure: CORONARY ARTERY BYPASS GRAFTING (CABG) x 2; (LIMA to LAD, SVG to PDA) using RIGHT THIGH GREATER SAPHENOUS VEIN GRAFT (SVG) and LEFT INTERNAL MAMMARY ARTERY (LIMA): LIMA to LAD, SVG to PDA ;  Surgeon: Melrose Nakayama, MD;  Location: [place];  Service: Open Heart Surgery;  Laterality: N/A;   EYE SURGERY Bilateral    cataract surgery with lens implants   lithotripsy   within last 10 years   OOPHORECTOMY     PARTIAL HYSTERECTOMY     RIGHT/LEFT HEART CATH AND CORONARY ANGIOGRAPHY N/A 09/10/2018   Procedure: RIGHT/LEFT HEART CATH AND CORONARY ANGIOGRAPHY;  Surgeon: Lorretta Harp, MD;  Location: [place] CV [facility];  Service: Cardiovascular;  Laterality: N/A;   TEE WITHOUT CARDIOVERSION N/A 09/24/2018   Procedure: TRANSESOPHAGEAL ECHOCARDIOGRAM (TEE);  Surgeon: Melrose Nakayama, MD;  Location: [place];  Service: Open Heart Surgery;  Laterality: N/A;   TOTAL KNEE ARTHROPLASTY Right 07/24/2017   Procedure: RIGHT TOTAL KNEE ARTHROPLASTY;  Surgeon: Gaynelle Arabian, MD;  Location: [facility];  Service: Orthopedics;  Laterality: Right;  Adductor Block    FAMHx:   Family History  Problem Relation Age of Onset   Heart disease Mother    Heart disease Father    Colon cancer Neg Hx    Colon polyps Neg Hx    Esophageal cancer Neg Hx    Rectal cancer Neg Hx    Stomach cancer Neg Hx     SOCHx:   reports that she quit smoking about 33 years ago. Her smoking use included cigarettes. She has a 16.00 pack-year smoking history. She has never used smokeless tobacco. She reports that she does not drink alcohol and does not use drugs.  ALLERGIES:  Allergies  Allergen Reactions   Repatha [Evolocumab]     Migraine, IBS flaire up, and hair loss   Amoxicillin Rash    Has patient had a PCN reaction causing immediate rash, facial/tongue/throat swelling, SOB or lightheadedness with hypotension: No Has  patient had a PCN reaction causing severe rash involving mucus membranes or skin necrosis: No Has patient had a PCN reaction that required hospitalization: No Has patient had a PCN reaction occurring within the last 10 years: No If all of the above answers are "NO", then may proceed with Cephalosporin use.     Amoxicillin-Pot Clavulanate Rash    Has patient had a PCN reaction causing immediate rash, facial/tongue/throat swelling, SOB or lightheadedness with hypotension: No Has patient had a PCN reaction causing severe rash involving mucus membranes or skin necrosis: No Has patient had a PCN reaction that required hospitalization: No Has patient had a PCN reaction occurring within the last 10 years: No If all of the above answers are "NO", then may proceed with Cephalosporin use.    Ampicillin Rash   Clarithromycin Rash   Clopidogrel Bisulfate Rash   Nitrofurantoin Itching   Plavix [Clopidogrel Bisulfate] Rash   Zestril [Lisinopril] Rash    MEDS:  Current Meds  Medication Sig   allopurinol (ZYLOPRIM) 100 MG tablet Take 1 tablet (100 mg total) by mouth daily.   amLODipine (NORVASC) 5 MG tablet Take 1 tablet (5 mg total) by mouth daily.  (Patient taking differently: Take 5 mg by mouth daily. Patient takes 10 mg prn.)   aspirin 81 MG EC tablet Take 1 tablet (81 mg total) by mouth daily.   cephALEXin (KEFLEX) 500 MG capsule Take all 4 tablets one hour prior to dental procedure   Cholecalciferol (VITAMIN D) 2000 units tablet Take 1 tablet by mouth daily.   dicyclomine (BENTYL) 10 MG capsule Take 10 mg by mouth 4 (four) times daily as needed for spasms.    ezetimibe (ZETIA) 10 MG tablet Take 1 tablet (10 mg total) by mouth daily.   hydrochlorothiazide (MICROZIDE) 12.5 MG capsule Take 1 capsule by mouth once daily   levothyroxine (SYNTHROID) 100 MCG tablet Take 100 mcg by mouth daily before breakfast.   losartan (COZAAR) 50 MG tablet Take 1 tablet (50 mg total) by mouth daily.   meclizine (ANTIVERT) 25 MG tablet Take 25 mg by mouth 3 (three) times daily as needed for dizziness.    metoprolol tartrate (LOPRESSOR) 25 MG tablet Take 1 tablet by mouth twice daily   Propylene Glycol 0.6 % SOLN Place 1 drop into both eyes 2 (two) times daily as needed (dry eyes).   tolterodine (DETROL LA) 4 MG 24 hr capsule Take 4 mg by mouth daily.   traMADol (ULTRAM) 50 MG tablet Take 50 mg by mouth every 4-6 hours PRN severe pain   trimethoprim (TRIMPEX) 100 MG tablet Take 100 mg by mouth daily as needed (For UTI Prevention).      ROS: Pertinent items noted in HPI and remainder of comprehensive ROS otherwise negative.  Labs/Other Tests and Data Reviewed:    Recent Labs: 01/29/2020: BUN 19; Creatinine, Ser 0.99; Potassium 4.5; Sodium 142 03/19/2020: ALT 23   Recent Lipid Panel Lab Results  Component Value Date/Time   CHOL 165 08/07/2020 08:45 AM   TRIG 223 (H) 08/07/2020 08:45 AM   HDL 40 08/07/2020 08:45 AM   CHOLHDL 4.1 08/07/2020 08:45 AM   LDLCALC 87 08/07/2020 08:45 AM    Wt Readings from Last 3 Encounters:  12/17/20 221 lb (100.2 kg)  08/13/20 228 lb (103.4 kg)  04/22/20 224 lb 12.8 oz (102 kg)     Exam:    Vital  Signs:  Ht 5\' 5"  (1.651 m)    Wt 221 lb (100.2 kg)  BMI 36.78 kg/m    Exam not performed due to telephone visit  ASSESSMENT & PLAN:    1. Mixed dyslipidemia, goal LDL less than 70 2. Relative statin intolerance-myalgias 3. Repatha intolerant 4. Coronary artery disease status post CABG x2 5. Aortic stenosis status post AVR  [name] [name] had to stop both Vascepa and rosuvastatin due to side effects.  She is currently only on ezetimibe.  I would like to repeat lipids in 2 to 3 months.  At that time she might be a candidate for inclisiran which was just FDA approved yesterday.  She was not able to tolerate Repatha however it seems that inclisiran is much better tolerated. She is agreeable to this plan.  COVID-19 Education: The signs and symptoms of COVID-19 were discussed with the patient and how to seek care for testing (follow up with PCP or arrange E-visit).  The importance of social distancing was discussed today.  Patient Risk:   After full review of this patients clinical status, I feel that they are at least moderate risk at this time.  Time:   Today, I have spent 15 minutes with the patient with telehealth technology discussing dyslipidemia, statin intolerance, coronary artery disease.     Medication Adjustments/Labs and Tests Ordered: Current medicines are reviewed at length with the patient today.  Concerns regarding medicines are outlined above.   Tests Ordered: No orders of the defined types were placed in this encounter.   Medication Changes: No orders of the defined types were placed in this encounter.   Disposition:  in 3 month(s)  Pixie Casino, MD, [facility], [place] Director of the Advanced Lipid Disorders &  Cardiovascular Risk Reduction Clinic Diplomate of the American Board of Clinical Lipidology Attending Cardiologist  Direct Dial: [phone]   Fax: [phone]  Website:  [email or web address]  Pixie Casino, MD   12/17/2020 9:10 AM

## 2020-12-21 DIAGNOSIS — M25531 Pain in right wrist: Secondary | ICD-10-CM | POA: Diagnosis not present

## 2020-12-21 DIAGNOSIS — M79644 Pain in right finger(s): Secondary | ICD-10-CM | POA: Diagnosis not present

## 2020-12-21 DIAGNOSIS — R300 Dysuria: Secondary | ICD-10-CM | POA: Diagnosis not present

## 2020-12-23 ENCOUNTER — Other Ambulatory Visit: Payer: Self-pay | Admitting: Internal Medicine

## 2021-01-21 DIAGNOSIS — Z1231 Encounter for screening mammogram for malignant neoplasm of breast: Secondary | ICD-10-CM | POA: Diagnosis not present

## 2021-01-28 ENCOUNTER — Telehealth: Payer: Self-pay | Admitting: Cardiovascular Disease

## 2021-01-28 DIAGNOSIS — Z79899 Other long term (current) drug therapy: Secondary | ICD-10-CM

## 2021-01-28 DIAGNOSIS — I712 Thoracic aortic aneurysm, without rupture, unspecified: Secondary | ICD-10-CM

## 2021-01-28 DIAGNOSIS — I10 Essential (primary) hypertension: Secondary | ICD-10-CM

## 2021-01-28 NOTE — Telephone Encounter (Signed)
Spoke with pt, she is aware of the echo that is scheduled for march 2. CTA scheduled for 02/17/21 @ 1:30 pm. She is aware she needs a bmp and that paperwork will be mailed to the patient. Patient will contact her medical doctor about the valium she will need for the CT scan.

## 2021-01-28 NOTE — Telephone Encounter (Signed)
Patient would like to speak with a nurse about some testing she wants to have done. She states she wants to have an echo and a CT.

## 2021-01-28 NOTE — Telephone Encounter (Signed)
Absolutely.

## 2021-01-28 NOTE — Telephone Encounter (Signed)
Patient call and made aware. Lab orders placed

## 2021-01-28 NOTE — Telephone Encounter (Signed)
Patient wanted to know if Dr. Gwenlyn Found would put in orders to check her Vitamin D levels when she has her BMET done. Please advise

## 2021-02-04 ENCOUNTER — Other Ambulatory Visit (HOSPITAL_COMMUNITY): Payer: PPO

## 2021-02-11 DIAGNOSIS — M7062 Trochanteric bursitis, left hip: Secondary | ICD-10-CM | POA: Diagnosis not present

## 2021-02-11 DIAGNOSIS — M1712 Unilateral primary osteoarthritis, left knee: Secondary | ICD-10-CM | POA: Diagnosis not present

## 2021-02-11 DIAGNOSIS — Z96651 Presence of right artificial knee joint: Secondary | ICD-10-CM | POA: Diagnosis not present

## 2021-02-15 ENCOUNTER — Other Ambulatory Visit: Payer: Self-pay

## 2021-02-15 ENCOUNTER — Telehealth: Payer: Self-pay

## 2021-02-15 DIAGNOSIS — I712 Thoracic aortic aneurysm, without rupture: Secondary | ICD-10-CM | POA: Diagnosis not present

## 2021-02-15 DIAGNOSIS — Z79899 Other long term (current) drug therapy: Secondary | ICD-10-CM

## 2021-02-15 DIAGNOSIS — I10 Essential (primary) hypertension: Secondary | ICD-10-CM | POA: Diagnosis not present

## 2021-02-15 DIAGNOSIS — E785 Hyperlipidemia, unspecified: Secondary | ICD-10-CM | POA: Diagnosis not present

## 2021-02-15 NOTE — Telephone Encounter (Signed)
Left message for pt regarding labs needed for Coronary CTA.

## 2021-02-16 ENCOUNTER — Telehealth: Payer: Self-pay | Admitting: Cardiovascular Disease

## 2021-02-16 NOTE — Telephone Encounter (Signed)
Steroids can acutely affect blood pressure and cholesterol - but only for a short period unless they are taken daily.  No issue with the upcoming CT scan.  Dr Lemmie Evens

## 2021-02-16 NOTE — Telephone Encounter (Signed)
Spoke with patient. She is concerned that her cholesterol panel was elevated and her blood pressure was high on Saturday. She reports blood pressure is fine now. She wants to address with Dr. Debara Pickett if the cortisone shots she gets every 3 months is affecting her cholesterol and blood pressure.   Also asked if she can have her CT scan even though she had cortisone injections last week. Informed patient CT scan is fine.   Will route to Dr. Debara Pickett to review on 2/24 during office visit.

## 2021-02-16 NOTE — Telephone Encounter (Signed)
New Message:     Pt said she had 2 Cardisone shots last Thursday, she wants to know if that will effect the CT Angio she is having this Wednesday? If she is not there, please just leave a message.

## 2021-02-17 ENCOUNTER — Ambulatory Visit (HOSPITAL_BASED_OUTPATIENT_CLINIC_OR_DEPARTMENT_OTHER)
Admission: RE | Admit: 2021-02-17 | Discharge: 2021-02-17 | Disposition: A | Discharge status: Home / Self Care | Payer: PPO | Source: Ambulatory Visit | Attending: Cardiovascular Disease | Admitting: Cardiovascular Disease

## 2021-02-17 ENCOUNTER — Other Ambulatory Visit: Payer: Self-pay

## 2021-02-17 ENCOUNTER — Encounter (HOSPITAL_BASED_OUTPATIENT_CLINIC_OR_DEPARTMENT_OTHER): Payer: Self-pay

## 2021-02-17 DIAGNOSIS — I712 Thoracic aortic aneurysm, without rupture, unspecified: Secondary | ICD-10-CM

## 2021-02-17 DIAGNOSIS — J9811 Atelectasis: Secondary | ICD-10-CM | POA: Diagnosis not present

## 2021-02-17 DIAGNOSIS — K449 Diaphragmatic hernia without obstruction or gangrene: Secondary | ICD-10-CM | POA: Diagnosis not present

## 2021-02-17 DIAGNOSIS — I517 Cardiomegaly: Secondary | ICD-10-CM | POA: Diagnosis not present

## 2021-02-17 MED ORDER — IOHEXOL 350 MG/ML SOLN
100.0000 mL | Freq: Once | INTRAVENOUS | Status: AC | PRN
  Administered 2021-02-17: 100 mL via INTRAVENOUS

## 2021-02-18 ENCOUNTER — Encounter: Payer: Self-pay | Admitting: Internal Medicine

## 2021-02-18 ENCOUNTER — Other Ambulatory Visit: Payer: Self-pay | Admitting: Internal Medicine

## 2021-02-18 ENCOUNTER — Ambulatory Visit: Payer: PPO | Admitting: Internal Medicine

## 2021-02-18 VITALS — BP 120/90 | HR 61 | Ht 65.0 in | Wt 226.0 lb

## 2021-02-18 DIAGNOSIS — Z951 Presence of aortocoronary bypass graft: Secondary | ICD-10-CM

## 2021-02-18 DIAGNOSIS — M791 Myalgia, unspecified site: Secondary | ICD-10-CM | POA: Diagnosis not present

## 2021-02-18 DIAGNOSIS — T466X5A Adverse effect of antihyperlipidemic and antiarteriosclerotic drugs, initial encounter: Secondary | ICD-10-CM | POA: Diagnosis not present

## 2021-02-18 DIAGNOSIS — E785 Hyperlipidemia, unspecified: Secondary | ICD-10-CM

## 2021-02-18 DIAGNOSIS — Z952 Presence of prosthetic heart valve: Secondary | ICD-10-CM

## 2021-02-18 MED ORDER — PRALUENT 150 MG/ML ~~LOC~~ SOAJ: 1.0000 | SUBCUTANEOUS | 11 refills | Status: DC

## 2021-02-18 NOTE — Patient Instructions (Signed)
Medication Instructions:  Dr. Debara Pickett recommends Praluent 150mg /mL (PCSK9). This is an injectable cholesterol medication self-administered once every 14 days. This medication will likely need prior approval with your insurance company, which we will work on. If the medication is not approved initially, we may need to do an appeal with your insurance.   Administer medication in area of fatty tissue such as abdomen, outer thigh, back of upper arm - and rotate site with each injection Store medication in refrigerator until ready to administer - allow to sit at room temp for 30 mins - 1 hour prior to injection Dispose of medication in a SHARPS container - your pharmacy should be able to direct you on this and proper disposal   If you need co-pay assistance grant, please look into the program at healthwellfoundation.org >> disease funds >> hypercholesterolemia. This is an online application or you can call to complete. Once approved, you will provide the "pharmacy card" information to your pharmacy and they will deduct the co-pays from this grant.   *If you need a refill on your cardiac medications before your next appointment, please call your pharmacy*   Lab Work: FASTING lipid panel in 3-4 months to check cholesterol   If you have labs (blood work) drawn today and your tests are completely normal, you will receive your results only by: Marland Kitchen MyChart Message (if you have MyChart) OR . A paper copy in the mail If you have any lab test that is abnormal or we need to change your treatment, we will call you to review the results.   Testing/Procedures: NONE   Follow-Up: At [facility], you and your health needs are our priority.  As part of our continuing mission to provide you with exceptional heart care, we have created designated Provider Care Teams.  These Care Teams include your primary Cardiologist (physician) and Advanced Practice Providers (APPs -  Physician Assistants and Nurse  Practitioners) who all work together to provide you with the care you need, when you need it.  We recommend signing up for the patient portal called "MyChart".  Sign up information is provided on this After Visit Summary.  MyChart is used to connect with patients for Virtual Visits (Telemedicine).  Patients are able to view lab/test results, encounter notes, upcoming appointments, etc.  Non-urgent messages can be sent to your provider as well.   To learn more about what you can do with MyChart, go to [email or web address].    Your next appointment:   4 month(s) - lipid clinic  The format for your next appointment:   In Person  Provider:   K. Mali Hilty, MD   Other Instructions

## 2021-02-18 NOTE — Telephone Encounter (Signed)
Patient seen in office 02/18/21

## 2021-02-18 NOTE — Progress Notes (Addendum)
LIPID CLINIC CONSULT NOTE  Chief Complaint:  Follow-up dyslipidemia  Primary Care Physician: Drosinis, Pamalee Leyden, PA-C  Primary Cardiologist:  Quay Burow, MD  HPI:  [name] [name] is a 75 y.o. female who is being seen today for the evaluation of dyslipidemia at the request of Quay Burow, MD. [name] [name] is a 75 y.o. female who presents via audio/video conferencing for a telehealth visit today.  This is a pleasant 75 year old female with longstanding dyslipidemia.  She has a history of high LDL cholesterol as well as high triglycerides, once over 800.  More recently she had progressive aortic valve disease and developed severe aortic stenosis and was found to have two-vessel coronary disease.  She underwent CABG x2, with LIMA to LAD and SVG to PDA and replacement of the aortic valve.  She has been relatively intolerant to statins but currently is on low-dose rosuvastatin 10 mg twice weekly.  She said even with this she noted some worsening of her muscle aches and joint pain.  She also has some advanced arthritis.  After stopping the statin for about a week she noted some improvement.  Previously she had been on Repatha with marked improvement in her numbers and achieved an LDL of 42, however she did report side effects including migraine headaches, worsening of IBS symptoms and hair loss which she said improved significantly after stopping the injections 4 months.  She came off of the [place] back in December and recently had repeat labs in March 2021, that demonstrated total cholesterol 177, triglycerides 225, HDL 45 and LDL 94.  This represents therapy on rosuvastatin 10 mg twice weekly.  She reports diet has been stable if not become somewhat healthier as she has gotten older.  08/13/2020  [name] [name] returns today for office follow-up.  I last seen her virtually.  Plan was to adjust her statin regimen due to side effects and I recommended taking rosuvastatin 5 mg every Monday  Wednesday and Friday and adding ezetimibe 10 mg daily to her regimen.  This has resulted in overall reduction in her lipids.  Total cholesterol now 165, triglycerides 223, HDL 40 and LDL 87.  Her hemoglobin A1c is 5.9.  She still remains above target LDL and her triglycerides are higher than ideal.  We discussed other possible options including adding Vascepa.  She seemed interested but was concerned about issues as particular swallowing pills given her history of IBS and other side effects.   02/18/2021  [name] [name] returns for follow-up.  Unfortunately she could not tolerate Repatha, rosuvastatin or Vascepa due to side effects. Cholesterol is much higher. Total is up to 271 from 165 and her LDL is up to 169 from 87.  She will need additional therapies.  We discussed the possibility of Praluent which although is very similar can be tolerated slightly different.  This may be an option for her.  We could also consider evaluation for inclisiran.   PMHx:  Past Medical History:  Diagnosis Date  . Aortic stenosis, severe 08/2018   progressed from moderate by ECHO 01-03-17   . Arthritis   . Cancer ([place])    basal cell skin cancer  . Carotid artery bruit   . Cataract    bilateral repaired  . Claustrophobia   . Coronary artery disease   . Diverticulosis   . Family history of heart disease   . GERD (gastroesophageal reflux disease)   . Gout   . Heart murmur    repaired  .  History of hiatal hernia   . History of kidney stones   . Hyperlipemia   . Hypertension   . Irritable bowel syndrome   . Stroke [facility])    old- 25 yrs ago found on a test.  . Thyroid disease   . UTI (urinary tract infection) 03/09/14    Past Surgical History:  Procedure Laterality Date  . AORTIC VALVE REPLACEMENT N/A 09/24/2018   Procedure: AORTIC VALVE REPLACEMENT (AVR) using  an INSPIRIS RESILIA  Prosthetic Valve (Model #: 11500A, Size: 23MM, Serial #: [id number]);  Surgeon: Melrose Nakayama, MD;  Location: [place];  Service:  Open Heart Surgery;  Laterality: N/A;  . APPENDECTOMY     taken out with hysterectomy  . BLADDER SURGERY     Lift   . CARDIAC CATHETERIZATION    . COLONOSCOPY  2015  . CORONARY ARTERY BYPASS GRAFT N/A 09/24/2018   Procedure: CORONARY ARTERY BYPASS GRAFTING (CABG) x 2; (LIMA to LAD, SVG to PDA) using RIGHT THIGH GREATER SAPHENOUS VEIN GRAFT (SVG) and LEFT INTERNAL MAMMARY ARTERY (LIMA): LIMA to LAD, SVG to PDA ;  Surgeon: Melrose Nakayama, MD;  Location: [place];  Service: Open Heart Surgery;  Laterality: N/A;  . EYE SURGERY Bilateral    cataract surgery with lens implants  . lithotripsy   within last 10 years  . OOPHORECTOMY    . PARTIAL HYSTERECTOMY    . RIGHT/LEFT HEART CATH AND CORONARY ANGIOGRAPHY N/A 09/10/2018   Procedure: RIGHT/LEFT HEART CATH AND CORONARY ANGIOGRAPHY;  Surgeon: Lorretta Harp, MD;  Location: [facility];  Service: Cardiovascular;  Laterality: N/A;  . TEE WITHOUT CARDIOVERSION N/A 09/24/2018   Procedure: TRANSESOPHAGEAL ECHOCARDIOGRAM (TEE);  Surgeon: Melrose Nakayama, MD;  Location: [place];  Service: Open Heart Surgery;  Laterality: N/A;  . TOTAL KNEE ARTHROPLASTY Right 07/24/2017   Procedure: RIGHT TOTAL KNEE ARTHROPLASTY;  Surgeon: Gaynelle Arabian, MD;  Location: [facility];  Service: Orthopedics;  Laterality: Right;  Adductor Block    FAMHx:  Family History  Problem Relation Age of Onset  . Heart disease Mother   . Heart disease Father   . Colon cancer Neg Hx   . Colon polyps Neg Hx   . Esophageal cancer Neg Hx   . Rectal cancer Neg Hx   . Stomach cancer Neg Hx     SOCHx:   reports that she quit smoking about 33 years ago. Her smoking use included cigarettes. She has a 16.00 pack-year smoking history. She has never used smokeless tobacco. She reports that she does not drink alcohol and does not use drugs.  ALLERGIES:  Allergies  Allergen Reactions  . Repatha [Evolocumab]     Migraine, IBS flaire up, and hair loss  . Icosapent Ethyl Other  (See Comments)      patient states gives  "gas"  . Rosuvastatin Other (See Comments)    Muscle aches  . Amoxicillin Rash    Has patient had a PCN reaction causing immediate rash, facial/tongue/throat swelling, SOB or lightheadedness with hypotension: No Has patient had a PCN reaction causing severe rash involving mucus membranes or skin necrosis: No Has patient had a PCN reaction that required hospitalization: No Has patient had a PCN reaction occurring within the last 10 years: No If all of the above answers are "NO", then may proceed with Cephalosporin use.    Marland Kitchen Amoxicillin-Pot Clavulanate Rash    Has patient had a PCN reaction causing immediate rash, facial/tongue/throat swelling, SOB or lightheadedness with hypotension:  No Has patient had a PCN reaction causing severe rash involving mucus membranes or skin necrosis: No Has patient had a PCN reaction that required hospitalization: No Has patient had a PCN reaction occurring within the last 10 years: No If all of the above answers are "NO", then may proceed with Cephalosporin use.   . Ampicillin Rash  . Clarithromycin Rash  . Clopidogrel Bisulfate Rash  . Nitrofurantoin Itching  . Plavix [Clopidogrel Bisulfate] Rash  . Zestril [Lisinopril] Rash    ROS: Pertinent items noted in HPI and remainder of comprehensive ROS otherwise negative.  HOME MEDS: Current Outpatient Medications on File Prior to Visit  Medication Sig Dispense Refill  . allopurinol (ZYLOPRIM) 100 MG tablet Take 1 tablet (100 mg total) by mouth daily. 30 tablet 0  . aspirin 81 MG EC tablet Take 1 tablet (81 mg total) by mouth daily.    . cephALEXin (KEFLEX) 500 MG capsule Take all 4 tablets one hour prior to dental procedure 4 capsule 6  . Cholecalciferol (VITAMIN D) 2000 units tablet Take 1 tablet by mouth daily.    Marland Kitchen dicyclomine (BENTYL) 10 MG capsule Take 10 mg by mouth 4 (four) times daily as needed for spasms.     . hydrochlorothiazide (MICROZIDE) 12.5 MG  capsule Take 1 capsule by mouth once daily 90 capsule 0  . levothyroxine (SYNTHROID) 100 MCG tablet Take 100 mcg by mouth daily before breakfast.    . losartan (COZAAR) 50 MG tablet Take 1 tablet (50 mg total) by mouth daily. 90 tablet 0  . meclizine (ANTIVERT) 25 MG tablet Take 25 mg by mouth 3 (three) times daily as needed for dizziness.     . metoprolol tartrate (LOPRESSOR) 25 MG tablet Take 1 tablet by mouth twice daily 180 tablet 3  . Propylene Glycol 0.6 % SOLN Place 1 drop into both eyes 2 (two) times daily as needed (dry eyes).    . tolterodine (DETROL LA) 4 MG 24 hr capsule Take 4 mg by mouth daily.    . traMADol (ULTRAM) 50 MG tablet Take 50 mg by mouth every 4-6 hours PRN severe pain 30 tablet 0  . trimethoprim (TRIMPEX) 100 MG tablet Take 100 mg by mouth daily as needed (For UTI Prevention).     Marland Kitchen amLODipine (NORVASC) 5 MG tablet Take 1 tablet (5 mg total) by mouth daily. (Patient taking differently: Take 5 mg by mouth daily. Patient takes 10 mg prn.) 90 tablet 3  . ezetimibe (ZETIA) 10 MG tablet Take 1 tablet (10 mg total) by mouth daily. 90 tablet 3   No current facility-administered medications on file prior to visit.    LABS/IMAGING: No results found for this or any previous visit (from the past 48 hour(s)). No results found.  LIPID PANEL:    Component Value Date/Time   CHOL 271 (H) 02/15/2021 0950   TRIG 262 (H) 02/15/2021 0950   HDL 53 02/15/2021 0950   CHOLHDL 5.1 (H) 02/15/2021 0950   LDLCALC 169 (H) 02/15/2021 0950    WEIGHTS: Wt Readings from Last 3 Encounters:  02/18/21 236 lb (107 kg)  12/17/20 221 lb (100.2 kg)  08/13/20 228 lb (103.4 kg)    VITALS: BP 120/90   Pulse 61   Ht 5\' 5"  (1.651 m)   Wt 236 lb (107 kg)   SpO2 90%   BMI 39.27 kg/m   EXAM: Deferred  EKG: Deferred  ASSESSMENT: 1. Mixed dyslipidemia, goal LDL less than 70 2. Relative statin intolerance-myalgias 3. Repatha  intolerant 4. Coronary artery disease status post CABG  x2 5. Aortic stenosis status post AVR  PLAN: 1.   Ms. Morss has been intolerant to Repatha and also recently could not take rosuvastatin and Vascepa due to side effects.  Her cholesterol accordingly is gone up significantly.  Total is up to 271 from 165 and her LDL is up to 169 from 87.  She will need additional therapy.  We discussed options today including the possibility of Praluent as an alternative to the [place] or [place], which was recently FDA approved.  She is willing to try Praluent 150 mg every 2 weeks.  We will reach out for prior authorization for this.  We should be able to give her a sample to use today in the clinic.  Repeat lipids in about 3 to 4 months.  Plan follow-up at that time  Pixie Casino, MD, FACC, [place] Director of the [facility] Lipid Disorders &  Cardiovascular Risk Reduction Clinic Diplomate of the American Board of Clinical Lipidology Attending Cardiologist  Direct Dial: [phone]  Fax: [phone]  Website:  [email or web address] 02/18/2021, 11:48 AM

## 2021-02-22 ENCOUNTER — Telehealth: Payer: Self-pay | Admitting: Internal Medicine

## 2021-02-22 ENCOUNTER — Other Ambulatory Visit (HOSPITAL_COMMUNITY): Payer: PPO

## 2021-02-22 LAB — BASIC METABOLIC PANEL
BUN/Creatinine Ratio: 25 (ref 12–28)
BUN: 20 mg/dL (ref 8–27)
CO2: 23 mmol/L (ref 20–29)
Calcium: 10 mg/dL (ref 8.7–10.3)
Chloride: 100 mmol/L (ref 96–106)
Creatinine, Ser: 0.8 mg/dL (ref 0.57–1.00)
GFR calc Af Amer: 82 mL/min/{1.73_m2} (ref >59)
GFR calc non Af Amer: 71 mL/min/{1.73_m2} (ref >59)
Glucose: 101 mg/dL — ABNORMAL HIGH (ref 65–99)
Potassium: 4.7 mmol/L (ref 3.5–5.2)
Sodium: 140 mmol/L (ref 134–144)

## 2021-02-22 LAB — HEMOGLOBIN A1C
Est. average glucose Bld gHb Est-mCnc: 128 mg/dL
Hgb A1c MFr Bld: 6.1 % — ABNORMAL HIGH (ref 4.8–5.6)

## 2021-02-22 LAB — VITAMIN D 1,25 DIHYDROXY
Vitamin D 1, 25 (OH)2 Total: 68 pg/mL — ABNORMAL HIGH
Vitamin D2 1, 25 (OH)2: <10 pg/mL
Vitamin D3 1, 25 (OH)2: 67 pg/mL

## 2021-02-22 LAB — LIPID PANEL
Chol/HDL Ratio: 5.1 ratio — ABNORMAL HIGH (ref 0.0–4.4)
Cholesterol, Total: 271 mg/dL — ABNORMAL HIGH (ref 100–199)
HDL: 53 mg/dL (ref >39)
LDL Chol Calc (NIH): 169 mg/dL — ABNORMAL HIGH (ref 0–99)
Triglycerides: 262 mg/dL — ABNORMAL HIGH (ref 0–149)
VLDL Cholesterol Cal: 49 mg/dL — ABNORMAL HIGH (ref 5–40)

## 2021-02-22 NOTE — Telephone Encounter (Signed)
Late entry (from 02/18/21)  Patient seen in office on 02/18/21. Dr. Debara Pickett prescribed Praluent 150mg /mL for patient. She was provided with sample in the office and it was administered. No reaction observed. Well-tolerated during time in office.

## 2021-02-22 NOTE — Telephone Encounter (Signed)
Attempted to generate a PA request for Praluent 150mg /mL via [email or web address] Unable to submit Plan's phone # is [phone]

## 2021-02-24 ENCOUNTER — Ambulatory Visit (HOSPITAL_BASED_OUTPATIENT_CLINIC_OR_DEPARTMENT_OTHER)
Admission: RE | Admit: 2021-02-24 | Discharge: 2021-02-24 | Disposition: A | Discharge status: Home / Self Care | Payer: PPO | Source: Ambulatory Visit | Attending: Cardiovascular Disease | Admitting: Cardiovascular Disease

## 2021-02-24 ENCOUNTER — Other Ambulatory Visit: Payer: Self-pay

## 2021-02-24 DIAGNOSIS — I350 Nonrheumatic aortic (valve) stenosis: Secondary | ICD-10-CM

## 2021-02-24 DIAGNOSIS — Z953 Presence of xenogenic heart valve: Secondary | ICD-10-CM

## 2021-02-24 LAB — ECHOCARDIOGRAM COMPLETE
AR max vel: 1.47 cm2
AV Area VTI: 1.66 cm2
AV Area mean vel: 1.62 cm2
AV Mean grad: 10.5 mmHg
AV Peak grad: 18.9 mmHg
Ao pk vel: 2.18 m/s
Area-P 1/2: 2.76 cm2
MV M vel: 5.04 m/s
MV Peak grad: 101.4 mmHg
S' Lateral: 2.65 cm

## 2021-02-24 NOTE — Telephone Encounter (Signed)
Faxed PA for praluent to [facility] @ [phone] w/MD note

## 2021-02-25 ENCOUNTER — Telehealth: Payer: Self-pay | Admitting: Cardiovascular Disease

## 2021-02-25 NOTE — Telephone Encounter (Signed)
Pt called in for a couple things:  1) she stated that she was seen yesterday with dr Debara Pickett and she noticed when she got home her Weight was wrong on her avs.  She would like that changed .  It show 236 and she said it should be 226  2) she also had a ECho and would like some Dr Gwenlyn Found nurse to call her about that .  She was concerned  About the "2019 dates being in the body of her Echo "  She was concerened it was dated incorrectly ,  I ensured her it was not but she insist on a explanation and results     best number [phone]

## 2021-02-25 NOTE — Telephone Encounter (Signed)
Spoke with pt, aware weight changed in his note. Questions regarding echo from yesterday answered.

## 2021-03-02 NOTE — Telephone Encounter (Signed)
Fax received from [facility] Patient approved for praluent 150mg /mL until 12/25/2021 Patient notified via MyChart message

## 2021-03-03 DIAGNOSIS — L821 Other seborrheic keratosis: Secondary | ICD-10-CM | POA: Diagnosis not present

## 2021-03-03 DIAGNOSIS — B353 Tinea pedis: Secondary | ICD-10-CM | POA: Diagnosis not present

## 2021-03-03 DIAGNOSIS — L570 Actinic keratosis: Secondary | ICD-10-CM | POA: Diagnosis not present

## 2021-03-03 DIAGNOSIS — Z85828 Personal history of other malignant neoplasm of skin: Secondary | ICD-10-CM | POA: Diagnosis not present

## 2021-03-03 DIAGNOSIS — D225 Melanocytic nevi of trunk: Secondary | ICD-10-CM | POA: Diagnosis not present

## 2021-03-03 DIAGNOSIS — D2271 Melanocytic nevi of right lower limb, including hip: Secondary | ICD-10-CM | POA: Diagnosis not present

## 2021-03-03 DIAGNOSIS — L578 Other skin changes due to chronic exposure to nonionizing radiation: Secondary | ICD-10-CM | POA: Diagnosis not present

## 2021-03-03 DIAGNOSIS — Z808 Family history of malignant neoplasm of other organs or systems: Secondary | ICD-10-CM | POA: Diagnosis not present

## 2021-03-08 DIAGNOSIS — N390 Urinary tract infection, site not specified: Secondary | ICD-10-CM | POA: Diagnosis not present

## 2021-03-08 DIAGNOSIS — I10 Essential (primary) hypertension: Secondary | ICD-10-CM | POA: Diagnosis not present

## 2021-03-08 DIAGNOSIS — E781 Pure hyperglyceridemia: Secondary | ICD-10-CM | POA: Diagnosis not present

## 2021-03-08 DIAGNOSIS — R7309 Other abnormal glucose: Secondary | ICD-10-CM | POA: Diagnosis not present

## 2021-03-08 DIAGNOSIS — E782 Mixed hyperlipidemia: Secondary | ICD-10-CM | POA: Diagnosis not present

## 2021-03-08 DIAGNOSIS — I350 Nonrheumatic aortic (valve) stenosis: Secondary | ICD-10-CM | POA: Diagnosis not present

## 2021-03-08 DIAGNOSIS — R011 Cardiac murmur, unspecified: Secondary | ICD-10-CM | POA: Diagnosis not present

## 2021-03-08 DIAGNOSIS — E039 Hypothyroidism, unspecified: Secondary | ICD-10-CM | POA: Diagnosis not present

## 2021-03-08 DIAGNOSIS — E559 Vitamin D deficiency, unspecified: Secondary | ICD-10-CM | POA: Diagnosis not present

## 2021-03-08 DIAGNOSIS — E7800 Pure hypercholesterolemia, unspecified: Secondary | ICD-10-CM | POA: Diagnosis not present

## 2021-03-08 DIAGNOSIS — M159 Polyosteoarthritis, unspecified: Secondary | ICD-10-CM | POA: Diagnosis not present

## 2021-03-10 ENCOUNTER — Encounter: Payer: Self-pay | Admitting: Cardiovascular Disease

## 2021-03-10 ENCOUNTER — Ambulatory Visit: Payer: PPO | Admitting: Cardiovascular Disease

## 2021-03-10 ENCOUNTER — Other Ambulatory Visit: Payer: Self-pay

## 2021-03-10 VITALS — BP 122/78 | HR 81 | Ht 60.0 in | Wt 227.0 lb

## 2021-03-10 DIAGNOSIS — E7800 Pure hypercholesterolemia, unspecified: Secondary | ICD-10-CM

## 2021-03-10 DIAGNOSIS — I350 Nonrheumatic aortic (valve) stenosis: Secondary | ICD-10-CM | POA: Diagnosis not present

## 2021-03-10 DIAGNOSIS — Z951 Presence of aortocoronary bypass graft: Secondary | ICD-10-CM | POA: Diagnosis not present

## 2021-03-10 DIAGNOSIS — I10 Essential (primary) hypertension: Secondary | ICD-10-CM

## 2021-03-10 NOTE — Progress Notes (Signed)
03/10/2021 [name] [name]   [birth year]  [id number]  Primary Physician Drosinis, Pamalee Leyden, PA-C Primary Cardiologist: Lorretta Harp MD FACP, [place], [place], [place]  HPI:  [name] [name] is a 75 y.o.  mildly overweight married Caucasian female para 3, her mother to 57 grandchildrenwho I last saw in the office  03/04/2020.Is accompanied by her daughter [name] [name] today. Shewasself-referred to be established in our practice, a friend of hers is mypatient. She is retired from [employer] in [place] and moved to [place] 17 years ago here at her primary care physician is Dr. Clyda Hurdle. Her cardiovascular risk factor profile is notable for family history of heart disease with both parents died of myocardial infarctions in their 73s and 36s and a brother who died of an MI at 75. She has never had a heart attack or a clinical stroke. Risk factors otherwise remarkable for treated hypertension and hyperlipidemia. She does have GERD. She had negative nuclear stress test and [facility]. She does have a history of a murmurand had a recent 2-D echo performed 01/30/18 revealing progression of her aortic stenosis now with a peak gradient of 70 and a valve area 1.17 cm. She does complain of some dyspnea on exertion. She denies chest pain. Because of symptoms of dyspnea as well as a 2D echo that revealed severe lAS.I performed outpatient cardiac cath on her 09/10/2018 revealing a total mid RCA with high-grade proximal LAD disease and severe aortic stenosis. She ultimately underwent CABG x2 with a LIMA to her LAD and a vein to the PDA as well as pericardial tissue aortic valve replacement by Dr. Roxan Hockey 09/24/2018 and was discharged a week later. She is recuperating nicely. Her symptoms preoperative dyspnea have resolved.  She had a 2D echo performed 01/21/2019 that revealed normal LV systolic function with a  well-functioning aortic bioprosthesis. The thoracic aorta measured 4.7 cm on that study and therefore follow-up CT of her chest was done to confirm this which only showed the aortic dimension of 41 mm. She still still complains of some shortness of breath but markedly improved since her aortic valve replacement.  She recently returned from a 9-day trip to [facility] which was therapeutic for her.  Since I saw her a year ago she continues to do well.  She denies chest pain or shortness of breath.  She is seeing Dr. Debara Pickett in the lipid clinic unfortunately is intolerant to statin therapy as well as Repatha.  She is scheduled to start Praluent in the next several weeks.  2D echo performed 02/24/2021 revealed normal LV systolic function with a well functioning aortic bioprosthesis and chest CTA performed 02/17/2021 revealed slight decrease in her thoracic aortic measurements at 38 mm.   Current Meds  Medication Sig  . allopurinol (ZYLOPRIM) 100 MG tablet Take 1 tablet (100 mg total) by mouth daily.  Marland Kitchen amLODipine (NORVASC) 5 MG tablet Take 1 tablet (5 mg total) by mouth daily. (Patient taking differently: Take 5 mg by mouth daily. Patient takes 10 mg prn.)  . aspirin 81 MG EC tablet Take 1 tablet (81 mg total) by mouth daily.  . cephALEXin (KEFLEX) 500 MG capsule Take all 4 tablets one hour prior to dental procedure  . Cholecalciferol (VITAMIN D) 2000 units tablet Take 1 tablet by mouth daily.  Marland Kitchen dicyclomine (BENTYL) 10 MG capsule Take 10 mg by mouth 4 (four) times daily as needed for spasms.   Marland Kitchen  hydrochlorothiazide (MICROZIDE) 12.5 MG capsule Take 1 capsule by mouth once daily  . levothyroxine (SYNTHROID) 100 MCG tablet Take 100 mcg by mouth daily before breakfast.  . losartan (COZAAR) 50 MG tablet Take 1 tablet (50 mg total) by mouth daily.  . meclizine (ANTIVERT) 25 MG tablet Take 25 mg by mouth 3 (three) times daily as needed for dizziness.   . metoprolol tartrate (LOPRESSOR) 25 MG tablet Take  1 tablet by mouth twice daily  . PRALUENT 150 MG/ML SOAJ INJECT 1 DOSE INTO THE SKIN EVERY 14 DAYS.  Marland Kitchen Propylene Glycol 0.6 % SOLN Place 1 drop into both eyes 2 (two) times daily as needed (dry eyes).  . tolterodine (DETROL LA) 4 MG 24 hr capsule Take 4 mg by mouth daily.  . traMADol (ULTRAM) 50 MG tablet Take 50 mg by mouth every 4-6 hours PRN severe pain  . trimethoprim (TRIMPEX) 100 MG tablet Take 100 mg by mouth daily as needed (For UTI Prevention).      Allergies  Allergen Reactions  . Repatha [Evolocumab]     Migraine, IBS flaire up, and hair loss  . Icosapent Ethyl Other (See Comments)      patient states gives  "gas"  . Rosuvastatin Other (See Comments)    Muscle aches  . Amoxicillin Rash    Has patient had a PCN reaction causing immediate rash, facial/tongue/throat swelling, SOB or lightheadedness with hypotension: No Has patient had a PCN reaction causing severe rash involving mucus membranes or skin necrosis: No Has patient had a PCN reaction that required hospitalization: No Has patient had a PCN reaction occurring within the last 10 years: No If all of the above answers are "NO", then may proceed with Cephalosporin use.    Marland Kitchen Amoxicillin-Pot Clavulanate Rash    Has patient had a PCN reaction causing immediate rash, facial/tongue/throat swelling, SOB or lightheadedness with hypotension: No Has patient had a PCN reaction causing severe rash involving mucus membranes or skin necrosis: No Has patient had a PCN reaction that required hospitalization: No Has patient had a PCN reaction occurring within the last 10 years: No If all of the above answers are "NO", then may proceed with Cephalosporin use.   . Ampicillin Rash  . Clarithromycin Rash  . Clopidogrel Bisulfate Rash  . Nitrofurantoin Itching  . Plavix [Clopidogrel Bisulfate] Rash  . Zestril [Lisinopril] Rash    Social History   Socioeconomic History  . Marital status: Widowed    Spouse name: Not on file  .  Number of children: Not on file  . Years of education: Not on file  . Highest education level: Not on file  Occupational History  . Occupation: Retired  Tobacco Use  . Smoking status: Former Smoker    Packs/day: 0.80    Years: 20.00    Pack years: 16.00    Types: Cigarettes    Quit date: 09/21/1987    Years since quitting: 33.4  . Smokeless tobacco: Never Used  . Tobacco comment: socially  Vaping Use  . Vaping Use: Never used  Substance and Sexual Activity  . Alcohol use: No  . Drug use: No  . Sexual activity: Not on file  Other Topics Concern  . Not on file  Social History Narrative  . Not on file   Social Determinants of Health   Financial Resource Strain: Not on file  Food Insecurity: Not on file  Transportation Needs: Not on file  Physical Activity: Not on file  Stress: Not on file  Social Connections: Not on file  Intimate Partner Violence: Not on file     Review of Systems: General: negative for chills, fever, night sweats or weight changes.  Cardiovascular: negative for chest pain, dyspnea on exertion, edema, orthopnea, palpitations, paroxysmal nocturnal dyspnea or shortness of breath Dermatological: negative for rash Respiratory: negative for cough or wheezing Urologic: negative for hematuria Abdominal: negative for nausea, vomiting, diarrhea, bright red blood per rectum, melena, or hematemesis Neurologic: negative for visual changes, syncope, or dizziness All other systems reviewed and are otherwise negative except as noted above.    Blood pressure 122/78, pulse 81, height 5' (1.524 m), weight 227 lb (103 kg).  General appearance: alert and no distress Neck: no adenopathy, no carotid bruit, no JVD, supple, symmetrical, trachea midline and thyroid not enlarged, symmetric, no tenderness/mass/nodules Lungs: clear to auscultation bilaterally Heart: regular rate and rhythm, S1, S2 normal, no murmur, click, rub or gallop Extremities: extremities normal,  atraumatic, no cyanosis or edema Pulses: 2+ and symmetric Skin: Skin color, texture, turgor normal. No rashes or lesions Neurologic: Alert and oriented X 3, normal strength and tone. Normal symmetric reflexes. Normal coordination and gait  EKG sinus rhythm 81 with occasional PACs and septal Q waves.  I personally reviewed this EKG.  ASSESSMENT AND PLAN:   HYPERCHOLESTEROLEMIA History of hyperlipidemia intolerant to statin therapy as well as Repatha scheduled to start Praluent per Dr. Debara Pickett.  Essential hypertension History of essential hypertension blood pressure measured today 122/78.  She is on amlodipine, hydrochlorothiazide, and losartan.  S/P CABG x 2 History of CAD and aortic stenosis status post CABG x2 and porcine aortic valve replacement by Dr. Roxan Hockey 09/24/2018.  Her most recent 2D echo performed 02/24/2021 revealed normal LV systolic function with a well-functioning aortic bioprosthesis.  This will be repeated on annual basis.  Thoracic aortic aneurysm (HCC) History of small thoracic aortic aneurysm measuring 41 mm in the past currently measuring 38 mm by CTA on 02/18/2021.  This will not need to be repeated for another 2 years.      Lorretta Harp MD FACP,FACC,FAHA, [facility] 03/10/2021 10:57 AM

## 2021-03-10 NOTE — Patient Instructions (Signed)
Medication Instructions:  Your physician recommends that you continue on your current medications as directed. Please refer to the Current Medication list given to you today.  *If you need a refill on your cardiac medications before your next appointment, please call your pharmacy*   Testing/Procedures: Your physician has requested that you have an echocardiogram. Echocardiography is a painless test that uses sound waves to create images of your heart. It provides your doctor with information about the size and shape of your heart and how well your heart's chambers and valves are working. This procedure takes approximately one hour. There are no restrictions for this procedure. This procedure is done at [address]  -To be done in March 2023.     Follow-Up: At [facility], you and your health needs are our priority.  As part of our continuing mission to provide you with exceptional heart care, we have created designated Provider Care Teams.  These Care Teams include your primary Cardiologist (physician) and Advanced Practice Providers (APPs -  Physician Assistants and Nurse Practitioners) who all work together to provide you with the care you need, when you need it.  We recommend signing up for the patient portal called "MyChart".  Sign up information is provided on this After Visit Summary.  MyChart is used to connect with patients for Virtual Visits (Telemedicine).  Patients are able to view lab/test results, encounter notes, upcoming appointments, etc.  Non-urgent messages can be sent to your provider as well.   To learn more about what you can do with MyChart, go to [email or web address].    Your next appointment:   12 month(s)  The format for your next appointment:   In Person  Provider:   Quay Burow, MD

## 2021-03-10 NOTE — Assessment & Plan Note (Signed)
History of CAD and aortic stenosis status post CABG x2 and porcine aortic valve replacement by Dr. Roxan Hockey 09/24/2018.  Her most recent 2D echo performed 02/24/2021 revealed normal LV systolic function with a well-functioning aortic bioprosthesis.  This will be repeated on annual basis.

## 2021-03-10 NOTE — Assessment & Plan Note (Signed)
History of hyperlipidemia intolerant to statin therapy as well as Repatha scheduled to start Praluent per Dr. Debara Pickett.

## 2021-03-10 NOTE — Assessment & Plan Note (Signed)
History of essential hypertension blood pressure measured today 122/78.  She is on amlodipine, hydrochlorothiazide, and losartan.

## 2021-03-10 NOTE — Assessment & Plan Note (Signed)
History of small thoracic aortic aneurysm measuring 41 mm in the past currently measuring 38 mm by CTA on 02/18/2021.  This will not need to be repeated for another 2 years.

## 2021-03-26 ENCOUNTER — Other Ambulatory Visit: Payer: Self-pay | Admitting: Physician Assistant

## 2021-03-26 ENCOUNTER — Other Ambulatory Visit: Payer: Self-pay | Admitting: Cardiovascular Disease

## 2021-03-30 DIAGNOSIS — F418 Other specified anxiety disorders: Secondary | ICD-10-CM | POA: Diagnosis not present

## 2021-03-31 ENCOUNTER — Telehealth: Payer: Self-pay | Admitting: Cardiovascular Disease

## 2021-03-31 NOTE — Telephone Encounter (Signed)
Patient wanted to know if Dr. Gwenlyn Found would want her to get a COVID Booster. Please advise

## 2021-03-31 NOTE — Telephone Encounter (Signed)
Patient aware she should consult PCP regarding covid booster and if appropriate/recommended for her

## 2021-04-19 DIAGNOSIS — B353 Tinea pedis: Secondary | ICD-10-CM | POA: Diagnosis not present

## 2021-04-19 DIAGNOSIS — L821 Other seborrheic keratosis: Secondary | ICD-10-CM | POA: Diagnosis not present

## 2021-04-21 ENCOUNTER — Other Ambulatory Visit: Payer: Self-pay | Admitting: Internal Medicine

## 2021-05-25 ENCOUNTER — Telehealth: Payer: Self-pay | Admitting: Internal Medicine

## 2021-05-25 DIAGNOSIS — Z79899 Other long term (current) drug therapy: Secondary | ICD-10-CM

## 2021-05-25 DIAGNOSIS — E7800 Pure hypercholesterolemia, unspecified: Secondary | ICD-10-CM

## 2021-05-25 DIAGNOSIS — E785 Hyperlipidemia, unspecified: Secondary | ICD-10-CM

## 2021-05-25 NOTE — Telephone Encounter (Signed)
Message relayed to patient via MyChart

## 2021-05-25 NOTE — Telephone Encounter (Signed)
Okay to order A1C?   Thanks!

## 2021-05-25 NOTE — Telephone Encounter (Signed)
    Pt said she normally get her lipid, vit. D and A1c blood work with Dr. Debara Pickett, no order for A1c. She said to add it today and she will come to get lab work today in an hour

## 2021-05-25 NOTE — Telephone Encounter (Signed)
Lipid, BMET, A1c, Vit D were ordered/drawn Feb 2022  Only lipid panel was ordered at last OV  Will wait on MD reply

## 2021-05-25 NOTE — Telephone Encounter (Signed)
Ok .. maybe she should stay off the meds until she feels better and then we can discuss. Why add Crestor if she may have side effects? A month or 2 off meds is not a big issue.  Dr Lemmie Evens

## 2021-05-25 NOTE — Telephone Encounter (Signed)
Yes, ok to order A1c as well  Dr Lemmie Evens

## 2021-05-25 NOTE — Telephone Encounter (Signed)
Spoke with patient - advised what labs (fasting) have been ordered  She reports she is NOT able to take Praluent - migraines, GI issues (IB flare up)  She is asking if she can take a short course of crestor for a few months and then possible stop once her side effects becoming noticeable  -- she had to r/s her appointment to later in June -- would like to know MD opinion on this

## 2021-05-26 ENCOUNTER — Ambulatory Visit: Payer: PPO | Admitting: Internal Medicine

## 2021-05-27 ENCOUNTER — Telehealth: Payer: PPO | Admitting: Internal Medicine

## 2021-06-17 DIAGNOSIS — E785 Hyperlipidemia, unspecified: Secondary | ICD-10-CM | POA: Diagnosis not present

## 2021-06-17 DIAGNOSIS — Z79899 Other long term (current) drug therapy: Secondary | ICD-10-CM | POA: Diagnosis not present

## 2021-06-18 ENCOUNTER — Telehealth: Payer: Self-pay | Admitting: Internal Medicine

## 2021-06-21 ENCOUNTER — Telehealth: Payer: PPO | Admitting: Internal Medicine

## 2021-06-21 ENCOUNTER — Telehealth: Payer: Self-pay | Admitting: Internal Medicine

## 2021-06-21 DIAGNOSIS — E785 Hyperlipidemia, unspecified: Secondary | ICD-10-CM | POA: Diagnosis not present

## 2021-06-21 LAB — LIPID PANEL
Chol/HDL Ratio: 6.2 ratio — ABNORMAL HIGH (ref 0.0–4.4)
Cholesterol, Total: 253 mg/dL — ABNORMAL HIGH (ref 100–199)
HDL: 41 mg/dL (ref >39)
LDL Chol Calc (NIH): 161 mg/dL — ABNORMAL HIGH (ref 0–99)
Triglycerides: 274 mg/dL — ABNORMAL HIGH (ref 0–149)
VLDL Cholesterol Cal: 51 mg/dL — ABNORMAL HIGH (ref 5–40)

## 2021-06-21 NOTE — Telephone Encounter (Signed)
Spoke with patient who was scheduled for an 8am virtual visit today. She went on 06/17/21 for lab work and only A1c, BMET, Vit D were done. She questioned lab tech about this, attempted to explain she needed a lipid panel, and said she was told that she was doing what the doctor ordered. Patient states she was concerned all weekend that the lipid panel was not done. It was not drawn/resulted. We rescheduled her visit to 6/29 @ 9am (virtual) and I called [facility] to see if an add on lipid panel could be done -- was informed that there was some specimen remaining but they could not guarantee it could be done and they do not call if there is an issue. I spoke again with patient and advised she complete separate lipid panel so results can be discussed on 6/29. I apologized for this inconvenience of having multiple trips and lab draws.

## 2021-06-23 ENCOUNTER — Telehealth (INDEPENDENT_AMBULATORY_CARE_PROVIDER_SITE_OTHER): Payer: PPO | Admitting: Internal Medicine

## 2021-06-23 ENCOUNTER — Encounter: Payer: Self-pay | Admitting: Internal Medicine

## 2021-06-23 VITALS — Ht 65.0 in | Wt 218.0 lb

## 2021-06-23 DIAGNOSIS — M791 Myalgia, unspecified site: Secondary | ICD-10-CM | POA: Diagnosis not present

## 2021-06-23 DIAGNOSIS — Z952 Presence of prosthetic heart valve: Secondary | ICD-10-CM

## 2021-06-23 DIAGNOSIS — E785 Hyperlipidemia, unspecified: Secondary | ICD-10-CM | POA: Diagnosis not present

## 2021-06-23 DIAGNOSIS — T466X5D Adverse effect of antihyperlipidemic and antiarteriosclerotic drugs, subsequent encounter: Secondary | ICD-10-CM | POA: Diagnosis not present

## 2021-06-23 DIAGNOSIS — Z951 Presence of aortocoronary bypass graft: Secondary | ICD-10-CM | POA: Diagnosis not present

## 2021-06-23 NOTE — Progress Notes (Signed)
Virtual Visit via Telephone Note   This visit type was conducted due to national recommendations for restrictions regarding the COVID-19 Pandemic (e.g. social distancing) in an effort to limit this patient's exposure and mitigate transmission in our community.  Due to her co-morbid illnesses, this patient is at least at moderate risk for complications without adequate follow up.  This format is felt to be most appropriate for this patient at this time.  The patient did not have access to video technology/had technical difficulties with video requiring transitioning to audio format only (telephone).  All issues noted in this document were discussed and addressed.  No physical exam could be performed with this format.  Please refer to the patient's chart for her  consent to telehealth for [facility].   Date:  06/23/2021   ID:  [name] [name], DOB [birth year], MRN [id number] The patient was identified using 2 identifiers.  Evaluation Performed:  Follow-Up Visit  Patient Location:  [address] [place] [address] [address] [place] [address]  Provider location:   [address] [place] [address], [place] [address]  PCP:  Drosinis, Pamalee Leyden, PA-C  Cardiologist:  Quay Burow, MD Electrophysiologist:  None   Chief Complaint:  High cholesterol  History of Present Illness:    [name] [name] is a 75 y.o. female who presents via audio/video conferencing for a telehealth visit today.  [name] [name] returns today for follow-up of dyslipidemia.  Her cholesterol continues to be high.  In the past she had taken Repatha but reported she had side effects including worsening of her IBS.  A year ago however her cholesterol had come down to a total 165, triglycerides 223, HDL 40 and LDL 87.  Subsequently she stopped the medication and her cholesterol then went up to total 271, triglycerides 262 HDL 53 and LDL 169.  We discussed several options and I felt that she might want to try Praluent as an alternative, however  ultimately she did try it and says she still had side effects.  She wanted to work on diet alone.  She continues on low-dose ezetimibe.  A repeat lipid however 2 days ago show virtually no change in her lipids.  Total cholesterol now 253, triglycerides 274, HDL 41 and LDL 161.  She is quite disappointed with this and felt that she would have a better response with just diet however it seems that much of this is genetic.  We discussed some other possible options including Leqvio which is quite costly and somewhat cumbersome however we might be able to get approval, or possibly Nexletol, however she does have a history of gout and this could certainly precipitate gout.  The patient does not have symptoms concerning for COVID-19 infection (fever, chills, cough, or new SHORTNESS OF BREATH).    Prior CV studies:   The following studies were reviewed today:  Chart reviewed, lab work  PMHx:  Past Medical History:  Diagnosis Date   Aortic stenosis, severe 08/2018   progressed from moderate by ECHO 01-03-17    Arthritis    Cancer ([place])    basal cell skin cancer   Carotid artery bruit    Cataract    bilateral repaired   Claustrophobia    Coronary artery disease    Diverticulosis    Family history of heart disease    GERD (gastroesophageal reflux disease)    Gout    Heart murmur    repaired   History of hiatal hernia    History of kidney stones  Hyperlipemia    Hypertension    Irritable bowel syndrome    Stroke [facility])    old- 25 yrs ago found on a test.   Thyroid disease    UTI (urinary tract infection) 03/09/14    Past Surgical History:  Procedure Laterality Date   AORTIC VALVE REPLACEMENT N/A 09/24/2018   Procedure: AORTIC VALVE REPLACEMENT (AVR) using  an INSPIRIS RESILIA  Prosthetic Valve (Model #: 11500A, Size: 23MM, Serial #: [id number]);  Surgeon: Melrose Nakayama, MD;  Location: [place];  Service: Open Heart Surgery;  Laterality: N/A;   APPENDECTOMY     taken out with  hysterectomy   BLADDER SURGERY     Lift    CARDIAC CATHETERIZATION     COLONOSCOPY  2015   CORONARY ARTERY BYPASS GRAFT N/A 09/24/2018   Procedure: CORONARY ARTERY BYPASS GRAFTING (CABG) x 2; (LIMA to LAD, SVG to PDA) using RIGHT THIGH GREATER SAPHENOUS VEIN GRAFT (SVG) and LEFT INTERNAL MAMMARY ARTERY (LIMA): LIMA to LAD, SVG to PDA ;  Surgeon: Melrose Nakayama, MD;  Location: [place];  Service: Open Heart Surgery;  Laterality: N/A;   EYE SURGERY Bilateral    cataract surgery with lens implants   lithotripsy   within last 10 years   OOPHORECTOMY     PARTIAL HYSTERECTOMY     RIGHT/LEFT HEART CATH AND CORONARY ANGIOGRAPHY N/A 09/10/2018   Procedure: RIGHT/LEFT HEART CATH AND CORONARY ANGIOGRAPHY;  Surgeon: Lorretta Harp, MD;  Location: [place] [facility];  Service: Cardiovascular;  Laterality: N/A;   TEE WITHOUT CARDIOVERSION N/A 09/24/2018   Procedure: TRANSESOPHAGEAL ECHOCARDIOGRAM (TEE);  Surgeon: Melrose Nakayama, MD;  Location: [place];  Service: Open Heart Surgery;  Laterality: N/A;   TOTAL KNEE ARTHROPLASTY Right 07/24/2017   Procedure: RIGHT TOTAL KNEE ARTHROPLASTY;  Surgeon: Gaynelle Arabian, MD;  Location: [facility];  Service: Orthopedics;  Laterality: Right;  Adductor Block    FAMHx:  Family History  Problem Relation Age of Onset   Heart disease Mother    Heart disease Father    Colon cancer Neg Hx    Colon polyps Neg Hx    Esophageal cancer Neg Hx    Rectal cancer Neg Hx    Stomach cancer Neg Hx     SOCHx:   reports that she quit smoking about 33 years ago. Her smoking use included cigarettes. She has a 16.00 pack-year smoking history. She has never used smokeless tobacco. She reports that she does not drink alcohol and does not use drugs.  ALLERGIES:  Allergies  Allergen Reactions   Repatha [Evolocumab]     Migraine, IBS flaire up, and hair loss   Icosapent Ethyl Other (See Comments)      patient states gives  "gas"   Rosuvastatin Other (See Comments)    Muscle  aches   Amoxicillin Rash    Has patient had a PCN reaction causing immediate rash, facial/tongue/throat swelling, SOB or lightheadedness with hypotension: No Has patient had a PCN reaction causing severe rash involving mucus membranes or skin necrosis: No Has patient had a PCN reaction that required hospitalization: No Has patient had a PCN reaction occurring within the last 10 years: No If all of the above answers are "NO", then may proceed with Cephalosporin use.     Amoxicillin-Pot Clavulanate Rash    Has patient had a PCN reaction causing immediate rash, facial/tongue/throat swelling, SOB or lightheadedness with hypotension: No Has patient had a PCN reaction causing severe rash involving mucus membranes or  skin necrosis: No Has patient had a PCN reaction that required hospitalization: No Has patient had a PCN reaction occurring within the last 10 years: No If all of the above answers are "NO", then may proceed with Cephalosporin use.    Ampicillin Rash   Clarithromycin Rash   Clopidogrel Bisulfate Rash   Nitrofurantoin Itching   Plavix [Clopidogrel Bisulfate] Rash   Zestril [Lisinopril] Rash    MEDS:  Current Meds  Medication Sig   allopurinol (ZYLOPRIM) 100 MG tablet Take 1 tablet (100 mg total) by mouth daily.   amLODipine (NORVASC) 5 MG tablet Take 5 mg by mouth daily.   aspirin 81 MG EC tablet Take 1 tablet (81 mg total) by mouth daily.   cephALEXin (KEFLEX) 500 MG capsule Take all 4 tablets one hour prior to dental procedure   Cholecalciferol (VITAMIN D) 2000 units tablet Take 1 tablet by mouth daily.   dicyclomine (BENTYL) 10 MG capsule Take 10 mg by mouth 4 (four) times daily as needed for spasms.    ezetimibe (ZETIA) 10 MG tablet Take 1 tablet by mouth once daily   hydrochlorothiazide (MICROZIDE) 12.5 MG capsule Take 1 capsule by mouth once daily   levothyroxine (SYNTHROID) 100 MCG tablet Take 100 mcg by mouth daily before breakfast.   losartan (COZAAR) 50 MG tablet  Take 1 tablet (50 mg total) by mouth daily.   meclizine (ANTIVERT) 25 MG tablet Take 25 mg by mouth 3 (three) times daily as needed for dizziness.    metoprolol tartrate (LOPRESSOR) 25 MG tablet Take 1 tablet by mouth twice daily   traMADol (ULTRAM) 50 MG tablet Take 50 mg by mouth every 4-6 hours PRN severe pain   [DISCONTINUED] PRALUENT 150 MG/ML SOAJ INJECT 1 DOSE INTO THE SKIN EVERY 14 DAYS.   [DISCONTINUED] Propylene Glycol 0.6 % SOLN Place 1 drop into both eyes 2 (two) times daily as needed (dry eyes).     ROS: Pertinent items noted in HPI and remainder of comprehensive ROS otherwise negative.  Labs/Other Tests and Data Reviewed:    Recent Labs: 06/17/2021: BUN 19; Creatinine, Ser 0.80; Potassium 4.6; Sodium 140   Recent Lipid Panel Lab Results  Component Value Date/Time   CHOL 253 (H) 06/21/2021 10:03 AM   TRIG 274 (H) 06/21/2021 10:03 AM   HDL 41 06/21/2021 10:03 AM   CHOLHDL 6.2 (H) 06/21/2021 10:03 AM   LDLCALC 161 (H) 06/21/2021 10:03 AM    Wt Readings from Last 3 Encounters:  06/23/21 218 lb (98.9 kg)  03/10/21 227 lb (103 kg)  02/18/21 226 lb (102.5 kg)     Exam:    Vital Signs:  Ht 5\' 5"  (1.651 m)   Wt 218 lb (98.9 kg)   BMI 36.28 kg/m    Exam not performed due to telephone visit  ASSESSMENT & PLAN:    Mixed dyslipidemia, goal LDL less than 70 Relative statin intolerance-myalgias Repatha intolerant Coronary artery disease status post CABG x2 Aortic stenosis status post AVR IBS with recurrent symptoms  Ms. Sikkema has continued concerns about medications and worsening of her IBS symptoms.  She had discontinued Praluent due to also having concerns similar to [place] and her cholesterol however has remained elevated.  She is now very concerned about not being able to get that lower.  After discussing many options, she said she wanted to go ahead and try the Praluent again.  Her prior authorization is good until December 2022.  We will plan repeat lipids in 3  months  and follow-up with me at that time.  COVID-19 Education: The signs and symptoms of COVID-19 were discussed with the patient and how to seek care for testing (follow up with PCP or arrange E-visit).  The importance of social distancing was discussed today.  Patient Risk:   After full review of this patients clinical status, I feel that they are at least moderate risk at this time.  Time:   Today, I have spent 15 minutes with the patient with telehealth technology discussing dyslipidemia, PCSK9 inhibitors.     Medication Adjustments/Labs and Tests Ordered: Current medicines are reviewed at length with the patient today.  Concerns regarding medicines are outlined above.   Tests Ordered: Orders Placed This Encounter  Procedures   Lipid panel    Medication Changes: No orders of the defined types were placed in this encounter.   Disposition:  in 3 month(s)  Pixie Casino, MD, [facility], [place] Director of the Advanced Lipid Disorders &  Cardiovascular Risk Reduction Clinic Diplomate of the American Board of Clinical Lipidology Attending Cardiologist  Direct Dial: [phone]  Fax: [phone]  Website:  [email or web address]  Pixie Casino, MD  06/23/2021 9:57 AM

## 2021-06-23 NOTE — Patient Instructions (Addendum)
Medication Instructions:  Restart Praluent --Dr. Rod Mae nurse will be in touch regarding this  *If you need a refill on your cardiac medications before your next appointment, please call your pharmacy*   Lab Work: Please return for FASTING labs in 3 months (Lipid)  Our in office lab hours are Monday-Friday 8:00-4:00, closed for lunch 12:45-1:45 pm.  No appointment needed.  Follow-Up: At [facility], you and your health needs are our priority.  As part of our continuing mission to provide you with exceptional heart care, we have created designated Provider Care Teams.  These Care Teams include your primary Cardiologist (physician) and Advanced Practice Providers (APPs -  Physician Assistants and Nurse Practitioners) who all work together to provide you with the care you need, when you need it.  We recommend signing up for the patient portal called "MyChart".  Sign up information is provided on this After Visit Summary.  MyChart is used to connect with patients for Virtual Visits (Telemedicine).  Patients are able to view lab/test results, encounter notes, upcoming appointments, etc.  Non-urgent messages can be sent to your provider as well.   To learn more about what you can do with MyChart, go to [email or web address].    Your next appointment:   3 month(s)  The format for your next appointment:   Virtual Visit   Provider:   K. Mali Hilty, MD

## 2021-06-24 DIAGNOSIS — R300 Dysuria: Secondary | ICD-10-CM | POA: Diagnosis not present

## 2021-06-24 DIAGNOSIS — I10 Essential (primary) hypertension: Secondary | ICD-10-CM | POA: Diagnosis not present

## 2021-06-24 DIAGNOSIS — E559 Vitamin D deficiency, unspecified: Secondary | ICD-10-CM | POA: Diagnosis not present

## 2021-06-24 DIAGNOSIS — I350 Nonrheumatic aortic (valve) stenosis: Secondary | ICD-10-CM | POA: Diagnosis not present

## 2021-06-24 DIAGNOSIS — E782 Mixed hyperlipidemia: Secondary | ICD-10-CM | POA: Diagnosis not present

## 2021-06-24 DIAGNOSIS — E039 Hypothyroidism, unspecified: Secondary | ICD-10-CM | POA: Diagnosis not present

## 2021-06-24 DIAGNOSIS — R7309 Other abnormal glucose: Secondary | ICD-10-CM | POA: Diagnosis not present

## 2021-06-24 DIAGNOSIS — N390 Urinary tract infection, site not specified: Secondary | ICD-10-CM | POA: Diagnosis not present

## 2021-06-24 DIAGNOSIS — R011 Cardiac murmur, unspecified: Secondary | ICD-10-CM | POA: Diagnosis not present

## 2021-06-24 DIAGNOSIS — F5101 Primary insomnia: Secondary | ICD-10-CM | POA: Diagnosis not present

## 2021-07-02 LAB — BASIC METABOLIC PANEL
BUN/Creatinine Ratio: 24 (ref 12–28)
BUN: 19 mg/dL (ref 8–27)
CO2: 25 mmol/L (ref 20–29)
Calcium: 10.1 mg/dL (ref 8.7–10.3)
Chloride: 101 mmol/L (ref 96–106)
Creatinine, Ser: 0.8 mg/dL (ref 0.57–1.00)
Glucose: 108 mg/dL — ABNORMAL HIGH (ref 65–99)
Potassium: 4.6 mmol/L (ref 3.5–5.2)
Sodium: 140 mmol/L (ref 134–144)
eGFR: 76 mL/min/{1.73_m2} (ref >59)

## 2021-07-02 LAB — VITAMIN D 1,25 DIHYDROXY
Vitamin D 1, 25 (OH)2 Total: 60 pg/mL
Vitamin D2 1, 25 (OH)2: <10 pg/mL
Vitamin D3 1, 25 (OH)2: 60 pg/mL

## 2021-07-02 LAB — HEMOGLOBIN A1C
Est. average glucose Bld gHb Est-mCnc: 123 mg/dL
Hgb A1c MFr Bld: 5.9 % — ABNORMAL HIGH (ref 4.8–5.6)

## 2021-07-20 DIAGNOSIS — G43101 Migraine with aura, not intractable, with status migrainosus: Secondary | ICD-10-CM | POA: Diagnosis not present

## 2021-07-20 DIAGNOSIS — Z961 Presence of intraocular lens: Secondary | ICD-10-CM | POA: Diagnosis not present

## 2021-07-20 DIAGNOSIS — H04123 Dry eye syndrome of bilateral lacrimal glands: Secondary | ICD-10-CM | POA: Diagnosis not present

## 2021-07-20 DIAGNOSIS — H18513 Endothelial corneal dystrophy, bilateral: Secondary | ICD-10-CM | POA: Diagnosis not present

## 2021-07-20 DIAGNOSIS — H1045 Other chronic allergic conjunctivitis: Secondary | ICD-10-CM | POA: Diagnosis not present

## 2021-07-20 DIAGNOSIS — D3132 Benign neoplasm of left choroid: Secondary | ICD-10-CM | POA: Diagnosis not present

## 2021-07-20 DIAGNOSIS — D3131 Benign neoplasm of right choroid: Secondary | ICD-10-CM | POA: Diagnosis not present

## 2021-08-09 ENCOUNTER — Telehealth: Payer: Self-pay | Admitting: Internal Medicine

## 2021-08-09 NOTE — Telephone Encounter (Signed)
Pt updated and verbalized understanding. Pt also questioned if she is ok to resume Zetia. Per Cyril Mourning (pharmacist), pt ok to resume Zetia as well.

## 2021-08-09 NOTE — Telephone Encounter (Signed)
Yes pt can resume her Praluent injections every 2 weeks.

## 2021-08-09 NOTE — Telephone Encounter (Signed)
Pt c/o medication issue:  1. Name of Medication: Alirocumab 150 MG/ML SOAJ  2. How are you currently taking this medication (dosage and times per day)? Patient last gave herself the shot 07/24/21.   3. Are you having a reaction (difficulty breathing--STAT)?   4. What is your medication issue? Patient recently got over [place]. She gave herself her last shot about 3 days before she got diagnosed with covid. She wanted to know if she could start doing this shot again or if she needs to wait.  If she is not available, please leave an answer on her answering machine

## 2021-09-02 DIAGNOSIS — I10 Essential (primary) hypertension: Secondary | ICD-10-CM | POA: Diagnosis not present

## 2021-09-02 DIAGNOSIS — E7800 Pure hypercholesterolemia, unspecified: Secondary | ICD-10-CM | POA: Diagnosis not present

## 2021-09-02 DIAGNOSIS — R7303 Prediabetes: Secondary | ICD-10-CM | POA: Diagnosis not present

## 2021-09-02 DIAGNOSIS — E039 Hypothyroidism, unspecified: Secondary | ICD-10-CM | POA: Diagnosis not present

## 2021-09-02 DIAGNOSIS — I2510 Atherosclerotic heart disease of native coronary artery without angina pectoris: Secondary | ICD-10-CM | POA: Diagnosis not present

## 2021-09-02 DIAGNOSIS — Z951 Presence of aortocoronary bypass graft: Secondary | ICD-10-CM | POA: Diagnosis not present

## 2021-09-09 DIAGNOSIS — Z808 Family history of malignant neoplasm of other organs or systems: Secondary | ICD-10-CM | POA: Diagnosis not present

## 2021-09-09 DIAGNOSIS — Z85828 Personal history of other malignant neoplasm of skin: Secondary | ICD-10-CM | POA: Diagnosis not present

## 2021-09-09 DIAGNOSIS — L578 Other skin changes due to chronic exposure to nonionizing radiation: Secondary | ICD-10-CM | POA: Diagnosis not present

## 2021-09-09 DIAGNOSIS — L821 Other seborrheic keratosis: Secondary | ICD-10-CM | POA: Diagnosis not present

## 2021-09-09 DIAGNOSIS — D2271 Melanocytic nevi of right lower limb, including hip: Secondary | ICD-10-CM | POA: Diagnosis not present

## 2021-09-09 DIAGNOSIS — D03112 Melanoma in situ of right lower eyelid, including canthus: Secondary | ICD-10-CM | POA: Diagnosis not present

## 2021-09-09 DIAGNOSIS — D485 Neoplasm of uncertain behavior of skin: Secondary | ICD-10-CM | POA: Diagnosis not present

## 2021-09-09 DIAGNOSIS — D03111 Melanoma in situ of right upper eyelid, including canthus: Secondary | ICD-10-CM | POA: Diagnosis not present

## 2021-09-09 DIAGNOSIS — D225 Melanocytic nevi of trunk: Secondary | ICD-10-CM | POA: Diagnosis not present

## 2021-09-21 ENCOUNTER — Telehealth (HOSPITAL_BASED_OUTPATIENT_CLINIC_OR_DEPARTMENT_OTHER): Payer: PPO | Admitting: Internal Medicine

## 2021-09-21 ENCOUNTER — Ambulatory Visit (HOSPITAL_BASED_OUTPATIENT_CLINIC_OR_DEPARTMENT_OTHER): Payer: PPO | Admitting: Internal Medicine

## 2021-09-21 DIAGNOSIS — E785 Hyperlipidemia, unspecified: Secondary | ICD-10-CM | POA: Diagnosis not present

## 2021-09-21 LAB — LIPID PANEL
Chol/HDL Ratio: 2.9 ratio (ref 0.0–4.4)
Cholesterol, Total: 132 mg/dL (ref 100–199)
HDL: 46 mg/dL (ref >39)
LDL Chol Calc (NIH): 54 mg/dL (ref 0–99)
Triglycerides: 195 mg/dL — ABNORMAL HIGH (ref 0–149)
VLDL Cholesterol Cal: 32 mg/dL (ref 5–40)

## 2021-09-23 ENCOUNTER — Telehealth (INDEPENDENT_AMBULATORY_CARE_PROVIDER_SITE_OTHER): Payer: PPO | Admitting: Internal Medicine

## 2021-09-23 ENCOUNTER — Encounter: Payer: Self-pay | Admitting: Internal Medicine

## 2021-09-23 VITALS — Ht 65.5 in | Wt 218.0 lb

## 2021-09-23 DIAGNOSIS — E785 Hyperlipidemia, unspecified: Secondary | ICD-10-CM

## 2021-09-23 DIAGNOSIS — Z952 Presence of prosthetic heart valve: Secondary | ICD-10-CM | POA: Diagnosis not present

## 2021-09-23 DIAGNOSIS — T466X5A Adverse effect of antihyperlipidemic and antiarteriosclerotic drugs, initial encounter: Secondary | ICD-10-CM

## 2021-09-23 DIAGNOSIS — M791 Myalgia, unspecified site: Secondary | ICD-10-CM

## 2021-09-23 DIAGNOSIS — T466X5D Adverse effect of antihyperlipidemic and antiarteriosclerotic drugs, subsequent encounter: Secondary | ICD-10-CM | POA: Diagnosis not present

## 2021-09-23 DIAGNOSIS — Z951 Presence of aortocoronary bypass graft: Secondary | ICD-10-CM

## 2021-09-23 NOTE — Patient Instructions (Signed)
Medication Instructions:  No Changes In Medications at this time.  *If you need a refill on your cardiac medications before your next appointment, please call your pharmacy*  Lab Work: LIPID PANEL IN 1 YEAR- RIGHT BEFORE NEXT VISIT WITH Dr. Debara Pickett  If you have labs (blood work) drawn today and your tests are completely normal, you will receive your results only by: [place] (if you have MyChart) OR A paper copy in the mail If you have any lab test that is abnormal or we need to change your treatment, we will call you to review the results.  Follow-Up: At [facility], you and your health needs are our priority.  As part of our continuing mission to provide you with exceptional heart care, we have created designated Provider Care Teams.  These Care Teams include your primary Cardiologist (physician) and Advanced Practice Providers (APPs -  Physician Assistants and Nurse Practitioners) who all work together to provide you with the care you need, when you need it.  Your next appointment:   1 year(s) LIPID  The format for your next appointment:   In Person or Virtual   Provider:   Raliegh Ip Mali Hilty, MD

## 2021-09-23 NOTE — Progress Notes (Signed)
Virtual Visit via video note   This visit type was conducted due to national recommendations for restrictions regarding the COVID-19 Pandemic (e.g. social distancing) in an effort to limit this patient's exposure and mitigate transmission in our community.  Due to her co-morbid illnesses, this patient is at least at moderate risk for complications without adequate follow up.  This format is felt to be most appropriate for this patient at this time.  The patient does have access to video technology.  All issues noted in this document were discussed and addressed.  A limited physical exam could be performed with this format.  Please refer to the patient's chart for her  consent to telehealth for [facility].   Date:  09/23/2021   ID:  [name] [name], DOB 10/21/1946, MRN [id number] The patient was identified using 2 identifiers.  Evaluation Performed:  Follow-Up Visit  Patient Location:  [address] [place] [address]  Provider location:   [address], [address] [place], [place] [address]  PCP:  Drosinis, Pamalee Leyden, PA-C  Cardiologist:  Quay Burow, MD Electrophysiologist:  None   Chief Complaint:  High cholesterol  History of Present Illness:    [name] [name] is a 75 y.o. female who presents via audio/video conferencing for a telehealth visit today.  [name] [name] returns today for follow-up of dyslipidemia.  Her cholesterol continues to be high.  In the past she had taken Repatha but reported she had side effects including worsening of her IBS.  A year ago however her cholesterol had come down to a total 165, triglycerides 223, HDL 40 and LDL 87.  Subsequently she stopped the medication and her cholesterol then went up to total 271, triglycerides 262 HDL 53 and LDL 169.  We discussed several options and I felt that she might want to try Praluent as an alternative, however ultimately she did try it and says she still had side effects.  She wanted to work on diet alone.   She continues on low-dose ezetimibe.  A repeat lipid however 2 days ago show virtually no change in her lipids.  Total cholesterol now 253, triglycerides 274, HDL 41 and LDL 161.  She is quite disappointed with this and felt that she would have a better response with just diet however it seems that much of this is genetic.  We discussed some other possible options including Leqvio which is quite costly and somewhat cumbersome however we might be able to get approval, or possibly Nexletol, however she does have a history of gout and this could certainly precipitate gout.  09/23/2021  [name] [name] returns today via video follow-up.  Overall she says she is doing well.  Fortunately she is tolerating Praluent much better than she did Repatha.  This is evidenced by marked improvement in her lipids.  Total cholesterol now 132 (down from 253), triglycerides 195 (down from 274), HDL is gone up 5 points and LDL cholesterol is now 54 (down from 161).  The patient does not have symptoms concerning for COVID-19 infection (fever, chills, cough, or new SHORTNESS OF BREATH).    Prior CV studies:   The following studies were reviewed today:  Chart reviewed, lab work  PMHx:  Past Medical History:  Diagnosis Date   Aortic stenosis, severe 08/2018   progressed from moderate by ECHO 01-03-17    Arthritis    Cancer ([place])    basal cell skin cancer   Carotid artery bruit    Cataract  bilateral repaired   Claustrophobia    Coronary artery disease    Diverticulosis    Family history of heart disease    GERD (gastroesophageal reflux disease)    Gout    Heart murmur    repaired   History of hiatal hernia    History of kidney stones    Hyperlipemia    Hypertension    Irritable bowel syndrome    Stroke [facility])    old- 25 yrs ago found on a test.   Thyroid disease    UTI (urinary tract infection) 03/09/14    Past Surgical History:  Procedure Laterality Date   AORTIC VALVE REPLACEMENT N/A 09/24/2018    Procedure: AORTIC VALVE REPLACEMENT (AVR) using  an INSPIRIS RESILIA  Prosthetic Valve (Model #: 11500A, Size: 23MM, Serial #: [id number]);  Surgeon: Melrose Nakayama, MD;  Location: [place];  Service: Open Heart Surgery;  Laterality: N/A;   APPENDECTOMY     taken out with hysterectomy   BLADDER SURGERY     Lift    CARDIAC CATHETERIZATION     COLONOSCOPY  2015   CORONARY ARTERY BYPASS GRAFT N/A 09/24/2018   Procedure: CORONARY ARTERY BYPASS GRAFTING (CABG) x 2; (LIMA to LAD, SVG to PDA) using RIGHT THIGH GREATER SAPHENOUS VEIN GRAFT (SVG) and LEFT INTERNAL MAMMARY ARTERY (LIMA): LIMA to LAD, SVG to PDA ;  Surgeon: Melrose Nakayama, MD;  Location: [place];  Service: Open Heart Surgery;  Laterality: N/A;   EYE SURGERY Bilateral    cataract surgery with lens implants   lithotripsy   within last 10 years   OOPHORECTOMY     PARTIAL HYSTERECTOMY     RIGHT/LEFT HEART CATH AND CORONARY ANGIOGRAPHY N/A 09/10/2018   Procedure: RIGHT/LEFT HEART CATH AND CORONARY ANGIOGRAPHY;  Surgeon: Lorretta Harp, MD;  Location: [place] CV [facility];  Service: Cardiovascular;  Laterality: N/A;   TEE WITHOUT CARDIOVERSION N/A 09/24/2018   Procedure: TRANSESOPHAGEAL ECHOCARDIOGRAM (TEE);  Surgeon: Melrose Nakayama, MD;  Location: [place];  Service: Open Heart Surgery;  Laterality: N/A;   TOTAL KNEE ARTHROPLASTY Right 07/24/2017   Procedure: RIGHT TOTAL KNEE ARTHROPLASTY;  Surgeon: Gaynelle Arabian, MD;  Location: [facility];  Service: Orthopedics;  Laterality: Right;  Adductor Block    FAMHx:  Family History  Problem Relation Age of Onset   Heart disease Mother    Heart disease Father    Colon cancer Neg Hx    Colon polyps Neg Hx    Esophageal cancer Neg Hx    Rectal cancer Neg Hx    Stomach cancer Neg Hx     SOCHx:   reports that she quit smoking about 34 years ago. Her smoking use included cigarettes. She has a 16.00 pack-year smoking history. She has never used smokeless tobacco. She reports that she does  not drink alcohol and does not use drugs.  ALLERGIES:  Allergies  Allergen Reactions   Repatha [Evolocumab]     Migraine, IBS flaire up, and hair loss   Icosapent Ethyl Other (See Comments)      patient states gives  "gas"   Rosuvastatin Other (See Comments)    Muscle aches   Amoxicillin Rash    Has patient had a PCN reaction causing immediate rash, facial/tongue/throat swelling, SOB or lightheadedness with hypotension: No Has patient had a PCN reaction causing severe rash involving mucus membranes or skin necrosis: No Has patient had a PCN reaction that required hospitalization: No Has patient had a PCN reaction occurring within  the last 10 years: No If all of the above answers are "NO", then may proceed with Cephalosporin use.     Amoxicillin-Pot Clavulanate Rash    Has patient had a PCN reaction causing immediate rash, facial/tongue/throat swelling, SOB or lightheadedness with hypotension: No Has patient had a PCN reaction causing severe rash involving mucus membranes or skin necrosis: No Has patient had a PCN reaction that required hospitalization: No Has patient had a PCN reaction occurring within the last 10 years: No If all of the above answers are "NO", then may proceed with Cephalosporin use.    Ampicillin Rash   Clarithromycin Rash   Clopidogrel Bisulfate Rash   Nitrofurantoin Itching   Plavix [Clopidogrel Bisulfate] Rash   Zestril [Lisinopril] Rash    MEDS:  Current Meds  Medication Sig   Alirocumab 150 MG/ML SOAJ Inject 1 Dose into the skin every 14 (fourteen) days.   allopurinol (ZYLOPRIM) 100 MG tablet Take 1 tablet (100 mg total) by mouth daily.   amLODipine (NORVASC) 5 MG tablet Take 5 mg by mouth daily.   aspirin 81 MG EC tablet Take 1 tablet (81 mg total) by mouth daily.   cephALEXin (KEFLEX) 500 MG capsule Take all 4 tablets one hour prior to dental procedure   Cholecalciferol (VITAMIN D) 2000 units tablet Take 1 tablet by mouth daily.   ezetimibe  (ZETIA) 10 MG tablet Take 1 tablet by mouth once daily   hydrochlorothiazide (MICROZIDE) 12.5 MG capsule Take 1 capsule by mouth once daily   levothyroxine (SYNTHROID) 100 MCG tablet Take 100 mcg by mouth daily before breakfast.   losartan (COZAAR) 50 MG tablet Take 1 tablet (50 mg total) by mouth daily.   meclizine (ANTIVERT) 25 MG tablet Take 25 mg by mouth 3 (three) times daily as needed for dizziness.    metoprolol tartrate (LOPRESSOR) 25 MG tablet Take 1 tablet by mouth twice daily   traMADol (ULTRAM) 50 MG tablet Take 50 mg by mouth every 4-6 hours PRN severe pain     ROS: Pertinent items noted in HPI and remainder of comprehensive ROS otherwise negative.  Labs/Other Tests and Data Reviewed:    Recent Labs: 06/17/2021: BUN 19; Creatinine, Ser 0.80; Potassium 4.6; Sodium 140   Recent Lipid Panel Lab Results  Component Value Date/Time   CHOL 132 09/21/2021 08:40 AM   TRIG 195 (H) 09/21/2021 08:40 AM   HDL 46 09/21/2021 08:40 AM   CHOLHDL 2.9 09/21/2021 08:40 AM   LDLCALC 54 09/21/2021 08:40 AM    Wt Readings from Last 3 Encounters:  09/23/21 218 lb (98.9 kg)  06/23/21 218 lb (98.9 kg)  03/10/21 227 lb (103 kg)     Exam:    Vital Signs:  Ht 5' 5.5" (1.664 m)   Wt 218 lb (98.9 kg)   BMI 35.73 kg/m    General appearance: alert and no distress Lungs: No visual respiratory difficulty Abdomen: Moderately obese Extremities: extremities normal, atraumatic, no cyanosis or edema Skin: Skin color, texture, turgor normal. No rashes or lesions Neurologic: Grossly normal Psych: Pleasant  ASSESSMENT & PLAN:    Mixed dyslipidemia, goal LDL less than 70 Relative statin intolerance-myalgias Repatha intolerant Coronary artery disease status post CABG x2 Aortic stenosis status post AVR IBS with recurrent symptoms  Ms. Gaul is tolerating Praluent much better than Repatha and has had marked improvement in her dyslipidemia with LDL cholesterol now at goal.  No medication changes  recommended at this time.  We will plan repeat lipids and follow-up  with me annually or sooner with necessary and with her PCP in the interim.  COVID-19 Education: The signs and symptoms of COVID-19 were discussed with the patient and how to seek care for testing (follow up with PCP or arrange E-visit).  The importance of social distancing was discussed today.  Patient Risk:   After full review of this patients clinical status, I feel that they are at least moderate risk at this time.  Time:   Today, I have spent 15 minutes with the patient with telehealth technology discussing dyslipidemia, PCSK9 inhibitors.     Medication Adjustments/Labs and Tests Ordered: Current medicines are reviewed at length with the patient today.  Concerns regarding medicines are outlined above.   Tests Ordered: Orders Placed This Encounter  Procedures   Lipid panel     Medication Changes: No orders of the defined types were placed in this encounter.   Disposition:  in 1 year(s)  Pixie Casino, MD, [facility], [place] Director of the Advanced Lipid Disorders &  Cardiovascular Risk Reduction Clinic Diplomate of the American Board of Clinical Lipidology Attending Cardiologist  Direct Dial: [phone]  Fax: [phone]  Website:  [email or web address]  Pixie Casino, MD  09/23/2021 9:07 PM

## 2021-10-04 DIAGNOSIS — D0339 Melanoma in situ of other parts of face: Secondary | ICD-10-CM | POA: Diagnosis not present

## 2021-10-04 DIAGNOSIS — D485 Neoplasm of uncertain behavior of skin: Secondary | ICD-10-CM | POA: Diagnosis not present

## 2021-10-04 DIAGNOSIS — L814 Other melanin hyperpigmentation: Secondary | ICD-10-CM | POA: Diagnosis not present

## 2021-10-11 DIAGNOSIS — H02132 Senile ectropion of right lower eyelid: Secondary | ICD-10-CM | POA: Diagnosis not present

## 2021-10-11 DIAGNOSIS — C439 Malignant melanoma of skin, unspecified: Secondary | ICD-10-CM | POA: Diagnosis not present

## 2021-10-11 DIAGNOSIS — S01411S Laceration without foreign body of right cheek and temporomandibular area, sequela: Secondary | ICD-10-CM | POA: Diagnosis not present

## 2021-10-11 DIAGNOSIS — S0181XS Laceration without foreign body of other part of head, sequela: Secondary | ICD-10-CM | POA: Diagnosis not present

## 2021-10-13 ENCOUNTER — Telehealth: Payer: Self-pay

## 2021-10-13 NOTE — Telephone Encounter (Signed)
[name] [name]. Mccullars 75 year old female is requesting preoperative cardiac evaluation for Mohs procedure.  She was last seen virtually by Dr. Debara Pickett on 09/23/2021.  She was tolerating Praluent well and her cholesterol has significantly improved.  She denied symptoms of COVID.   Her PMH includes coronary artery disease status post CABG x2 in 2019, dyslipidemia, AI status post AVR, and myalgias due to statin.  May her aspirin be held prior to her procedure?  Thank you for your help.  Please direct your response to CV DIV preop pool.  [name] [name]. German Manke NP-C    10/13/2021, 10:27 AM [place] [address] Office [phone] Fax [phone]

## 2021-10-13 NOTE — Telephone Encounter (Signed)
Left message to call back on 10/13/2021 at 1527.  Patient needs call back

## 2021-10-13 NOTE — Telephone Encounter (Signed)
   [facility] Pre-operative Risk Assessment    Patient Name: [name] [name]  DOB: [birth year] MRN: [id number]  HEARTCARE STAFF:  - IMPORTANT!!!!!! Under Visit Info/Reason for Call, type in Other and utilize the format Clearance MM/DD/YY or Clearance TBD. Do not use dashes or single digits. - Please review there is not already an duplicate clearance open for this procedure. - If request is for dental extraction, please clarify the # of teeth to be extracted. - If the patient is currently at the dentist's office, call Pre-Op Callback Staff (MA/nurse) to input urgent request.  - If the patient is not currently in the dentist office, please route to the Pre-Op pool.  Request for surgical clearance:  What type of surgery is being performed? Right Outer and Mis Orbit Melanoma Mohs Reconstruction   When is this surgery scheduled? 11/02/2021  What type of clearance is required (medical clearance vs. Pharmacy clearance to hold med vs. Both)? Medical   Are there any medications that need to be held prior to surgery and how long? Aspirin   Practice name and name of physician performing surgery? [facility] w/Dr. Isidoro Donning  What is the office phone number? [phone]   7.   What is the office fax number? [phone]  8.   Anesthesia type (None, local, MAC, general) ? MAC   [name] [name] [name] [name] 10/13/2021, 10:14 AM  _________________________________________________________________   (provider comments below)

## 2021-10-15 DIAGNOSIS — Z23 Encounter for immunization: Secondary | ICD-10-CM | POA: Diagnosis not present

## 2021-10-15 DIAGNOSIS — M159 Polyosteoarthritis, unspecified: Secondary | ICD-10-CM | POA: Diagnosis not present

## 2021-10-15 DIAGNOSIS — Z951 Presence of aortocoronary bypass graft: Secondary | ICD-10-CM | POA: Diagnosis not present

## 2021-10-15 DIAGNOSIS — M109 Gout, unspecified: Secondary | ICD-10-CM | POA: Diagnosis not present

## 2021-10-15 DIAGNOSIS — Z0000 Encounter for general adult medical examination without abnormal findings: Secondary | ICD-10-CM | POA: Diagnosis not present

## 2021-10-15 DIAGNOSIS — E782 Mixed hyperlipidemia: Secondary | ICD-10-CM | POA: Diagnosis not present

## 2021-10-15 DIAGNOSIS — E039 Hypothyroidism, unspecified: Secondary | ICD-10-CM | POA: Diagnosis not present

## 2021-10-15 DIAGNOSIS — R82998 Other abnormal findings in urine: Secondary | ICD-10-CM | POA: Diagnosis not present

## 2021-10-15 DIAGNOSIS — E559 Vitamin D deficiency, unspecified: Secondary | ICD-10-CM | POA: Diagnosis not present

## 2021-10-15 DIAGNOSIS — I10 Essential (primary) hypertension: Secondary | ICD-10-CM | POA: Diagnosis not present

## 2021-10-15 DIAGNOSIS — Z952 Presence of prosthetic heart valve: Secondary | ICD-10-CM | POA: Diagnosis not present

## 2021-10-15 DIAGNOSIS — R7303 Prediabetes: Secondary | ICD-10-CM | POA: Diagnosis not present

## 2021-10-15 NOTE — Telephone Encounter (Signed)
   Primary Cardiologist: Quay Burow, MD  Chart reviewed as part of pre-operative protocol coverage. Given past medical history and time since last visit, based on ACC/AHA guidelines, [name] [name] would be at acceptable risk for the planned procedure without further cardiovascular testing.   Her aspirin may be held for 5-7 days prior to her procedure.  Please resume as soon as hemostasis is achieved.  Patient was advised that if she develops new symptoms prior to surgery to contact our office to arrange a follow-up appointment.  She verbalized understanding.  I will route this recommendation to the requesting party via Epic fax function and remove from pre-op pool.  Please call with questions.  Jossie Ng. Kaylaann Mountz NP-C    10/15/2021, 4:51 PM [place] [address] Office [phone] Fax [phone]

## 2021-10-15 NOTE — Telephone Encounter (Signed)
Left message to call back on 10/15/2021 1308.  Patient needs call back

## 2021-11-01 DIAGNOSIS — L905 Scar conditions and fibrosis of skin: Secondary | ICD-10-CM | POA: Diagnosis not present

## 2021-11-01 DIAGNOSIS — L989 Disorder of the skin and subcutaneous tissue, unspecified: Secondary | ICD-10-CM | POA: Diagnosis not present

## 2021-11-01 DIAGNOSIS — D0339 Melanoma in situ of other parts of face: Secondary | ICD-10-CM | POA: Diagnosis not present

## 2021-11-02 DIAGNOSIS — H02122 Mechanical ectropion of right lower eyelid: Secondary | ICD-10-CM | POA: Diagnosis not present

## 2021-11-02 DIAGNOSIS — H11821 Conjunctivochalasis, right eye: Secondary | ICD-10-CM | POA: Diagnosis not present

## 2021-11-02 DIAGNOSIS — Z481 Encounter for planned postprocedural wound closure: Secondary | ICD-10-CM | POA: Diagnosis not present

## 2021-11-02 DIAGNOSIS — S01111S Laceration without foreign body of right eyelid and periocular area, sequela: Secondary | ICD-10-CM | POA: Diagnosis not present

## 2021-11-02 DIAGNOSIS — S0181XS Laceration without foreign body of other part of head, sequela: Secondary | ICD-10-CM | POA: Diagnosis not present

## 2021-11-02 DIAGNOSIS — Z483 Aftercare following surgery for neoplasm: Secondary | ICD-10-CM | POA: Diagnosis not present

## 2021-11-02 DIAGNOSIS — H04541 Stenosis of right lacrimal canaliculi: Secondary | ICD-10-CM | POA: Diagnosis not present

## 2021-11-02 DIAGNOSIS — S01411S Laceration without foreign body of right cheek and temporomandibular area, sequela: Secondary | ICD-10-CM | POA: Diagnosis not present

## 2021-11-02 DIAGNOSIS — H02132 Senile ectropion of right lower eyelid: Secondary | ICD-10-CM | POA: Diagnosis not present

## 2021-11-02 DIAGNOSIS — C441291 Squamous cell carcinoma of skin of left upper eyelid, including canthus: Secondary | ICD-10-CM | POA: Diagnosis not present

## 2021-12-22 ENCOUNTER — Telehealth: Payer: Self-pay | Admitting: Internal Medicine

## 2021-12-22 NOTE — Telephone Encounter (Signed)
°*  STAT* If patient is at the pharmacy, call can be transferred to refill team.   1. Which medications need to be refilled? (please list name of each medication and dose if known) Alirocumab 150 MG/ML SOAJ  2. Which pharmacy/location (including street and city if local pharmacy) is medication to be sent to? [facility] - [place], [place] - [address]  3. Do they need a 30 day or 90 day supply?

## 2021-12-23 MED ORDER — ALIROCUMAB 150 MG/ML ~~LOC~~ SOAJ: 1.0000 | SUBCUTANEOUS | 9 refills | Status: DC

## 2021-12-23 NOTE — Telephone Encounter (Signed)
Rx(s) sent to pharmacy electronically.  

## 2022-02-23 ENCOUNTER — Other Ambulatory Visit: Payer: Self-pay

## 2022-02-23 ENCOUNTER — Telehealth: Payer: Self-pay | Admitting: Internal Medicine

## 2022-02-23 ENCOUNTER — Ambulatory Visit (HOSPITAL_BASED_OUTPATIENT_CLINIC_OR_DEPARTMENT_OTHER)
Admission: RE | Admit: 2022-02-23 | Discharge: 2022-02-23 | Disposition: A | Discharge status: Home / Self Care | Payer: PPO | Source: Ambulatory Visit | Attending: Cardiovascular Disease | Admitting: Cardiovascular Disease

## 2022-02-23 DIAGNOSIS — I350 Nonrheumatic aortic (valve) stenosis: Secondary | ICD-10-CM

## 2022-02-23 DIAGNOSIS — Z951 Presence of aortocoronary bypass graft: Secondary | ICD-10-CM

## 2022-02-23 LAB — ECHOCARDIOGRAM COMPLETE
AR max vel: 1.72 cm2
AV Area VTI: 1.78 cm2
AV Area mean vel: 1.79 cm2
AV Mean grad: 8.7 mmHg
AV Peak grad: 14.4 mmHg
Ao pk vel: 1.9 m/s
Area-P 1/2: 2.41 cm2
S' Lateral: 2.5 cm

## 2022-02-23 NOTE — Telephone Encounter (Signed)
PA for praluent was faxed by Terrence Dupont CMA on 12/16/21 ?No response received until fax dated 02/22/22 came from [facility] that stated additional clinical info was needed but did not specify what was needed. Called insurance at [phone] (Rx Advance) and completed additional PA info on the phone  ? ?Response should be sent to office via fax within 72 hours ? ?Total time: 20 mins ?

## 2022-02-23 NOTE — Progress Notes (Signed)
?  Echocardiogram ?2D Echocardiogram has been performed. ? ?[name] [name] ?02/23/2022, 11:36 AM ?

## 2022-03-03 ENCOUNTER — Telehealth: Payer: Self-pay | Admitting: Cardiovascular Disease

## 2022-03-03 NOTE — Telephone Encounter (Signed)
Called patient- gave ECHO results.  ? ?Patient verbalized understanding.  ? ?Also scheduled 12 month follow up per patient request in May.  ? ?Patient thankful for call back.  ? ?

## 2022-03-03 NOTE — Telephone Encounter (Signed)
Patient called to receive her test results. Please call back ?

## 2022-03-29 ENCOUNTER — Telehealth: Payer: Self-pay | Admitting: *Deleted

## 2022-03-29 ENCOUNTER — Telehealth: Payer: Self-pay

## 2022-03-29 NOTE — Telephone Encounter (Signed)
? ?  Pre-operative Risk Assessment  ?  ?Patient Name: [name] [name]  ?DOB: 08-Jan-1946 ?MRN: [id number]  ? ?  ? ?Request for Surgical Clearance   ? ?Procedure:  right lower eyelid cicatricial ectropion repair with release of scar tissue and skin graft of the lower eyelid with midfacial support and amnioguard placement ? ?Date of Surgery:  Clearance 04/11/22                              ?   ?Surgeon:  Isidoro Donning ?Surgeon's Group or Practice Name:  [facility] ?Phone number:  [phone] ?Fax number:  [phone] ?  ?Type of Clearance Requested:   ?- Pharmacy:  Hold Aspirin how many days prior to procedure? ?  ?Type of Anesthesia:  Not Indicated ?  ?Additional requests/questions:   ? ?Signed, ?Kathreen Devoid   ?03/29/2022, 2:52 PM  ? ?

## 2022-03-29 NOTE — Telephone Encounter (Signed)
Pt has been scheduled for a telephone visit, 03/31/22 at 10:00.  Medications reconciled / consent on file. ?

## 2022-03-29 NOTE — Telephone Encounter (Signed)
Please arrange virtual telephone visit to help assist with cardiac clearance.  Patient previously had melanoma removal in November and had to hold aspirin for 5 to 7 days at the time. ?

## 2022-03-29 NOTE — Telephone Encounter (Signed)
Pt has been scheduled for a telephone visit 03/31/22. ? ?Medications reconciled / consent on file ? ?  ?Patient Consent for Virtual Visit  ? ? ?   ? ?[name] [name] has provided verbal consent on 03/29/2022 for a virtual visit (video or telephone). ? ? ?CONSENT FOR VIRTUAL VISIT FOR:  [name] [name]  ?By participating in this virtual visit I agree to the following: ? ?I hereby voluntarily request, consent and authorize [facility] and its employed or contracted physicians, physician assistants, nurse practitioners or other licensed health care professionals (the Practitioner), to provide me with telemedicine health care services (the ?Services") as deemed necessary by the treating Practitioner. I acknowledge and consent to receive the Services by the Practitioner via telemedicine. I understand that the telemedicine visit will involve communicating with the Practitioner through live audiovisual communication technology and the disclosure of certain medical information by electronic transmission. I acknowledge that I have been given the opportunity to request an in-person assessment or other available alternative prior to the telemedicine visit and am voluntarily participating in the telemedicine visit. ? ?I understand that I have the right to withhold or withdraw my consent to the use of telemedicine in the course of my care at any time, without affecting my right to future care or treatment, and that the Practitioner or I may terminate the telemedicine visit at any time. I understand that I have the right to inspect all information obtained and/or recorded in the course of the telemedicine visit and may receive copies of available information for a reasonable fee.  I understand that some of the potential risks of receiving the Services via telemedicine include:  ?Delay or interruption in medical evaluation due to technological equipment failure or disruption; ?Information transmitted may not be sufficient (e.g.  poor resolution of images) to allow for appropriate medical decision making by the Practitioner; and/or  ?In rare instances, security protocols could fail, causing a breach of personal health information. ? ?Furthermore, I acknowledge that it is my responsibility to provide information about my medical history, conditions and care that is complete and accurate to the best of my ability. I acknowledge that Practitioner's advice, recommendations, and/or decision may be based on factors not within their control, such as incomplete or inaccurate data provided by me or distortions of diagnostic images or specimens that may result from electronic transmissions. I understand that the practice of medicine is not an exact science and that Practitioner makes no warranties or guarantees regarding treatment outcomes. I acknowledge that a copy of this consent can be made available to me via my patient portal ([facility]), or I can request a printed copy by calling the office of [facility].   ? ?I understand that my insurance will be billed for this visit.  ? ?I have read or had this consent read to me. ?I understand the contents of this consent, which adequately explains the benefits and risks of the Services being provided via telemedicine.  ?I have been provided ample opportunity to ask questions regarding this consent and the Services and have had my questions answered to my satisfaction. ?I give my informed consent for the services to be provided through the use of telemedicine in my medical care ? ? ? ?

## 2022-03-29 NOTE — Telephone Encounter (Signed)
Called pt, went over her medications. "I was not focused when I spoke with her. I just wanted to make sure my medications were right." Her medications were up to date. She thanked me for calling her back.  ?

## 2022-03-29 NOTE — Telephone Encounter (Signed)
Patient calling back she was sure if she given al her medication. Please advise ?

## 2022-03-31 ENCOUNTER — Ambulatory Visit (INDEPENDENT_AMBULATORY_CARE_PROVIDER_SITE_OTHER): Payer: PPO | Admitting: Nurse Practitioner

## 2022-03-31 DIAGNOSIS — Z01810 Encounter for preprocedural cardiovascular examination: Secondary | ICD-10-CM

## 2022-03-31 NOTE — Progress Notes (Signed)
? ?Virtual Visit via Telephone Note  ? ?This visit type was conducted due to national recommendations for restrictions regarding the COVID-19 Pandemic (e.g. social distancing) in an effort to limit this patient's exposure and mitigate transmission in our community.  Due to her co-morbid illnesses, this patient is at least at moderate risk for complications without adequate follow up.  This format is felt to be most appropriate for this patient at this time.  The patient did not have access to video technology/had technical difficulties with video requiring transitioning to audio format only (telephone).  All issues noted in this document were discussed and addressed.  No physical exam could be performed with this format.  Please refer to the patient's chart for her  consent to telehealth for [facility]. ? ?Evaluation Performed:  Preoperative cardiovascular risk assessment ?_____________  ? ?Date:  03/31/2022  ? ?Patient ID:  [name] [name], [name] [name] June 28, 1946, MRN [id number] ?Patient Location:  ?Home ?Provider location:   ?Office ? ?Primary Care Provider:  Drosinis, Pamalee Leyden, PA-C ?Primary Cardiologist:  Quay Burow, MD ? ?Chief Complaint  ?  ?76 y.o. y/o female with a h/o CAD s/p CABG x2 in 2019, aortic stenosis s/p AVR, hypertension, hyperlipidemia intolerant to statins/Repatha intolerant, and IBS who is pending right lower eyelid cicatricial ectropion repair release of scar tissue and skin graft of the lower eyelid with mid facial support and amnio guard placement, and presents today for telephonic preoperative cardiovascular risk assessment. ? ?Past Medical History  ?  ?Past Medical History:  ?Diagnosis Date  ? Aortic stenosis, severe 08/2018  ? progressed from moderate by ECHO 01-03-17   ? Arthritis   ? Cancer [facility])   ? basal cell skin cancer  ? Carotid artery bruit   ? Cataract   ? bilateral repaired  ? Claustrophobia   ? Coronary artery disease   ? Diverticulosis   ? Family history of heart disease   ? GERD  (gastroesophageal reflux disease)   ? Gout   ? Heart murmur   ? repaired  ? History of hiatal hernia   ? History of kidney stones   ? Hyperlipemia   ? Hypertension   ? Irritable bowel syndrome   ? Stroke [facility])   ? old- 25 yrs ago found on a test.  ? Thyroid disease   ? UTI (urinary tract infection) 03/09/14  ? ?Past Surgical History:  ?Procedure Laterality Date  ? AORTIC VALVE REPLACEMENT N/A 09/24/2018  ? Procedure: AORTIC VALVE REPLACEMENT (AVR) using  an INSPIRIS RESILIA  Prosthetic Valve (Model #: 11500A, Size: 23MM, Serial #: [id number]);  Surgeon: Melrose Nakayama, MD;  Location: [place];  Service: Open Heart Surgery;  Laterality: N/A;  ? APPENDECTOMY    ? taken out with hysterectomy  ? BLADDER SURGERY    ? Lift   ? CARDIAC CATHETERIZATION    ? COLONOSCOPY  2015  ? CORONARY ARTERY BYPASS GRAFT N/A 09/24/2018  ? Procedure: CORONARY ARTERY BYPASS GRAFTING (CABG) x 2; (LIMA to LAD, SVG to PDA) using RIGHT THIGH GREATER SAPHENOUS VEIN GRAFT (SVG) and LEFT INTERNAL MAMMARY ARTERY (LIMA): LIMA to LAD, SVG to PDA ;  Surgeon: Melrose Nakayama, MD;  Location: [place];  Service: Open Heart Surgery;  Laterality: N/A;  ? EYE SURGERY Bilateral   ? cataract surgery with lens implants  ? lithotripsy   within last 10 years  ? OOPHORECTOMY    ? PARTIAL HYSTERECTOMY    ? RIGHT/LEFT HEART CATH AND CORONARY ANGIOGRAPHY N/A 09/10/2018  ?  Procedure: RIGHT/LEFT HEART CATH AND CORONARY ANGIOGRAPHY;  Surgeon: Lorretta Harp, MD;  Location: [place] CV LAB;  Service: Cardiovascular;  Laterality: N/A;  ? TEE WITHOUT CARDIOVERSION N/A 09/24/2018  ? Procedure: TRANSESOPHAGEAL ECHOCARDIOGRAM (TEE);  Surgeon: Melrose Nakayama, MD;  Location: [place];  Service: Open Heart Surgery;  Laterality: N/A;  ? TOTAL KNEE ARTHROPLASTY Right 07/24/2017  ? Procedure: RIGHT TOTAL KNEE ARTHROPLASTY;  Surgeon: Gaynelle Arabian, MD;  Location: [facility];  Service: Orthopedics;  Laterality: Right;  Adductor Block  ? ? ?Allergies ? ?Allergies  ?Allergen  Reactions  ? Repatha [Evolocumab]   ?  Migraine, IBS flaire up, and hair loss  ? Icosapent Ethyl Other (See Comments)  ?    patient states gives  "gas"  ? Rosuvastatin Other (See Comments)  ?  Muscle aches  ? Amoxicillin Rash  ?  Has patient had a PCN reaction causing immediate rash, facial/tongue/throat swelling, SOB or lightheadedness with hypotension: No ?Has patient had a PCN reaction causing severe rash involving mucus membranes or skin necrosis: No ?Has patient had a PCN reaction that required hospitalization: No ?Has patient had a PCN reaction occurring within the last 10 years: No ?If all of the above answers are "NO", then may proceed with Cephalosporin use. ? ?  ? Amoxicillin-Pot Clavulanate Rash  ?  Has patient had a PCN reaction causing immediate rash, facial/tongue/throat swelling, SOB or lightheadedness with hypotension: No ?Has patient had a PCN reaction causing severe rash involving mucus membranes or skin necrosis: No ?Has patient had a PCN reaction that required hospitalization: No ?Has patient had a PCN reaction occurring within the last 10 years: No ?If all of the above answers are "NO", then may proceed with Cephalosporin use. ?  ? Ampicillin Rash  ? Clarithromycin Rash  ? Clopidogrel Bisulfate Rash  ? Nitrofurantoin Itching  ? Plavix [Clopidogrel Bisulfate] Rash  ? Zestril [Lisinopril] Rash  ? ? ?History of Present Illness  ?  ?[name] [name] is a 76 y.o. female who presents via audio/video conferencing for a telehealth visit today.  Pt was last seen in cardiology clinic on 09/23/2021 by Dr. Debara Pickett.  At that time [name] [name] was doing well standpoint. The patient is now pending cicatricial ectropion repair release of scar tissue and skin graft of the lower eyelid with mid facial support and amnio guard placement scheduled for 04/11/2022 with Dr. Isidoro Donning with [facility]. Since her last visit, she has done well from a cardiac standpoint. She just returned from a cruise and states she  did a large amount of walking, which she tolerated well. She does have baseline mild dyspnea on exertion, however, this is stable and unchanged. Overall, she reports feeling well and denies any new or concerning symptoms. ? ?She denies chest pain, palpitations, worsening dyspnea, pnd, orthopnea, n, v, dizziness, syncope, edema, weight gain, or early satiety. All other systems reviewed and are otherwise negative except as noted above.  ? ?Home Medications  ?  ?Prior to Admission medications   ?Medication Sig Start Date End Date Taking? Authorizing Provider  ?Alirocumab 150 MG/ML SOAJ Inject 1 Dose into the skin every 14 (fourteen) days. 12/23/21   Pixie Casino, MD  ?allopurinol (ZYLOPRIM) 100 MG tablet Take 1 tablet (100 mg total) by mouth daily. 10/11/18   Barrett, Evelene Croon, PA-C  ?amLODipine (NORVASC) 5 MG tablet Take 5 mg by mouth daily.    [provider]  ?aspirin 81 MG EC tablet Take 1 tablet (81  mg total) by mouth daily. 01/15/19   Lorretta Harp, MD  ?cephALEXin [facility]) 500 MG capsule Take all 4 tablets one hour prior to dental procedure 12/10/18   Lorretta Harp, MD  ?Cholecalciferol (VITAMIN D) 2000 units tablet Take 1 tablet by mouth daily.    [provider]  ?ezetimibe (ZETIA) 10 MG tablet Take 1 tablet by mouth once daily 04/21/21   Lorretta Harp, MD  ?hydrochlorothiazide (MICROZIDE) 12.5 MG capsule Take 1 capsule by mouth once daily 03/29/21   Lorretta Harp, MD  ?levothyroxine (SYNTHROID) 100 MCG tablet Take 100 mcg by mouth daily before breakfast.    [provider]  ?losartan (COZAAR) 50 MG tablet Take 1 tablet (50 mg total) by mouth daily. 04/09/19   Lorretta Harp, MD  ?meclizine (ANTIVERT) 25 MG tablet Take 25 mg by mouth 3 (three) times daily as needed for dizziness.     [provider]  ?metoprolol tartrate (LOPRESSOR) 25 MG tablet Take 1 tablet by mouth twice daily 03/29/21   Lorretta Harp, MD  ?traMADol Veatrice Bourbon) 50 MG tablet Take 50 mg by  mouth every 4-6 hours PRN severe pain 09/30/18   Nani Skillern, PA-C  ? ? ?Physical Exam  ?  ?Vital Signs:  [name] does not have vital signs available for review today. ? ?Given telephonic n

## 2022-04-09 ENCOUNTER — Other Ambulatory Visit: Payer: Self-pay | Admitting: Cardiovascular Disease

## 2022-05-11 ENCOUNTER — Ambulatory Visit: Payer: PPO | Admitting: Cardiovascular Disease

## 2022-05-11 ENCOUNTER — Encounter: Payer: Self-pay | Admitting: Cardiovascular Disease

## 2022-05-11 DIAGNOSIS — I2510 Atherosclerotic heart disease of native coronary artery without angina pectoris: Secondary | ICD-10-CM

## 2022-05-11 DIAGNOSIS — I10 Essential (primary) hypertension: Secondary | ICD-10-CM

## 2022-05-11 DIAGNOSIS — Z953 Presence of xenogenic heart valve: Secondary | ICD-10-CM | POA: Diagnosis not present

## 2022-05-11 DIAGNOSIS — I7121 Aneurysm of the ascending aorta, without rupture: Secondary | ICD-10-CM

## 2022-05-11 DIAGNOSIS — E7800 Pure hypercholesterolemia, unspecified: Secondary | ICD-10-CM

## 2022-05-11 MED ORDER — AZITHROMYCIN 500 MG PO TABS: 500.0000 mg | ORAL_TABLET | Freq: Once | ORAL | 4 refills | Status: AC

## 2022-05-11 NOTE — Assessment & Plan Note (Signed)
History of hyperlipidemia on Repatha and Zetia with lipid profile performed 09/21/2021 revealing total cholesterol 132, LDL 54 and HDL 46. ?

## 2022-05-11 NOTE — Patient Instructions (Addendum)
Medication Instructions:  ? ?-Azithromyin '500mg'$  1 hour prior to dental procedures. ? ?*If you need a refill on your cardiac medications before your next appointment, please call your pharmacy* ? ? ?Testing/Procedures: ?Your physician has requested that you have an echocardiogram. Echocardiography is a painless test that uses sound waves to create images of your heart. It provides your doctor with information about the size and shape of your heart and how well your heart?s chambers and valves are working. This procedure takes approximately one hour. There are no restrictions for this procedure. To be done in March 2024. This procedure will be done at [address] ? ? ?Follow-Up: ?At [facility], you and your health needs are our priority.  As part of our continuing mission to provide you with exceptional heart care, we have created designated Provider Care Teams.  These Care Teams include your primary Cardiologist (physician) and Advanced Practice Providers (APPs -  Physician Assistants and Nurse Practitioners) who all work together to provide you with the care you need, when you need it. ? ?We recommend signing up for the patient portal called "MyChart".  Sign up information is provided on this After Visit Summary.  MyChart is used to connect with patients for Virtual Visits (Telemedicine).  Patients are able to view lab/test results, encounter notes, upcoming appointments, etc.  Non-urgent messages can be sent to your provider as well.   ?To learn more about what you can do with MyChart, go to [email or web address].   ? ?Your next appointment:   ?12 month(s) ? ?The format for your next appointment:   ?In Person ? ?Provider:   ?Quay Burow, MD  ?

## 2022-05-11 NOTE — Progress Notes (Signed)
? ? ? ?05/11/2022 ?[name] [name]   ?06-13-1946  ?[id number] ? ?Primary Physician [name] Shirts, PA ?Primary Cardiologist: [name] Harp MD [name] [name], [name] [name], [place] ? ?HPI:  [name] [name] is a 76 y.o.  mildly overweight married Caucasian female mother of 3 children, grandmother to 6 grandchildren who I last saw in the office 03/10/2021..  Is accompanied by her daughter [name] [name] today.  She was self-referred to be established in our practice, a friend of hers is my patient. She is retired from [employer] in [place] and moved to [place] 17 years ago here at her primary care physician is Dr. Clyda Hurdle. Her cardiovascular risk factor profile is notable for family history of heart disease with both parents died of myocardial infarctions in their 54s and 4s and a brother who died of an MI at 47. She has never had a heart attack or a clinical stroke. Risk factors otherwise remarkable for treated hypertension and hyperlipidemia. She does have GERD.  She had negative nuclear stress test and 2-D echocardiogram at [facility] 3 years ago. She does have a history of a murmur and had a recent 2-D echo performed 01/30/18 revealing progression of her aortic stenosis now with a peak gradient of 70 and a valve area 1.17 cm?. She does complain of some dyspnea on exertion. She denies chest pain. ?Because of symptoms of dyspnea as well as a 2D echo that revealed severe lAS.I performed outpatient cardiac cath on her 09/10/2018 revealing a total mid RCA with high-grade proximal LAD disease and severe aortic stenosis.  She ultimately underwent CABG x2 with a LIMA to her LAD and a vein to the PDA as well as pericardial tissue aortic valve replacement by Dr. Roxan Hockey 09/24/2018 and was discharged a week later.  She is recuperating nicely.  Her symptoms preoperative dyspnea have resolved. ?  ?She had a 2D echo performed 01/21/2019 that revealed normal LV systolic function with a  well-functioning aortic bioprosthesis.  The thoracic aorta measured 4.7 cm on that study and therefore follow-up CT of her chest was done to confirm this which only showed the aortic dimension of 41 mm.  She still still complains of some shortness of breath but markedly improved since her aortic valve replacement.  She recently returned from a 9-day trip to [place] which was therapeutic for her. ?  ?Since I saw her a year ago she continues to do well.  She denies chest pain or shortness of breath.  She is seeing Dr. Debara Pickett in the lipid clinic unfortunately is intolerant to statin therapy as well as Repatha.  She is scheduled to start Praluent in the next several weeks.  2D echo performed 02/23/2022 revealed normal LV systolic function with a well functioning aortic bioprosthesis and chest CTA performed 02/17/2021 revealed slight decrease in her thoracic aortic measurements at 38 mm.  She recently [place] and admits to having indulged herself. ?  ? ? ?Current Meds  ?Medication Sig  ? Alirocumab 150 MG/ML SOAJ Inject 1 Dose into the skin every 14 (fourteen) days.  ? allopurinol (ZYLOPRIM) 100 MG tablet Take 1 tablet (100 mg total) by mouth daily.  ? amLODipine (NORVASC) 5 MG tablet Take 5 mg by mouth daily.  ? aspirin 81 MG EC tablet Take 1 tablet (81 mg total) by mouth daily.  ? cephALEXin (KEFLEX) 500 MG capsule Take all 4 tablets one hour prior to dental procedure  ?  Cholecalciferol (VITAMIN D) 2000 units tablet Take 1 tablet by mouth daily.  ? ezetimibe (ZETIA) 10 MG tablet Take 1 tablet by mouth once daily  ? hydrochlorothiazide (MICROZIDE) 12.5 MG capsule Take 1 capsule by mouth once daily  ? levothyroxine (SYNTHROID) 100 MCG tablet Take 100 mcg by mouth daily before breakfast.  ? losartan (COZAAR) 50 MG tablet Take 1 tablet (50 mg total) by mouth daily.  ? metoprolol tartrate (LOPRESSOR) 25 MG tablet Take 1 tablet by mouth twice daily  ? traMADol (ULTRAM) 50 MG tablet Take  50 mg by mouth every 4-6 hours PRN severe pain  ?  ? ?Allergies  ?Allergen Reactions  ? Repatha [Evolocumab]   ?  Migraine, IBS flaire up, and hair loss  ? Icosapent Ethyl Other (See Comments)  ?    patient states gives  "gas"  ? Rosuvastatin Other (See Comments)  ?  Muscle aches  ? Amoxicillin Rash  ?  Has patient had a PCN reaction causing immediate rash, facial/tongue/throat swelling, SOB or lightheadedness with hypotension: No ?Has patient had a PCN reaction causing severe rash involving mucus membranes or skin necrosis: No ?Has patient had a PCN reaction that required hospitalization: No ?Has patient had a PCN reaction occurring within the last 10 years: No ?If all of the above answers are "NO", then may proceed with Cephalosporin use. ? ?  ? Amoxicillin-Pot Clavulanate Rash  ?  Has patient had a PCN reaction causing immediate rash, facial/tongue/throat swelling, SOB or lightheadedness with hypotension: No ?Has patient had a PCN reaction causing severe rash involving mucus membranes or skin necrosis: No ?Has patient had a PCN reaction that required hospitalization: No ?Has patient had a PCN reaction occurring within the last 10 years: No ?If all of the above answers are "NO", then may proceed with Cephalosporin use. ?  ? Ampicillin Rash  ? Clarithromycin Rash  ? Clopidogrel Bisulfate Rash  ? Nitrofurantoin Itching  ? Plavix [Clopidogrel Bisulfate] Rash  ? Zestril [Lisinopril] Rash  ? ? ?Social History  ? ?Socioeconomic History  ? Marital status: Widowed  ?  Spouse name: Not on file  ? Number of children: Not on file  ? Years of education: Not on file  ? Highest education level: Not on file  ?Occupational History  ? Occupation: Retired  ?Tobacco Use  ? Smoking status: Former  ?  Packs/day: 0.80  ?  Years: 20.00  ?  Pack years: 16.00  ?  Types: Cigarettes  ?  Quit date: 09/21/1987  ?  Years since quitting: 34.6  ? Smokeless tobacco: Never  ? Tobacco comments:  ?  socially  ?Vaping Use  ? Vaping Use: Never used   ?Substance and Sexual Activity  ? Alcohol use: No  ? Drug use: No  ? Sexual activity: Not on file  ?Other Topics Concern  ? Not on file  ?Social History Narrative  ? Not on file  ? ?Social Determinants of Health  ? ?Financial Resource Strain: Not on file  ?Food Insecurity: Not on file  ?Transportation Needs: Not on file  ?Physical Activity: Not on file  ?Stress: Not on file  ?Social Connections: Not on file  ?Intimate Partner Violence: Not on file  ?  ? ?Review of Systems: ?General: negative for chills, fever, night sweats or weight changes.  ?Cardiovascular: negative for chest pain, dyspnea on exertion, edema, orthopnea, palpitations, paroxysmal nocturnal dyspnea or shortness of breath ?Dermatological: negative for rash ?Respiratory: negative for cough or wheezing ?Urologic: negative for hematuria ?  Abdominal: negative for nausea, vomiting, diarrhea, bright red blood per rectum, melena, or hematemesis ?Neurologic: negative for visual changes, syncope, or dizziness ?All other systems reviewed and are otherwise negative except as noted above. ? ? ? ?Blood pressure (!) 144/80, pulse 69, height '5\' 5"'$  (1.651 m), weight 226 lb 6.4 oz (102.7 kg), SpO2 90 %.  ?General appearance: alert and no distress ?Neck: no adenopathy, no carotid bruit, no JVD, supple, symmetrical, trachea midline, and thyroid not enlarged, symmetric, no tenderness/mass/nodules ?Lungs: clear to auscultation bilaterally ?Heart: regular rate and rhythm, S1, S2 normal, no murmur, click, rub or gallop ?Extremities: extremities normal, atraumatic, no cyanosis or edema ?Pulses: 2+ and symmetric ?Skin: Skin color, texture, turgor normal. No rashes or lesions ?Neurologic: Grossly normal ? ?EKG sinus rhythm at 69 with septal Q waves.  I personally reviewed this EKG. ? ?ASSESSMENT AND PLAN:  ? ?HYPERCHOLESTEROLEMIA ?History of hyperlipidemia on Repatha and Zetia with lipid profile performed 09/21/2021 revealing total cholesterol 132, LDL 54 and HDL  46. ? ?Essential hypertension ?History of essential hypertension a blood pressure measured today at 144/80.  She is on amlodipine, hydrochlorothiazide, losartan and metoprolol. ? ?S/P aortic valve replacement with bioprosthet

## 2022-05-11 NOTE — Assessment & Plan Note (Signed)
History of essential hypertension a blood pressure measured today at 144/80.  She is on amlodipine, hydrochlorothiazide, losartan and metoprolol. ?

## 2022-05-11 NOTE — Assessment & Plan Note (Signed)
History of CAD status post CABG x2 by Dr. Roxan Hockey 09/24/2018 with a LIMA to LAD and a vein to the PDA.  She denies chest pain.  This was done at the time of AVR. ?

## 2022-05-11 NOTE — Assessment & Plan Note (Signed)
History of small thoracic aortic aneurysm not seen on recent 2D echocardiogram. ?

## 2022-05-11 NOTE — Assessment & Plan Note (Signed)
History of severe aortic stenosis post aortic valve replacement by Dr. Roxan Hockey 09/24/2018 with a pericardial tissue valve.  Her most recent 2D echo performed 02/23/2022 revealed normal LV systolic function with grade 1 diastolic dysfunction and a well-functioning aortic bioprosthesis (Inspiris Resilia).  We will repeat this on annual basis. ?

## 2022-06-08 ENCOUNTER — Encounter: Payer: Self-pay | Admitting: *Deleted

## 2022-06-08 DIAGNOSIS — Z006 Encounter for examination for normal comparison and control in clinical research program: Secondary | ICD-10-CM

## 2022-06-08 NOTE — Research (Signed)
Message left For [name] [name] about [employer]. Encouraged her to call.

## 2022-08-11 ENCOUNTER — Telehealth: Payer: Self-pay | Admitting: Cardiovascular Disease

## 2022-08-11 MED ORDER — AZITHROMYCIN 500 MG PO TABS: 500.0000 mg | ORAL_TABLET | Freq: Every day | ORAL | 4 refills | Status: DC

## 2022-08-11 NOTE — Telephone Encounter (Signed)
*  STAT* If patient is at the pharmacy, call can be transferred to refill team.   1. Which medications need to be refilled? (please list name of each medication and dose if known) Azithromycin - '500mg'$  1 hour prior to dental procedures.  2. Which pharmacy/location (including street and city if local pharmacy) is medication to be sent to? [facility] - [place], [place] - [address]  3. Do they need a 30 day or 90 day supply? Pt requesting a refill on this medication before she sees the dentist next week. Did not see it on her medication list, but it was mentioned at her last visit 05/11/22 with Dr. Gwenlyn Found

## 2022-08-11 NOTE — Telephone Encounter (Signed)
Called pt to let her know that prescription has been sent in with refills. Pt verbalizes understanding.

## 2022-08-31 ENCOUNTER — Telehealth: Payer: Self-pay | Admitting: Cardiovascular Disease

## 2022-08-31 NOTE — Telephone Encounter (Signed)
Patient called back inform her was waiting on fax.Marland KitchenMarland Kitchen

## 2022-08-31 NOTE — Telephone Encounter (Signed)
Calling to f/u on Health and Physical form that was sent via fax yesterday. Office would like a callback to confirm. Please advise

## 2022-08-31 NOTE — Telephone Encounter (Signed)
Spoke with patient's dentist office. Patient is having upcoming dental work in 2 weeks requiring sedation. Dentist office will re-fax   There is nothing in chart related to this paperwork at present time

## 2022-09-01 ENCOUNTER — Telehealth: Payer: Self-pay | Admitting: *Deleted

## 2022-09-01 NOTE — Telephone Encounter (Signed)
Pt agreeable to plan of care for tele pre op appt 09/06/22 @ 11 am. Urgent add on due to ASA hold time for procedure.   Med rec and  consent are done.     Patient Consent for Virtual Visit        [name] [name] has provided verbal consent on 09/01/2022 for a virtual visit (video or telephone).   CONSENT FOR VIRTUAL VISIT FOR:  [name] [name]  By participating in this virtual visit I agree to the following:  I hereby voluntarily request, consent and authorize [facility] and its employed or contracted physicians, physician assistants, nurse practitioners or other licensed health care professionals (the Practitioner), to provide me with telemedicine health care services (the "Services") as deemed necessary by the treating Practitioner. I acknowledge and consent to receive the Services by the Practitioner via telemedicine. I understand that the telemedicine visit will involve communicating with the Practitioner through live audiovisual communication technology and the disclosure of certain medical information by electronic transmission. I acknowledge that I have been given the opportunity to request an in-person assessment or other available alternative prior to the telemedicine visit and am voluntarily participating in the telemedicine visit.  I understand that I have the right to withhold or withdraw my consent to the use of telemedicine in the course of my care at any time, without affecting my right to future care or treatment, and that the Practitioner or I may terminate the telemedicine visit at any time. I understand that I have the right to inspect all information obtained and/or recorded in the course of the telemedicine visit and may receive copies of available information for a reasonable fee.  I understand that some of the potential risks of receiving the Services via telemedicine include:  Delay or interruption in medical evaluation due to technological equipment failure or  disruption; Information transmitted may not be sufficient (e.g. poor resolution of images) to allow for appropriate medical decision making by the Practitioner; and/or  In rare instances, security protocols could fail, causing a breach of personal health information.  Furthermore, I acknowledge that it is my responsibility to provide information about my medical history, conditions and care that is complete and accurate to the best of my ability. I acknowledge that Practitioner's advice, recommendations, and/or decision may be based on factors not within their control, such as incomplete or inaccurate data provided by me or distortions of diagnostic images or specimens that may result from electronic transmissions. I understand that the practice of medicine is not an exact science and that Practitioner makes no warranties or guarantees regarding treatment outcomes. I acknowledge that a copy of this consent can be made available to me via my patient portal ([facility]), or I can request a printed copy by calling the office of [facility].    I understand that my insurance will be billed for this visit.   I have read or had this consent read to me. I understand the contents of this consent, which adequately explains the benefits and risks of the Services being provided via telemedicine.  I have been provided ample opportunity to ask questions regarding this consent and the Services and have had my questions answered to my satisfaction. I give my informed consent for the services to be provided through the use of telemedicine in my medical care

## 2022-09-01 NOTE — Telephone Encounter (Signed)
Still not entered in chart

## 2022-09-01 NOTE — Telephone Encounter (Signed)
Primary Cardiologist:Jonathan Gwenlyn Found, MD   Preoperative team, please contact this patient and set up a phone call appointment for further preoperative risk assessment. Please obtain consent and complete medication review. Thank you for your help.   It is reasonable to hold aspirin for 7 days prior to procedure as long as patient is not having symptoms of ACS and has had no recent coronary intervention.    Emmaline Life, NP-C     09/01/2022, 2:59 PM [address], [address] Office [phone] Fax [phone]

## 2022-09-01 NOTE — Telephone Encounter (Signed)
Patient following up, again wants to confirm dental clearance request has been received. Advised her clearance still not entered to chart. Please contact patient with any updates.

## 2022-09-01 NOTE — Telephone Encounter (Signed)
   Pre-operative Risk Assessment    Patient Name: [name] [name]  DOB: 1946/06/13 MRN: [id number]     Request for Surgical Clearance    Procedure:  Dental Extraction - Amount of Teeth to be Pulled:  2 teeth  and bone graft  Date of Surgery:  Clearance 09/14/22                                 Surgeon:  Dr Cicero Duck Surgeon's Group or Practice Name: [facility]  Phone number:  [phone] Fax number:  [phone]   Type of Clearance Requested:   - Pharmacy:  Hold Aspirin unknown   Type of Anesthesia:  deep sedation    Additional requests/questions:  Please advise surgeon/provider what medications should be held.  Olin Pia   09/01/2022, 2:15 PM

## 2022-09-01 NOTE — Telephone Encounter (Signed)
Pt is calling back stating she is urgently waiting for response due to needing this dental work soon. Requesting call back.

## 2022-09-01 NOTE — Telephone Encounter (Signed)
Pt agreeable to plan of care for tele pre op appt 09/06/22 @ 11 am. Urgent add on due to ASA hold time for procedure.    Med rec and  consent are done.

## 2022-09-01 NOTE — Telephone Encounter (Signed)
Called and spoke to patient. Informed her of the the process of pre - dental clearance.  Also  RN called  Dentists office get information started for a telephone pre-op  call.  Patient verbalized understanding.

## 2022-09-05 NOTE — Progress Notes (Unsigned)
Virtual Visit via Telephone Note   Because of [name] [name]'s co-morbid illnesses, she is at least at moderate risk for complications without adequate follow up.  This format is felt to be most appropriate for this patient at this time.  The patient did not have access to video technology/had technical difficulties with video requiring transitioning to audio format only (telephone).  All issues noted in this document were discussed and addressed.  No physical exam could be performed with this format.  Please refer to the patient's chart for her consent to telehealth for [facility].  Evaluation Performed:  Preoperative cardiovascular risk assessment _____________   Date:  09/05/2022   Patient ID:  [name] [name], DOB [birth year], MRN [id number] Patient Location:  Home Provider location:   Office  Primary Care Provider:  Manfred Shirts, PA Primary Cardiologist:  [name] Burow, MD  Chief Complaint / Patient Profile   76 y.o. y/o female with a h/o CAD s/p CABG x2 and pericardial tissue aortic valve replacement for aortic stenosis 08/2018 hypertension, hyperlipidemia, mild dilatation thoracic aorta, and family history early CAD who is pending dental extraction 2 teeth and bone graft and presents today for telephonic preoperative cardiovascular risk assessment.  Past Medical History    Past Medical History:  Diagnosis Date   Aortic stenosis, severe 08/2018   progressed from moderate by ECHO 01-03-17    Arthritis    Cancer ([place])    basal cell skin cancer   Carotid artery bruit    Cataract    bilateral repaired   Claustrophobia    Coronary artery disease    Diverticulosis    Family history of heart disease    GERD (gastroesophageal reflux disease)    Gout    Heart murmur    repaired   History of hiatal hernia    History of kidney stones    Hyperlipemia    Hypertension    Irritable bowel syndrome    Stroke [facility])    old- 25 yrs ago found on a test.   Thyroid disease     UTI (urinary tract infection) 03/09/14   Past Surgical History:  Procedure Laterality Date   AORTIC VALVE REPLACEMENT N/A 09/24/2018   Procedure: AORTIC VALVE REPLACEMENT (AVR) using  an INSPIRIS RESILIA  Prosthetic Valve (Model #: 11500A, Size: 23MM, Serial #: [id number]);  Surgeon: Melrose Nakayama, MD;  Location: [facility];  Service: Open Heart Surgery;  Laterality: N/A;   APPENDECTOMY     taken out with hysterectomy   BLADDER SURGERY     Lift    CARDIAC CATHETERIZATION     COLONOSCOPY  2015   CORONARY ARTERY BYPASS GRAFT N/A 09/24/2018   Procedure: CORONARY ARTERY BYPASS GRAFTING (CABG) x 2; (LIMA to LAD, SVG to PDA) using RIGHT THIGH GREATER SAPHENOUS VEIN GRAFT (SVG) and LEFT INTERNAL MAMMARY ARTERY (LIMA): LIMA to LAD, SVG to PDA ;  Surgeon: Melrose Nakayama, MD;  Location: [facility];  Service: Open Heart Surgery;  Laterality: N/A;   EYE SURGERY Bilateral    cataract surgery with lens implants   lithotripsy   within last 10 years   OOPHORECTOMY     PARTIAL HYSTERECTOMY     RIGHT/LEFT HEART CATH AND CORONARY ANGIOGRAPHY N/A 09/10/2018   Procedure: RIGHT/LEFT HEART CATH AND CORONARY ANGIOGRAPHY;  Surgeon: Lorretta Harp, MD;  Location: [facility] CV LAB;  Service: Cardiovascular;  Laterality: N/A;   TEE WITHOUT CARDIOVERSION N/A 09/24/2018   Procedure: TRANSESOPHAGEAL ECHOCARDIOGRAM (TEE);  Surgeon: Melrose Nakayama, MD;  Location: [facility];  Service: Open Heart Surgery;  Laterality: N/A;   TOTAL KNEE ARTHROPLASTY Right 07/24/2017   Procedure: RIGHT TOTAL KNEE ARTHROPLASTY;  Surgeon: Gaynelle Arabian, MD;  Location: [facility];  Service: Orthopedics;  Laterality: Right;  Adductor Block    Allergies  Allergies  Allergen Reactions   Keflex [Cephalexin] Itching   Repatha [Evolocumab]     Migraine, IBS flaire up, and hair loss   Icosapent Ethyl Other (See Comments)      patient states gives  "gas"   Rosuvastatin Other (See Comments)    Muscle aches   Amoxicillin Rash    Has  patient had a PCN reaction causing immediate rash, facial/tongue/throat swelling, SOB or lightheadedness with hypotension: No Has patient had a PCN reaction causing severe rash involving mucus membranes or skin necrosis: No Has patient had a PCN reaction that required hospitalization: No Has patient had a PCN reaction occurring within the last 10 years: No If all of the above answers are "NO", then may proceed with Cephalosporin use.     Amoxicillin-Pot Clavulanate Rash    Has patient had a PCN reaction causing immediate rash, facial/tongue/throat swelling, SOB or lightheadedness with hypotension: No Has patient had a PCN reaction causing severe rash involving mucus membranes or skin necrosis: No Has patient had a PCN reaction that required hospitalization: No Has patient had a PCN reaction occurring within the last 10 years: No If all of the above answers are "NO", then may proceed with Cephalosporin use.    Ampicillin Rash   Clarithromycin Rash   Clopidogrel Bisulfate Rash   Nitrofurantoin Itching   Plavix [Clopidogrel Bisulfate] Rash   Zestril [Lisinopril] Rash    History of Present Illness    [name] [name] is a 76 y.o. female who presents via audio/video conferencing for a telehealth visit today.  Pt was last seen in cardiology clinic on 05/11/22 by Dr. Gwenlyn Found.  At that time [name] [name] was doing well.  The patient is now pending procedure as outlined above. Since her last visit, she  denies chest pain, shortness of breath, lower extremity edema, fatigue, palpitations, melena, hematuria, hemoptysis, diaphoresis, weakness, presyncope, syncope, orthopnea, and PND.  Home Medications    Prior to Admission medications   Medication Sig Start Date End Date Taking? Authorizing Provider  Alirocumab 150 MG/ML SOAJ Inject 1 Dose into the skin every 14 (fourteen) days. 12/23/21   Hilty, Nadean Corwin, MD  allopurinol (ZYLOPRIM) 100 MG tablet Take 1 tablet (100 mg total) by mouth daily.  10/11/18   Barrett, Evelene Croon, PA-C  amLODipine (NORVASC) 5 MG tablet Take 5 mg by mouth daily.    [provider]  aspirin 81 MG EC tablet Take 1 tablet (81 mg total) by mouth daily. 01/15/19   Lorretta Harp, MD  azithromycin (ZITHROMAX) 500 MG tablet Take 1 tablet (500 mg total) by mouth daily. Take 1 hour prior to dental procedure. 08/11/22   Lorretta Harp, MD  cephALEXin (KEFLEX) 500 MG capsule Take all 4 tablets one hour prior to dental procedure Patient not taking: Reported on 09/01/2022 12/10/18   Lorretta Harp, MD  Cholecalciferol (VITAMIN D) 2000 units tablet Take 1 tablet by mouth daily.    [provider]  ezetimibe (ZETIA) 10 MG tablet Take 1 tablet by mouth once daily 04/11/22   Lorretta Harp, MD  hydrochlorothiazide (MICROZIDE) 12.5 MG capsule Take 1 capsule by mouth once daily 04/11/22  Lorretta Harp, MD  levothyroxine (SYNTHROID) 100 MCG tablet Take 100 mcg by mouth daily before breakfast.    [provider]  losartan (COZAAR) 50 MG tablet Take 1 tablet (50 mg total) by mouth daily. 04/09/19   Lorretta Harp, MD  meclizine (ANTIVERT) 25 MG tablet Take 25 mg by mouth 3 (three) times daily as needed for dizziness.    [provider]  metoprolol tartrate (LOPRESSOR) 25 MG tablet Take 1 tablet by mouth twice daily 04/11/22   Lorretta Harp, MD  traMADol (ULTRAM) 50 MG tablet Take 50 mg by mouth every 4-6 hours PRN severe pain 09/30/18   Nani Skillern, PA-C    Physical Exam    Vital Signs:  [name] does not have vital signs available for review today.  Given telephonic nature of communication, physical exam is limited. AAOx3. NAD. Normal affect.  Speech and respirations are unlabored.  Accessory Clinical Findings    None  Assessment & Plan    1.  Preoperative Cardiovascular Risk Assessment: The patient is doing well from a cardiac perspective. Therefore, based on ACC/AHA guidelines, the patient would be at  acceptable risk for the planned procedure without further cardiovascular testing. The patient was advised that if he develops new symptoms prior to surgery to contact our office to arrange for a follow-up visit, and he verbalized understanding. According to the Revised Cardiac Risk Index (RCRI), her Perioperative Risk of Major Cardiac Event is (%): 0.9. Her Functional Capacity in METs is: 5.07 according to the Duke Activity Status Index (DASI). It is reasonable to hold aspirin for 7 days prior to procedure as long as patient is not having symptoms of ACS and has had no recent coronary intervention. She is currently taking azithromycin prior to dental procedure for SBE prophylaxis.    A copy of this note will be routed to requesting surgeon.  Time:   Today, I have spent 10 minutes with the patient with telehealth technology discussing medical history, symptoms, and management plan.    Emmaline Life, NP-C     09/05/2022, 7:55 PM [address], [address] Office [phone] Fax [phone]

## 2022-09-06 ENCOUNTER — Encounter: Payer: Self-pay | Admitting: Nurse Practitioner

## 2022-09-06 ENCOUNTER — Ambulatory Visit: Payer: PPO | Attending: Internal Medicine | Admitting: Nurse Practitioner

## 2022-09-06 DIAGNOSIS — Z01810 Encounter for preprocedural cardiovascular examination: Secondary | ICD-10-CM | POA: Diagnosis not present

## 2022-09-23 NOTE — Telephone Encounter (Signed)
PA was approved until 02/25/2023 Ref # [id number].

## 2022-12-29 ENCOUNTER — Other Ambulatory Visit: Payer: Self-pay | Admitting: Internal Medicine

## 2022-12-29 MED ORDER — ALIROCUMAB 150 MG/ML ~~LOC~~ SOAJ: 1.0000 | SUBCUTANEOUS | 3 refills | Status: DC

## 2022-12-29 NOTE — Telephone Encounter (Signed)
Routed to PharmD Pool 

## 2022-12-29 NOTE — Telephone Encounter (Signed)
Prescription for Praluent 3 months with 3 refills sent to [facility]

## 2022-12-29 NOTE — Telephone Encounter (Signed)
*  STAT* If patient is at the pharmacy, call can be transferred to refill team.   1. Which medications need to be refilled? (please list name of each medication and dose if known)   Alirocumab 150 MG/ML SOAJ    2. Which pharmacy/location (including street and city if local pharmacy) is medication to be sent to? [facility] - [place], [place] - [address]   3. Do they need a 30 day or 90 day supply? 90 day

## 2023-01-03 ENCOUNTER — Telehealth: Payer: Self-pay | Admitting: Internal Medicine

## 2023-01-03 DIAGNOSIS — E785 Hyperlipidemia, unspecified: Secondary | ICD-10-CM

## 2023-01-03 DIAGNOSIS — Z79899 Other long term (current) drug therapy: Secondary | ICD-10-CM

## 2023-01-03 NOTE — Telephone Encounter (Signed)
Orders placed, patient aware

## 2023-01-03 NOTE — Telephone Encounter (Signed)
Pt states she plans on going to get her labs for lipid panel done tomorrow however she needs an order.

## 2023-01-04 ENCOUNTER — Telehealth: Payer: Self-pay | Admitting: Internal Medicine

## 2023-01-04 NOTE — Telephone Encounter (Signed)
Patient states she is supposed to have fasting labs this morning, but she ate a banana at 2 or 3 am. She would like to know if she can still have the labs done.

## 2023-01-04 NOTE — Telephone Encounter (Signed)
Patient stated she ate a banana around 3 AM. Pt. education on fasting (can take medications with water, no food after midnight). She will get fasting blood work in the morning.

## 2023-01-10 ENCOUNTER — Encounter: Payer: Self-pay | Admitting: Internal Medicine

## 2023-01-10 ENCOUNTER — Ambulatory Visit: Payer: PPO | Attending: Internal Medicine | Admitting: Internal Medicine

## 2023-01-10 VITALS — BP 120/70 | Wt 215.0 lb

## 2023-01-10 DIAGNOSIS — M791 Myalgia, unspecified site: Secondary | ICD-10-CM

## 2023-01-10 DIAGNOSIS — T466X5D Adverse effect of antihyperlipidemic and antiarteriosclerotic drugs, subsequent encounter: Secondary | ICD-10-CM

## 2023-01-10 DIAGNOSIS — Z951 Presence of aortocoronary bypass graft: Secondary | ICD-10-CM

## 2023-01-10 DIAGNOSIS — Z952 Presence of prosthetic heart valve: Secondary | ICD-10-CM

## 2023-01-10 DIAGNOSIS — E785 Hyperlipidemia, unspecified: Secondary | ICD-10-CM

## 2023-01-10 LAB — VITAMIN D 1,25 DIHYDROXY
Vitamin D 1, 25 (OH)2 Total: 69 pg/mL — ABNORMAL HIGH
Vitamin D2 1, 25 (OH)2: <10 pg/mL
Vitamin D3 1, 25 (OH)2: 67 pg/mL

## 2023-01-10 LAB — LIPID PANEL
Chol/HDL Ratio: 3.8 ratio (ref 0.0–4.4)
Cholesterol, Total: 171 mg/dL (ref 100–199)
HDL: 45 mg/dL (ref >39)
LDL Chol Calc (NIH): 80 mg/dL (ref 0–99)
Triglycerides: 279 mg/dL — ABNORMAL HIGH (ref 0–149)
VLDL Cholesterol Cal: 46 mg/dL — ABNORMAL HIGH (ref 5–40)

## 2023-01-10 NOTE — Addendum Note (Signed)
Addended by: Fidel Levy on: 01/10/2023 09:12 AM   Modules accepted: Orders

## 2023-01-10 NOTE — Patient Instructions (Signed)
Medication Instructions:  NO CHANGES  *If you need a refill on your cardiac medications before your next appointment, please call your pharmacy*   Lab Work: FASTING NMR Lipoprofile in 6 months   If you have labs (blood work) drawn today and your tests are completely normal, you will receive your results only by: [facility] (if you have MyChart) OR A paper copy in the mail If you have any lab test that is abnormal or we need to change your treatment, we will call you to review the results.   Testing/Procedures: NONE   Follow-Up: At [facility], you and your health needs are our priority.  As part of our continuing mission to provide you with exceptional heart care, we have created designated Provider Care Teams.  These Care Teams include your primary Cardiologist (physician) and Advanced Practice Providers (APPs -  Physician Assistants and Nurse Practitioners) who all work together to provide you with the care you need, when you need it.  We recommend signing up for the patient portal called "MyChart".  Sign up information is provided on this After Visit Summary.  MyChart is used to connect with patients for Virtual Visits (Telemedicine).  Patients are able to view lab/test results, encounter notes, upcoming appointments, etc.  Non-urgent messages can be sent to your provider as well.   To learn more about what you can do with MyChart, go to [email or web address].    Your next appointment:   6 month(s)  Provider:   Lyman Bishop MD - lipid clinic  Call in Feb or March for a July appointment

## 2023-01-10 NOTE — Progress Notes (Signed)
Virtual Visit via video note   This visit type was conducted due to national recommendations for restrictions regarding the COVID-19 Pandemic (e.g. social distancing) in an effort to limit this patient's exposure and mitigate transmission in our community.  Due to her co-morbid illnesses, this patient is at least at moderate risk for complications without adequate follow up.  This format is felt to be most appropriate for this patient at this time.  The patient does have access to video technology.  All issues noted in this document were discussed and addressed.  A limited physical exam could be performed with this format.  Please refer to the patient's chart for her  consent to telehealth for [facility].   Date:  01/10/2023   ID:  [name] [name], DOB 05/15/1946, MRN [id number] The patient was identified using 2 identifiers.  Evaluation Performed:  Follow-Up Visit  Patient Location:  [address] [place] [address]  Provider location:   [address] [place], [place] [address]  PCP:  Manfred Shirts, [place]  Cardiologist:  Quay Burow, MD Electrophysiologist:  None   Chief Complaint:  High cholesterol  History of Present Illness:    [name] [name] is a 77 y.o. female who presents via audio/video conferencing for a telehealth visit today.  [name] [name] returns today for follow-up of dyslipidemia.  Her cholesterol continues to be high.  In the past she had taken Repatha but reported she had side effects including worsening of her IBS.  A year ago however her cholesterol had come down to a total 165, triglycerides 223, HDL 40 and LDL 87.  Subsequently she stopped the medication and her cholesterol then went up to total 271, triglycerides 262 HDL 53 and LDL 169.  We discussed several options and I felt that she might want to try Praluent as an alternative, however ultimately she did try it and says she still had side effects.  She wanted to work on diet alone.  She  continues on low-dose ezetimibe.  A repeat lipid however 2 days ago show virtually no change in her lipids.  Total cholesterol now 253, triglycerides 274, HDL 41 and LDL 161.  She is quite disappointed with this and felt that she would have a better response with just diet however it seems that much of this is genetic.  We discussed some other possible options including Leqvio which is quite costly and somewhat cumbersome however we might be able to get approval, or possibly Nexletol, however she does have a history of gout and this could certainly precipitate gout.  09/23/2021  [name] [name] returns today via video follow-up.  Overall she says she is doing well.  Fortunately she is tolerating Praluent much better than she did Repatha.  This is evidenced by marked improvement in her lipids.  Total cholesterol now 132 (down from 253), triglycerides 195 (down from 274), HDL is gone up 5 points and LDL cholesterol is now 54 (down from 161).  01/10/2023  [name] [name] is seen today in follow-up via video visit.  Her cholesterol has gone up slightly which may have been due to the holidays and having missed a couple of doses of her Praluent.  Her total is now 171, triglycerides 279, HDL 45 and LDL 80.  She might be a little less physically active.  All this could likely explain the changes.  We discussed ways to better mitigate this and my suggestion of repeating her lipids in about 6 months with  no changes in her medications.   Prior CV studies:   The following studies were reviewed today:  Chart reviewed, lab work  PMHx:  Past Medical History:  Diagnosis Date   Aortic stenosis, severe 08/2018   progressed from moderate by ECHO 01-03-17    Arthritis    Cancer ([place])    basal cell skin cancer   Carotid artery bruit    Cataract    bilateral repaired   Claustrophobia    Coronary artery disease    Diverticulosis    Family history of heart disease    GERD (gastroesophageal reflux disease)    Gout    Heart  murmur    repaired   History of hiatal hernia    History of kidney stones    Hyperlipemia    Hypertension    Irritable bowel syndrome    Stroke [facility])    old- 25 yrs ago found on a test.   Thyroid disease    UTI (urinary tract infection) 03/09/14    Past Surgical History:  Procedure Laterality Date   AORTIC VALVE REPLACEMENT N/A 09/24/2018   Procedure: AORTIC VALVE REPLACEMENT (AVR) using  an INSPIRIS RESILIA  Prosthetic Valve (Model #: 11500A, Size: 23MM, Serial #: [id number]);  Surgeon: Melrose Nakayama, MD;  Location: [place];  Service: Open Heart Surgery;  Laterality: N/A;   APPENDECTOMY     taken out with hysterectomy   BLADDER SURGERY     Lift    CARDIAC CATHETERIZATION     COLONOSCOPY  2015   CORONARY ARTERY BYPASS GRAFT N/A 09/24/2018   Procedure: CORONARY ARTERY BYPASS GRAFTING (CABG) x 2; (LIMA to LAD, SVG to PDA) using RIGHT THIGH GREATER SAPHENOUS VEIN GRAFT (SVG) and LEFT INTERNAL MAMMARY ARTERY (LIMA): LIMA to LAD, SVG to PDA ;  Surgeon: Melrose Nakayama, MD;  Location: [facility];  Service: Open Heart Surgery;  Laterality: N/A;   EYE SURGERY Bilateral    cataract surgery with lens implants   lithotripsy   within last 10 years   OOPHORECTOMY     PARTIAL HYSTERECTOMY     RIGHT/LEFT HEART CATH AND CORONARY ANGIOGRAPHY N/A 09/10/2018   Procedure: RIGHT/LEFT HEART CATH AND CORONARY ANGIOGRAPHY;  Surgeon: Lorretta Harp, MD;  Location: [facility];  Service: Cardiovascular;  Laterality: N/A;   TEE WITHOUT CARDIOVERSION N/A 09/24/2018   Procedure: TRANSESOPHAGEAL ECHOCARDIOGRAM (TEE);  Surgeon: Melrose Nakayama, MD;  Location: [place];  Service: Open Heart Surgery;  Laterality: N/A;   TOTAL KNEE ARTHROPLASTY Right 07/24/2017   Procedure: RIGHT TOTAL KNEE ARTHROPLASTY;  Surgeon: Gaynelle Arabian, MD;  Location: [facility];  Service: Orthopedics;  Laterality: Right;  Adductor Block    FAMHx:  Family History  Problem Relation Age of Onset   Heart disease Mother    Heart  disease Father    Colon cancer Neg Hx    Colon polyps Neg Hx    Esophageal cancer Neg Hx    Rectal cancer Neg Hx    Stomach cancer Neg Hx     SOCHx:   reports that she quit smoking about 35 years ago. Her smoking use included cigarettes. She has a 16.00 pack-year smoking history. She has never used smokeless tobacco. She reports that she does not drink alcohol and does not use drugs.  ALLERGIES:  Allergies  Allergen Reactions   Keflex [Cephalexin] Itching   Repatha [Evolocumab]     Migraine, IBS flaire up, and hair loss   Icosapent Ethyl Other (See Comments)  patient states gives  "gas"   Rosuvastatin Other (See Comments)    Muscle aches   Amoxicillin Rash    Has patient had a PCN reaction causing immediate rash, facial/tongue/throat swelling, SOB or lightheadedness with hypotension: No Has patient had a PCN reaction causing severe rash involving mucus membranes or skin necrosis: No Has patient had a PCN reaction that required hospitalization: No Has patient had a PCN reaction occurring within the last 10 years: No If all of the above answers are "NO", then may proceed with Cephalosporin use.     Amoxicillin-Pot Clavulanate Rash    Has patient had a PCN reaction causing immediate rash, facial/tongue/throat swelling, SOB or lightheadedness with hypotension: No Has patient had a PCN reaction causing severe rash involving mucus membranes or skin necrosis: No Has patient had a PCN reaction that required hospitalization: No Has patient had a PCN reaction occurring within the last 10 years: No If all of the above answers are "NO", then may proceed with Cephalosporin use.    Ampicillin Rash   Clarithromycin Rash   Clopidogrel Bisulfate Rash   Nitrofurantoin Itching   Plavix [Clopidogrel Bisulfate] Rash   Zestril [Lisinopril] Rash    MEDS:  Current Meds  Medication Sig   Alirocumab 150 MG/ML SOAJ Inject 1 Dose into the skin every 14 (fourteen) days.   allopurinol  (ZYLOPRIM) 100 MG tablet Take 1 tablet (100 mg total) by mouth daily.   amLODipine (NORVASC) 5 MG tablet Take 5 mg by mouth daily.   aspirin 81 MG EC tablet Take 1 tablet (81 mg total) by mouth daily.   azithromycin (ZITHROMAX) 500 MG tablet Take 1 tablet (500 mg total) by mouth daily. Take 1 hour prior to dental procedure.   Cholecalciferol (VITAMIN D) 2000 units tablet Take 1 tablet by mouth daily.   hydrochlorothiazide (MICROZIDE) 12.5 MG capsule Take 1 capsule by mouth once daily   levothyroxine (SYNTHROID) 100 MCG tablet Take 100 mcg by mouth daily before breakfast.   losartan (COZAAR) 50 MG tablet Take 1 tablet (50 mg total) by mouth daily.   meclizine (ANTIVERT) 25 MG tablet Take 25 mg by mouth 3 (three) times daily as needed for dizziness.   metoprolol tartrate (LOPRESSOR) 25 MG tablet Take 1 tablet by mouth twice daily   traMADol (ULTRAM) 50 MG tablet Take 50 mg by mouth every 4-6 hours PRN severe pain     ROS: Pertinent items noted in HPI and remainder of comprehensive ROS otherwise negative.  Labs/Other Tests and Data Reviewed:    Recent Labs: No results found for requested labs within last 365 days.   Recent Lipid Panel Lab Results  Component Value Date/Time   CHOL 171 01/05/2023 08:36 AM   TRIG 279 (H) 01/05/2023 08:36 AM   HDL 45 01/05/2023 08:36 AM   CHOLHDL 3.8 01/05/2023 08:36 AM   LDLCALC 80 01/05/2023 08:36 AM    Wt Readings from Last 3 Encounters:  01/10/23 215 lb (97.5 kg)  05/11/22 226 lb 6.4 oz (102.7 kg)  09/23/21 218 lb (98.9 kg)     Exam:    Vital Signs:  BP 120/70   Wt 215 lb (97.5 kg)   BMI 35.78 kg/m    General appearance: alert and no distress Lungs: No visual respiratory difficulty Abdomen: Moderately obese Extremities: extremities normal, atraumatic, no cyanosis or edema Skin: Skin color, texture, turgor normal. No rashes or lesions Neurologic: Grossly normal Psych: Pleasant  ASSESSMENT & PLAN:    Mixed dyslipidemia, goal LDL less  than 70 Relative statin intolerance-myalgias Repatha intolerant Coronary artery disease status post CABG x2 Aortic stenosis status post AVR IBS with recurrent symptoms  [name] [name] has done well on combination therapy with Repatha and ezetimibe.  Her LDL however has gone up somewhat and now is above target.  This may have been due to diet over the holidays or less physical activity.  She also missed a couple doses of the Praluent due to being in the donut hole and may not be back to her low value as of the time she had her labs checked.  Will plan repeat lipids in about 6 months and continue to work with diet and exercise in addition to her medical therapy.  COVID-19 Education: The signs and symptoms of COVID-19 were discussed with the patient and how to seek care for testing (follow up with PCP or arrange E-visit).  The importance of social distancing was discussed today.  Patient Risk:   After full review of this patients clinical status, I feel that they are at least moderate risk at this time.  Time:   Today, I have spent 15 minutes with the patient with telehealth technology discussing dyslipidemia, PCSK9 inhibitors.     Medication Adjustments/Labs and Tests Ordered: Current medicines are reviewed at length with the patient today.  Concerns regarding medicines are outlined above.   Tests Ordered: No orders of the defined types were placed in this encounter.    Medication Changes: No orders of the defined types were placed in this encounter.    Disposition:  in 1 year(s)  Pixie Casino, MD, [facility], [place] Director of the Advanced Lipid Disorders &  Cardiovascular Risk Reduction Clinic Diplomate of the American Board of Clinical Lipidology Attending Cardiologist  Direct Dial: [phone]  Fax: [phone]  Website:  [email or web address]  Pixie Casino, MD  01/10/2023 8:51 AM

## 2023-01-11 ENCOUNTER — Other Ambulatory Visit (HOSPITAL_COMMUNITY): Payer: Self-pay

## 2023-01-31 ENCOUNTER — Telehealth: Payer: Self-pay | Admitting: Internal Medicine

## 2023-01-31 NOTE — Telephone Encounter (Signed)
Patient calling in bout her vitamin D results. Please advise

## 2023-01-31 NOTE — Telephone Encounter (Signed)
Spoke with patient about her lab results. Patient verbalizes understanding. No further questions or concerns at this time.

## 2023-02-20 ENCOUNTER — Telehealth: Payer: Self-pay | Admitting: Cardiovascular Disease

## 2023-02-20 MED ORDER — AZITHROMYCIN 500 MG PO TABS: 500.0000 mg | ORAL_TABLET | ORAL | 4 refills | Status: DC | PRN

## 2023-02-20 NOTE — Telephone Encounter (Signed)
Pt c/o medication issue:  1. Name of Medication:   azithromycin (ZITHROMAX) 500 MG tablet    2. How are you currently taking this medication (dosage and times per day)?   3. Are you having a reaction (difficulty breathing--STAT)?   4. What is your medication issue? Pt would like a call back to verify that she still takes this medication before a dental procedure she is having this Friday. She states it is just a filling and had this from a previous procedure and would like to verify it is still okay to take.

## 2023-02-20 NOTE — Telephone Encounter (Addendum)
Late entry spoke to patient earlier . Patient is aware of SBE proph.and that she does not needed anything for biopsy

## 2023-02-20 NOTE — Telephone Encounter (Signed)
She would need abx for dental procedure, but she would not need it for skin biopsy, at least from a cardiac prospective.

## 2023-02-20 NOTE — Telephone Encounter (Signed)
RN informed patient she would need  antibiotic for dental work February 24, 2023.  Patient has refills from previous  dental procedures.   Patient also stated she will be having a skin biopsy on eyelid above n eye on Monday  February 27, 2023  She wanted to know if she needed to  take antibiotics for the procedure . She states she has a call into her dermatologist to get clarification.  RN informed patient if she needs a clearance for- SBE treatment- Dermatology office will need to send a pre-op clearance for procedure.

## 2023-02-20 NOTE — Telephone Encounter (Signed)
Left detailed message (ok per DPR) regarding antibiotics needed for dental procedures. Advised pt that antibiotics would not be needed for skin biopsy. Requested that dermatology send [place] clearance. Refill sent in for azithromycin. Instructed pt to call back with questions or concerns.

## 2023-03-07 ENCOUNTER — Encounter (HOSPITAL_COMMUNITY): Payer: Self-pay | Admitting: Cardiovascular Disease

## 2023-03-07 ENCOUNTER — Ambulatory Visit (HOSPITAL_BASED_OUTPATIENT_CLINIC_OR_DEPARTMENT_OTHER): Payer: PPO | Attending: Cardiovascular Disease

## 2023-03-13 ENCOUNTER — Other Ambulatory Visit: Payer: Self-pay | Admitting: Cardiovascular Disease

## 2023-03-29 ENCOUNTER — Ambulatory Visit (HOSPITAL_BASED_OUTPATIENT_CLINIC_OR_DEPARTMENT_OTHER)
Admission: RE | Admit: 2023-03-29 | Discharge: 2023-03-29 | Disposition: A | Discharge status: Home / Self Care | Payer: PPO | Source: Ambulatory Visit | Attending: Cardiovascular Disease | Admitting: Cardiovascular Disease

## 2023-03-29 DIAGNOSIS — Z953 Presence of xenogenic heart valve: Secondary | ICD-10-CM | POA: Diagnosis present

## 2023-03-29 DIAGNOSIS — I10 Essential (primary) hypertension: Secondary | ICD-10-CM

## 2023-03-29 DIAGNOSIS — I7121 Aneurysm of the ascending aorta, without rupture: Secondary | ICD-10-CM

## 2023-03-29 DIAGNOSIS — I2510 Atherosclerotic heart disease of native coronary artery without angina pectoris: Secondary | ICD-10-CM | POA: Diagnosis not present

## 2023-03-29 DIAGNOSIS — E7800 Pure hypercholesterolemia, unspecified: Secondary | ICD-10-CM

## 2023-03-29 LAB — ECHOCARDIOGRAM COMPLETE
AR max vel: 1.68 cm2
AV Area VTI: 1.92 cm2
AV Area mean vel: 1.72 cm2
AV Mean grad: 11 mmHg
AV Peak grad: 18.3 mmHg
Ao pk vel: 2.14 m/s
Area-P 1/2: 4.01 cm2
MV M vel: 4.81 m/s
MV Peak grad: 92.5 mmHg
S' Lateral: 2.6 cm

## 2023-05-24 ENCOUNTER — Ambulatory Visit: Payer: PPO | Attending: Cardiovascular Disease | Admitting: Cardiovascular Disease

## 2023-05-24 ENCOUNTER — Encounter: Payer: Self-pay | Admitting: Cardiovascular Disease

## 2023-05-24 VITALS — BP 142/82 | HR 68 | Ht 65.0 in | Wt 220.8 lb

## 2023-05-24 DIAGNOSIS — E7800 Pure hypercholesterolemia, unspecified: Secondary | ICD-10-CM | POA: Diagnosis not present

## 2023-05-24 DIAGNOSIS — Z953 Presence of xenogenic heart valve: Secondary | ICD-10-CM | POA: Diagnosis not present

## 2023-05-24 DIAGNOSIS — I10 Essential (primary) hypertension: Secondary | ICD-10-CM | POA: Diagnosis not present

## 2023-05-24 DIAGNOSIS — I7121 Aneurysm of the ascending aorta, without rupture: Secondary | ICD-10-CM | POA: Diagnosis not present

## 2023-05-24 NOTE — Progress Notes (Signed)
05/24/2023 [name] [name]   02/04/1946  [id number]  Primary Physician [name] Cleverly, PA Primary Cardiologist: [name] Gess MD FACP, [place], [place], [place]  HPI:  [name] [name] is a 77 y.o.   mildly overweight married Caucasian female mother of 3 children, grandmother to 6 grandchildren who I last saw in the office 05/11/2022.Marland Kitchen  She was self-referred to be established in our practice, a friend of hers is my patient. 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 Tula [name]. Her cardiovascular risk factor profile is notable for family history of heart disease with both parents died of myocardial infarctions in their 92s and 2s and a brother who died of an MI at 54. She has never had a heart attack or a clinical stroke. Risk factors otherwise remarkable for treated hypertension and hyperlipidemia. She does have GERD.  She had negative nuclear stress test and 2-D echocardiogram at [facility] 3 years ago. She does have a history of a murmur and had a recent 2-D echo performed 01/30/18 revealing progression of her aortic stenosis now with a peak gradient of 70 and a valve area 1.17 cm. She does complain of some dyspnea on exertion. She denies chest pain. Because of symptoms of dyspnea as well as a 2D echo that revealed severe lAS.I performed outpatient cardiac cath on her 09/10/2018 revealing a total mid RCA with high-grade proximal LAD disease and severe aortic stenosis.  She ultimately underwent CABG x2 with a LIMA to her LAD and a vein to the PDA as well as pericardial tissue aortic valve replacement by Dr. Dorris [name] 09/24/2018 and was discharged a week later.  She is recuperating nicely.  Her symptoms preoperative dyspnea have resolved.   She had a 2D echo performed 01/21/2019 that revealed normal LV systolic function with a well-functioning aortic bioprosthesis.  The thoracic  aorta measured 4.7 cm on that study and therefore follow-up CT of her chest was done to confirm this which only showed the aortic dimension of 41 mm.  She still still complains of some shortness of breath but markedly improved since her aortic valve replacement.  She recently returned from a 9-day trip to [facility] which was therapeutic for her.   Since I saw her a year ago she continues to do well.  She denies chest pain or shortness of breath.  She is seeing Dr. Rennis [name] in the lipid clinic unfortunately is intolerant to statin therapy as well as Repatha.  She started Praluent with a satisfactory clinical result with her most recent lipid profile performed 01/05/2023 that was nonfasting revealing total cholesterol 171, LDL of 80 and HDL 45.  Her triglyceride level was elevated at 279.  Her most recent 2D echo performed 03/29/2023 revealing a well-functioning aortic bioprosthesis.  She recently had a small melanoma removed from the skin surrounding her right eye.  Current Meds  Medication Sig   Alirocumab 150 MG/ML SOAJ Inject 1 Dose into the skin every 14 (fourteen) days.   allopurinol (ZYLOPRIM) 100 MG tablet Take 1 tablet (100 mg total) by mouth daily.   amLODipine (NORVASC) 5 MG tablet Take 5 mg by mouth daily.   aspirin 81 MG EC tablet Take 1 tablet (81 mg total) by mouth daily.   azithromycin (ZITHROMAX) 500 MG tablet Take 1 tablet (500 mg total) by mouth as needed (for dental procedures). Take 1  hour prior to dental procedure.   Cholecalciferol (VITAMIN D) 2000 units tablet Take 1 tablet by mouth daily.   hydrochlorothiazide (MICROZIDE) 12.5 MG capsule Take 1 capsule by mouth once daily   levothyroxine (SYNTHROID) 100 MCG tablet Take 100 mcg by mouth daily before breakfast.   losartan (COZAAR) 50 MG tablet Take 1 tablet (50 mg total) by mouth daily.   meclizine (ANTIVERT) 25 MG tablet Take 25 mg by mouth 3 (three) times daily as needed for dizziness.   metoprolol tartrate (LOPRESSOR) 25 MG  tablet Take 1 tablet by mouth twice daily   traMADol (ULTRAM) 50 MG tablet Take 50 mg by mouth every 4-6 hours PRN severe pain     Allergies  Allergen Reactions   Keflex [Cephalexin] Itching   Repatha [Evolocumab]     Migraine, IBS flaire up, and hair loss   Icosapent Ethyl Other (See Comments)      patient states gives  "gas"   Rosuvastatin Other (See Comments)    Muscle aches   Amoxicillin Rash    Has patient had a PCN reaction causing immediate rash, facial/tongue/throat swelling, SOB or lightheadedness with hypotension: No Has patient had a PCN reaction causing severe rash involving mucus membranes or skin necrosis: No Has patient had a PCN reaction that required hospitalization: No Has patient had a PCN reaction occurring within the last 10 years: No If all of the above answers are "NO", then may proceed with Cephalosporin use.     Amoxicillin-Pot Clavulanate Rash    Has patient had a PCN reaction causing immediate rash, facial/tongue/throat swelling, SOB or lightheadedness with hypotension: No Has patient had a PCN reaction causing severe rash involving mucus membranes or skin necrosis: No Has patient had a PCN reaction that required hospitalization: No Has patient had a PCN reaction occurring within the last 10 years: No If all of the above answers are "NO", then may proceed with Cephalosporin use.    Ampicillin Rash   Clarithromycin Rash   Clopidogrel Bisulfate Rash   Nitrofurantoin Itching   Plavix [Clopidogrel Bisulfate] Rash   Zestril [Lisinopril] Rash    Social History   Socioeconomic History   Marital status: Widowed    Spouse name: Not on file   Number of children: Not on file   Years of education: Not on file   Highest education level: Not on file  Occupational History   Occupation: Retired  Tobacco Use   Smoking status: Former    Packs/day: 0.80    Years: 20.00    Additional pack years: 0.00    Total pack years: 16.00    Types: Cigarettes    Quit  date: 09/21/1987    Years since quitting: 35.6   Smokeless tobacco: Never   Tobacco comments:    socially  Vaping Use   Vaping Use: Never used  Substance and Sexual Activity   Alcohol use: No   Drug use: No   Sexual activity: Not on file  Other Topics Concern   Not on file  Social History Narrative   Not on file   Social Determinants of Health   Financial Resource Strain: Not on file  Food Insecurity: Not on file  Transportation Needs: No Transportation Needs (01/22/2019)   PRAPARE - Administrator, Civil Service (Medical): No    Lack of Transportation (Non-Medical): No  Physical Activity: Inactive (01/22/2019)   Exercise Vital Sign    Days of Exercise per Week: 0 days    Minutes of Exercise  per Session: 0 min  Stress: No Stress Concern Present (01/22/2019)   [employer] of Occupational Health - Occupational Stress Questionnaire    Feeling of Stress : Only a little  Social Connections: Not on file  Intimate Partner Violence: Not on file     Review of Systems: General: negative for chills, fever, night sweats or weight changes.  Cardiovascular: negative for chest pain, dyspnea on exertion, edema, orthopnea, palpitations, paroxysmal nocturnal dyspnea or shortness of breath Dermatological: negative for rash Respiratory: negative for cough or wheezing Urologic: negative for hematuria Abdominal: negative for nausea, vomiting, diarrhea, bright red blood per rectum, melena, or hematemesis Neurologic: negative for visual changes, syncope, or dizziness All other systems reviewed and are otherwise negative except as noted above.    Blood pressure (!) 142/82, pulse 68, height 5\' 5"  (1.651 m), weight 220 lb 12.8 oz (100.2 kg), SpO2 99 %.  General appearance: alert and no distress Neck: no adenopathy, no carotid bruit, no JVD, supple, symmetrical, trachea midline, and thyroid not enlarged, symmetric, no tenderness/mass/nodules Lungs: clear to auscultation  bilaterally Heart: regular rate and rhythm, S1, S2 normal, no murmur, click, rub or gallop Extremities: extremities normal, atraumatic, no cyanosis or edema Pulses: 2+ and symmetric Skin: Skin color, texture, turgor normal. No rashes or lesions Neurologic: Grossly normal  EKG sinus rhythm at 68 without ST or T wave changes.  I personally reviewed this EKG.  ASSESSMENT AND PLAN:   HYPERCHOLESTEROLEMIA History of hyperlipidemia on Praluent with nonfasting lipid profile performed 01/05/2023 revealing a total cholesterol 171, LDL 80 and HDL of 45.  Her triglyceride level was elevated at 279.  Essential hypertension History of essential hypertension blood pressure measured today at 142/82.  She is on losartan and metoprolol.  S/P aortic valve replacement with bioprosthetic valve History of CAD and severe aortic stenosis status post cardiac catheterization performed by myself 09/10/2018 revealing a total mid RCA with high-grade proximal LAD disease and severe aortic stenosis.  She underwent CABG x 2 with a LIMA to LAD and a vein to the PDA as well as a pericardial tissue valve replacement by Dr. Dorris [name] 09/24/2018.  This is followed on an annual basis by 2D echo most recently 03/28/2013 revealing normal LV systolic function with a normally functioning aortic bioprosthesis.  She is completely asymptomatic.  Thoracic aortic aneurysm [facility]) Thoracic aortic aneurysm in the past however her most recent CTA performed 02/17/2021 revealed her ascending thoracic aorta aorta measured 38 mm.  We will recheck this in 2 to 3 years.     [name] Gess MD FACP,FACC,FAHA, [facility] 05/24/2023 11:31 AM

## 2023-05-24 NOTE — Assessment & Plan Note (Signed)
History of CAD and severe aortic stenosis status post cardiac catheterization performed by myself 09/10/2018 revealing a total mid RCA with high-grade proximal LAD disease and severe aortic stenosis.  She underwent CABG x 2 with a LIMA to LAD and a vein to the PDA as well as a pericardial tissue valve replacement by Dr. Dorris Fetch 09/24/2018.  This is followed on an annual basis by 2D echo most recently 03/28/2013 revealing normal LV systolic function with a normally functioning aortic bioprosthesis.  She is completely asymptomatic.

## 2023-05-24 NOTE — Assessment & Plan Note (Signed)
Thoracic aortic aneurysm in the past however her most recent CTA performed 02/17/2021 revealed her ascending thoracic aorta aorta measured 38 mm.  We will recheck this in 2 to 3 years.

## 2023-05-24 NOTE — Assessment & Plan Note (Signed)
History of essential hypertension blood pressure measured today at 142/82.  She is on losartan and metoprolol.

## 2023-05-24 NOTE — Assessment & Plan Note (Signed)
History of hyperlipidemia on Praluent with nonfasting lipid profile performed 01/05/2023 revealing a total cholesterol 171, LDL 80 and HDL of 45.  Her triglyceride level was elevated at 279.

## 2023-05-24 NOTE — Patient Instructions (Signed)
Medication Instructions:  Your physician recommends that you continue on your current medications as directed. Please refer to the Current Medication list given to you today.  *If you need a refill on your cardiac medications before your next appointment, please call your pharmacy*   Testing/Procedures: Your physician has requested that you have an echocardiogram. Echocardiography is a painless test that uses sound waves to create images of your heart. It provides your doctor with information about the size and shape of your heart and how well your heart's chambers and valves are working. This procedure takes approximately one hour. There are no restrictions for this procedure. Please do NOT wear cologne, perfume, aftershave, or lotions (deodorant is allowed). Please arrive 15 minutes prior to your appointment time. This will take place at [address]. To do in April 2025.    Follow-Up: At [facility], you and your health needs are our priority.  As part of our continuing mission to provide you with exceptional heart care, we have created designated Provider Care Teams.  These Care Teams include your primary Cardiologist (physician) and Advanced Practice Providers (APPs -  Physician Assistants and Nurse Practitioners) who all work together to provide you with the care you need, when you need it.  We recommend signing up for the patient portal called "MyChart".  Sign up information is provided on this After Visit Summary.  MyChart is used to connect with patients for Virtual Visits (Telemedicine).  Patients are able to view lab/test results, encounter notes, upcoming appointments, etc.  Non-urgent messages can be sent to your provider as well.   To learn more about what you can do with MyChart, go to [email or web address].    Your next appointment:   12 month(s)  Provider:   Nanetta Batty, MD

## 2023-06-08 ENCOUNTER — Other Ambulatory Visit: Payer: Self-pay | Admitting: Cardiovascular Disease

## 2023-06-23 ENCOUNTER — Other Ambulatory Visit: Payer: Self-pay | Admitting: Cardiovascular Disease

## 2023-07-05 ENCOUNTER — Telehealth: Payer: Self-pay | Admitting: Internal Medicine

## 2023-07-05 DIAGNOSIS — E785 Hyperlipidemia, unspecified: Secondary | ICD-10-CM

## 2023-07-05 NOTE — Telephone Encounter (Signed)
Can we add Vitamin D to lab orders

## 2023-07-05 NOTE — Telephone Encounter (Signed)
Vit D ordered.

## 2023-07-05 NOTE — Telephone Encounter (Signed)
New Message:     Patient says she is coming in for lab work this week. She says she would like to have a Vitamin D order added to her order please.

## 2023-07-06 ENCOUNTER — Telehealth: Payer: Self-pay | Admitting: Internal Medicine

## 2023-07-06 NOTE — Telephone Encounter (Signed)
Call to patient. Received message "Portal for Mailbox could not  be found. " Call to cell number. Patient  came to [facility] office. She states she was to have blood work. They did not have her lab order.  She states they did her lab for Vitamin D but not the Lipid. Verified with Lab that both labs were drawn.  She starts to discuss the labs and insurance and continues with concerns . Advised again that her labs were drawn and that orders were obtained and the Diagnosis needed went with her labs for her insurance.

## 2023-07-06 NOTE — Telephone Encounter (Signed)
Pt states she was supposed to get blood work done for the upcoming appointment. Pt states it was not in the system but still got it done and wants to make sure it is covered by insurance and to make sure Dr. Rennis Golden has her blood work before the appointment. Please advise.

## 2023-07-08 LAB — NMR, LIPOPROFILE
Cholesterol, Total: 174 mg/dL (ref 100–199)
HDL Particle Number: 34.8 umol/L (ref >=30.5)
HDL-C: 47 mg/dL (ref >39)
LDL Particle Number: 1472 nmol/L — ABNORMAL HIGH (ref <1000)
LDL Size: 20.1 nm — ABNORMAL LOW (ref >20.5)
LDL-C (NIH Calc): 87 mg/dL (ref 0–99)
LP-IR Score: 77 — ABNORMAL HIGH (ref <=45)
Small LDL Particle Number: 948 nmol/L — ABNORMAL HIGH (ref <=527)
Triglycerides: 243 mg/dL — ABNORMAL HIGH (ref 0–149)

## 2023-07-08 LAB — VITAMIN D 25 HYDROXY (VIT D DEFICIENCY, FRACTURES): Vit D, 25-Hydroxy: 48.2 ng/mL (ref 30.0–100.0)

## 2023-07-12 ENCOUNTER — Telehealth: Payer: Self-pay | Admitting: Internal Medicine

## 2023-07-12 ENCOUNTER — Ambulatory Visit: Payer: PPO | Attending: Cardiology | Admitting: Internal Medicine

## 2023-07-12 ENCOUNTER — Encounter: Payer: Self-pay | Admitting: Internal Medicine

## 2023-07-12 VITALS — Ht 65.0 in

## 2023-07-12 DIAGNOSIS — E785 Hyperlipidemia, unspecified: Secondary | ICD-10-CM

## 2023-07-12 DIAGNOSIS — M791 Myalgia, unspecified site: Secondary | ICD-10-CM | POA: Diagnosis not present

## 2023-07-12 DIAGNOSIS — Z951 Presence of aortocoronary bypass graft: Secondary | ICD-10-CM

## 2023-07-12 NOTE — Telephone Encounter (Signed)
She ask when started the program with Dr Rennis Golden she wants her Lipid from when she started.  Gave labs from 02/15/21.  She discusses her labs appointments etcetera.  At this time I asked if she needed anything else from me.  She does not answer. She had an appt.  this morning but did not ask these questions . She goes on then ask does she need anything else.  I said she was good.

## 2023-07-12 NOTE — Progress Notes (Signed)
Virtual Visit via Telephone note   This visit type was conducted due to national recommendations for restrictions regarding the COVID-19 Pandemic (e.g. social distancing) in an effort to limit this patient's exposure and mitigate transmission in our community.  Due to her co-morbid illnesses, this patient is at least at moderate risk for complications without adequate follow up.  This format is felt to be most appropriate for this patient at this time.  The patient does have access to video technology.  All issues noted in this document were discussed and addressed.  A limited physical exam could be performed with this format.  Please refer to the patient's chart for her  consent to telehealth for [facility]   Date:  07/12/2023   ID:  [name] [name], DOB 1946-10-06, MRN [id number] The patient was identified using 2 identifiers.  Evaluation Performed:  Follow-Up Visit  Patient Location:  [address] [facility] [address]  Provider location:   [address], [address] [place], [place] [address]  PCP:  Shellia Cleverly, [place]  Cardiologist:  Nanetta Batty, MD Electrophysiologist:  None   Chief Complaint:  High cholesterol  History of Present Illness:    [name] [name] is a 77 y.o. female who presents via audio/video conferencing for a telehealth visit today.  [name] returns today for follow-up of dyslipidemia.  Her cholesterol continues to be high.  In the past she had taken Repatha but reported she had side effects including worsening of her IBS.  A year ago however her cholesterol had come down to a total 165, triglycerides 223, HDL 40 and LDL 87.  Subsequently she stopped the medication and her cholesterol then went up to total 271, triglycerides 262 HDL 53 and LDL 169.  We discussed several options and I felt that she might want to try Praluent as an alternative, however ultimately she did try it and says she still had side effects.  She wanted to work on diet alone.  She  continues on low-dose ezetimibe.  A repeat lipid however 2 days ago show virtually no change in her lipids.  Total cholesterol now 253, triglycerides 274, HDL 41 and LDL 161.  She is quite disappointed with this and felt that she would have a better response with just diet however it seems that much of this is genetic.  We discussed some other possible options including Leqvio which is quite costly and somewhat cumbersome however we might be able to get approval, or possibly Nexletol, however she does have a history of gout and this could certainly precipitate gout.  09/23/2021  [name] returns today via video follow-up.  Overall she says she is doing well.  Fortunately she is tolerating Praluent much better than she did Repatha.  This is evidenced by marked improvement in her lipids.  Total cholesterol now 132 (down from 253), triglycerides 195 (down from 274), HDL is gone up 5 points and LDL cholesterol is now 54 (down from 161).  01/10/2023  [name] is seen today in follow-up via video visit.  Her cholesterol has gone up slightly which may have been due to the holidays and having missed a couple of doses of her Praluent.  Her total is now 171, triglycerides 279, HDL 45 and LDL 80.  She might be a little less physically active.  All this could likely explain the changes.  We discussed ways to better mitigate this and my suggestion of repeating her lipids in about 6 months with  no changes in her medications.  07/12/2023  [name] was seen today via telephone visit. She said she was unable to do a video visit. Her lipids are a little worse than previous with an LDL of 80 - she could not tolerate ezetimibe and statins, so she is on Praluent monotherapy. Nexletol is cost-prohibitive for her.   Prior CV studies:   The following studies were reviewed today:  Chart reviewed, lab work  PMHx:  Past Medical History:  Diagnosis Date   Aortic stenosis, severe 08/2018   progressed from moderate by  ECHO 01-03-17    Arthritis    Cancer (HCC)    basal cell skin cancer   Carotid artery bruit    Cataract    bilateral repaired   Claustrophobia    Coronary artery disease    Diverticulosis    Family history of heart disease    GERD (gastroesophageal reflux disease)    Gout    Heart murmur    repaired   History of hiatal hernia    History of kidney stones    Hyperlipemia    Hypertension    Irritable bowel syndrome    Stroke [facility])    old- 25 yrs ago found on a test.   Thyroid disease    UTI (urinary tract infection) 03/09/14    Past Surgical History:  Procedure Laterality Date   AORTIC VALVE REPLACEMENT N/A 09/24/2018   Procedure: AORTIC VALVE REPLACEMENT (AVR) using  an INSPIRIS RESILIA  Prosthetic Valve (Model #: 11500A, Size: , Serial #: [id number]);  Surgeon: Loreli Slot, MD;  Location: [facility] OR;  Service: Open Heart Surgery;  Laterality: N/A;   APPENDECTOMY     taken out with hysterectomy   BLADDER SURGERY     Lift    CARDIAC CATHETERIZATION     COLONOSCOPY  2015   CORONARY ARTERY BYPASS GRAFT N/A 09/24/2018   Procedure: CORONARY ARTERY BYPASS GRAFTING (CABG) x 2; (LIMA to LAD, SVG to PDA) using RIGHT THIGH GREATER SAPHENOUS VEIN GRAFT (SVG) and LEFT INTERNAL MAMMARY ARTERY (LIMA): LIMA to LAD, SVG to PDA ;  Surgeon: Loreli Slot, MD;  Location: [facility] OR;  Service: Open Heart Surgery;  Laterality: N/A;   EYE SURGERY Bilateral    cataract surgery with lens implants   lithotripsy   within last 10 years   OOPHORECTOMY     PARTIAL HYSTERECTOMY     RIGHT/LEFT HEART CATH AND CORONARY ANGIOGRAPHY N/A 09/10/2018   Procedure: RIGHT/LEFT HEART CATH AND CORONARY ANGIOGRAPHY;  Surgeon: Runell Gess, MD;  Location: [facility] INVASIVE CV LAB;  Service: Cardiovascular;  Laterality: N/A;   TEE WITHOUT CARDIOVERSION N/A 09/24/2018   Procedure: TRANSESOPHAGEAL ECHOCARDIOGRAM (TEE);  Surgeon: Loreli Slot, MD;  Location: [facility] OR;  Service: Open Heart Surgery;  Laterality:  N/A;   TOTAL KNEE ARTHROPLASTY Right 07/24/2017   Procedure: RIGHT TOTAL KNEE ARTHROPLASTY;  Surgeon: Ollen Gross, MD;  Location: [facility] ORS;  Service: Orthopedics;  Laterality: Right;  Adductor Block    FAMHx:  Family History  Problem Relation Age of Onset   Heart disease Mother    Heart disease Father    Colon cancer Neg Hx    Colon polyps Neg Hx    Esophageal cancer Neg Hx    Rectal cancer Neg Hx    Stomach cancer Neg Hx     SOCHx:   reports that she quit smoking about 35 years ago. Her smoking use included cigarettes. She started smoking about 55 years ago.  She has a 16 pack-year smoking history. She has never used smokeless tobacco. She reports that she does not drink alcohol and does not use drugs.  ALLERGIES:  Allergies  Allergen Reactions   Keflex [Cephalexin] Itching   Repatha [Evolocumab]     Migraine, IBS flaire up, and hair loss   Icosapent Ethyl Other (See Comments)      patient states gives  "gas"   Rosuvastatin Other (See Comments)    Muscle aches   Amoxicillin Rash    Has patient had a PCN reaction causing immediate rash, facial/tongue/throat swelling, SOB or lightheadedness with hypotension: No Has patient had a PCN reaction causing severe rash involving mucus membranes or skin necrosis: No Has patient had a PCN reaction that required hospitalization: No Has patient had a PCN reaction occurring within the last 10 years: No If all of the above answers are "NO", then may proceed with Cephalosporin use.     Amoxicillin-Pot Clavulanate Rash    Has patient had a PCN reaction causing immediate rash, facial/tongue/throat swelling, SOB or lightheadedness with hypotension: No Has patient had a PCN reaction causing severe rash involving mucus membranes or skin necrosis: No Has patient had a PCN reaction that required hospitalization: No Has patient had a PCN reaction occurring within the last 10 years: No If all of the above answers are "NO", then may proceed with  Cephalosporin use.    Ampicillin Rash   Clarithromycin Rash   Clopidogrel Bisulfate Rash   Nitrofurantoin Itching   Plavix [Clopidogrel Bisulfate] Rash   Zestril [Lisinopril] Rash    MEDS:  Current Meds  Medication Sig   Alirocumab 150 MG/ML SOAJ Inject 1 Dose into the skin every 14 (fourteen) days.   allopurinol (ZYLOPRIM) 100 MG tablet Take 1 tablet (100 mg total) by mouth daily.   amLODipine (NORVASC) 10 MG tablet Take 5 mg by mouth daily.   aspirin 81 MG EC tablet Take 1 tablet (81 mg total) by mouth daily.   azithromycin (ZITHROMAX) 500 MG tablet Take 1 tablet (500 mg total) by mouth as needed (for dental procedures). Take 1 hour prior to dental procedure.   Cholecalciferol (VITAMIN D) 2000 units tablet Take 1 tablet by mouth daily.   diclofenac Sodium (VOLTAREN) 1 % GEL Apply topically.   dicyclomine (BENTYL) 20 MG tablet Take 20 mg by mouth 2 (two) times daily as needed for spasms.   hydrochlorothiazide (MICROZIDE) 12.5 MG capsule Take 1 capsule by mouth once daily   levothyroxine (SYNTHROID) 100 MCG tablet Take 100 mcg by mouth daily before breakfast.   losartan (COZAAR) 50 MG tablet Take 1 tablet (50 mg total) by mouth daily.   meclizine (ANTIVERT) 25 MG tablet Take 25 mg by mouth 3 (three) times daily as needed for dizziness.   metoprolol tartrate (LOPRESSOR) 25 MG tablet TAKE 1 TABLET BY MOUTH TWICE DAILY . APPOINTMENT REQUIRED FOR FUTURE REFILLS   traMADol (ULTRAM) 50 MG tablet Take 50 mg by mouth every 4-6 hours PRN severe pain   [DISCONTINUED] amLODipine (NORVASC) 5 MG tablet Take 5 mg by mouth daily.     ROS: Pertinent items noted in HPI and remainder of comprehensive ROS otherwise negative.  Labs/Other Tests and Data Reviewed:    Recent Labs: No results found for requested labs within last 365 days.   Recent Lipid Panel Lab Results  Component Value Date/Time   CHOL 171 01/05/2023 08:36 AM   TRIG 279 (H) 01/05/2023 08:36 AM   HDL 45 01/05/2023 08:36 AM  CHOLHDL 3.8 01/05/2023 08:36 AM   LDLCALC 80 01/05/2023 08:36 AM    Wt Readings from Last 3 Encounters:  05/24/23 220 lb 12.8 oz (100.2 kg)  01/10/23 215 lb (97.5 kg)  05/11/22 226 lb 6.4 oz (102.7 kg)     Exam:    Vital Signs:  Ht 5\' 5"  (1.651 m)   BMI 36.74 kg/m    Deferred d/t telephone visit  ASSESSMENT & PLAN:    Mixed dyslipidemia, goal LDL less than 70 Relative statin intolerance-myalgias Repatha intolerant Coronary artery disease status post CABG x2 Aortic stenosis status post AVR IBS with recurrent symptoms  Ms. Damas has had an increase in her lipids, but overall control is not bad on monotherapy Praluent. I advised some dietary modification she can make to help her numbers and encouraged physical activity. Will plan in office follow-up in 6 months with repeat lipid NMR and Vit D prior to that.  COVID-19 Education: The signs and symptoms of COVID-19 were discussed with the patient and how to seek care for testing (follow up with PCP or arrange E-visit).  The importance of social distancing was discussed today.  Patient Risk:   After full review of this patients clinical status, I feel that they are at least moderate risk at this time.  Time:   Today, I have spent 15 minutes with the patient with telehealth technology discussing dyslipidemia, PCSK9 inhibitors.     Medication Adjustments/Labs and Tests Ordered: Current medicines are reviewed at length with the patient today.  Concerns regarding medicines are outlined above.   Tests Ordered: No orders of the defined types were placed in this encounter.    Medication Changes: No orders of the defined types were placed in this encounter.    Disposition:  in 6 month(s)  Chrystie Nose, MD, [facility], FACP  [place]  [facility]  Medical Director of the Advanced Lipid Disorders &  Cardiovascular Risk Reduction Clinic Diplomate of the American Board of Clinical Lipidology Attending Cardiologist   Direct Dial: [phone]  Fax: [phone]  Website:  [email or web address]  Chrystie Nose, MD  07/12/2023 9:03 AM

## 2023-07-12 NOTE — Patient Instructions (Signed)
Medication Instructions:  NO CHANGES  *If you need a refill on your cardiac medications before your next appointment, please call your pharmacy*   Lab Work: FASTING lab work to check cholesterol and vit D in 6 months  If you have labs (blood work) drawn today and your tests are completely normal, you will receive your results only by: MyChart Message (if you have MyChart) OR A paper copy in the mail If you have any lab test that is abnormal or we need to change your treatment, we will call you to review the results.    Follow-Up: At [facility], you and your health needs are our priority.  As part of our continuing mission to provide you with exceptional heart care, we have created designated Provider Care Teams.  These Care Teams include your primary Cardiologist (physician) and Advanced Practice Providers (APPs -  Physician Assistants and Nurse Practitioners) who all work together to provide you with the care you need, when you need it.  We recommend signing up for the patient portal called "MyChart".  Sign up information is provided on this After Visit Summary.  MyChart is used to connect with patients for Virtual Visits (Telemedicine).  Patients are able to view lab/test results, encounter notes, upcoming appointments, etc.  Non-urgent messages can be sent to your provider as well.   To learn more about what you can do with MyChart, go to [email or web address].    Your next appointment:    6 months in office with Dr. Rennis Golden  ** call in September or October for an appointment

## 2023-07-12 NOTE — Telephone Encounter (Signed)
Patient stated that we need to call her on her house phone [phone]. I stated its a video visit and needed to be on her cell phone. Patient then stated we always call her on house phone. Please advise

## 2023-07-12 NOTE — Telephone Encounter (Signed)
Patient is requesting call back to go over past results. Please advise.

## 2023-07-12 NOTE — Telephone Encounter (Signed)
Contacted patient for virtual visit. MD is aware

## 2023-07-19 LAB — COLOGUARD: COLOGUARD: NEGATIVE

## 2023-09-08 ENCOUNTER — Other Ambulatory Visit: Payer: Self-pay | Admitting: Cardiovascular Disease

## 2023-09-26 ENCOUNTER — Telehealth: Payer: Self-pay | Admitting: Internal Medicine

## 2023-09-26 MED ORDER — AZITHROMYCIN 500 MG PO TABS: 500.0000 mg | ORAL_TABLET | ORAL | 4 refills | Status: DC | PRN

## 2023-09-26 NOTE — Telephone Encounter (Signed)
*  STAT* If patient is at the pharmacy, call can be transferred to refill team.   1. Which medications need to be refilled? (please list name of each medication and dose if known) azithromycin (ZITHROMAX) 500 MG tablet  2. Which pharmacy/location (including street and city if local pharmacy) is medication to be sent to? [facility] - [place], [place] - [address]  3. Do they need a 30 day or 90 day supply?   Patient states she has dental work scheduled for tomorrow, 10/02 at 12:00 PM and she normally has Azithromycin prescribed prior.

## 2023-09-26 NOTE — Telephone Encounter (Signed)
Pt's medication was sent to pt's pharmacy as requested. Confirmation received.  °

## 2023-11-27 ENCOUNTER — Telehealth: Payer: Self-pay | Admitting: Internal Medicine

## 2023-11-27 NOTE — Telephone Encounter (Signed)
Patient calling the office for samples of medication:   1.  What medication and dosage are you requesting samples for?   Alirocumab 150 MG/ML SOAJ    2.  Are you currently out of this medication? Yes, pt states she is in the donut hole and did her last dose already. Please advise.

## 2023-11-27 NOTE — Telephone Encounter (Signed)
Patient is returning call. Requesting call back 

## 2023-11-28 NOTE — Telephone Encounter (Signed)
There are no samples of Praluent available. Coverage resets in January and her copay will come down.

## 2023-12-04 ENCOUNTER — Other Ambulatory Visit: Payer: Self-pay | Admitting: Cardiovascular Disease

## 2023-12-15 ENCOUNTER — Encounter: Payer: Self-pay | Admitting: Internal Medicine

## 2023-12-15 ENCOUNTER — Ambulatory Visit: Payer: PPO | Attending: Internal Medicine | Admitting: Internal Medicine

## 2023-12-15 VITALS — BP 124/84 | HR 80 | Ht 65.0 in | Wt 216.6 lb

## 2023-12-15 DIAGNOSIS — M791 Myalgia, unspecified site: Secondary | ICD-10-CM | POA: Diagnosis not present

## 2023-12-15 DIAGNOSIS — E785 Hyperlipidemia, unspecified: Secondary | ICD-10-CM

## 2023-12-15 DIAGNOSIS — Z952 Presence of prosthetic heart valve: Secondary | ICD-10-CM

## 2023-12-15 DIAGNOSIS — Z951 Presence of aortocoronary bypass graft: Secondary | ICD-10-CM

## 2023-12-15 DIAGNOSIS — T466X5D Adverse effect of antihyperlipidemic and antiarteriosclerotic drugs, subsequent encounter: Secondary | ICD-10-CM

## 2023-12-15 LAB — NMR, LIPOPROFILE
Cholesterol, Total: 138 mg/dL (ref 100–199)
HDL Particle Number: 35 umol/L (ref >=30.5)
HDL-C: 51 mg/dL (ref >39)
LDL Particle Number: 889 nmol/L (ref <1000)
LDL Size: 20.4 nmol — ABNORMAL LOW (ref >20.5)
LDL-C (NIH Calc): 61 mg/dL (ref 0–99)
LP-IR Score: 56 — ABNORMAL HIGH (ref <=45)
Small LDL Particle Number: 393 nmol/L (ref <=527)
Triglycerides: 150 mg/dL — ABNORMAL HIGH (ref 0–149)

## 2023-12-15 LAB — VITAMIN D 25 HYDROXY (VIT D DEFICIENCY, FRACTURES): Vit D, 25-Hydroxy: 52.9 ng/mL (ref 30.0–100.0)

## 2023-12-15 NOTE — Patient Instructions (Signed)
Medication Instructions:  NO CHANGES  *If you need a refill on your cardiac medications before your next appointment, please call your pharmacy*   Lab Work: FASTING lab work in 6 months -- before next visit  If you have labs (blood work) drawn today and your tests are completely normal, you will receive your results only by: MyChart Message (if you have MyChart) OR A paper copy in the mail If you have any lab test that is abnormal or we need to change your treatment, we will call you to review the results.    Follow-Up: At [facility], you and your health needs are our priority.  As part of our continuing mission to provide you with exceptional heart care, we have created designated Provider Care Teams.  These Care Teams include your primary Cardiologist (physician) and Advanced Practice Providers (APPs -  Physician Assistants and Nurse Practitioners) who all work together to provide you with the care you need, when you need it.  We recommend signing up for the patient portal called "MyChart".  Sign up information is provided on this After Visit Summary.  MyChart is used to connect with patients for Virtual Visits (Telemedicine).  Patients are able to view lab/test results, encounter notes, upcoming appointments, etc.  Non-urgent messages can be sent to your provider as well.   To learn more about what you can do with MyChart, go to [email or web address].    Your next appointment:   6 months with Dr. Rennis Golden

## 2023-12-15 NOTE — Progress Notes (Signed)
LIPID CLINIC CONSULT NOTE  Chief Complaint:  Follow-up dyslipidemia  Primary Care Physician: Shellia Cleverly, [place]  Primary Cardiologist:  Nanetta Batty, MD  HPI:  [name] [name] is a 77 y.o. female who is being seen today for the evaluation of dyslipidemia at the request of Nanetta Batty, MD. [name] [name] is a 77 y.o. female who presents via audio/video conferencing for a telehealth visit today.  This is a pleasant 77 year old female with longstanding dyslipidemia.  She has a history of high LDL cholesterol as well as high triglycerides, once over 800.  More recently she had progressive aortic valve disease and developed severe aortic stenosis and was found to have two-vessel coronary disease.  She underwent CABG x2, with LIMA to LAD and SVG to PDA and replacement of the aortic valve.  She has been relatively intolerant to statins but currently is on low-dose rosuvastatin 10 mg twice weekly.  She said even with this she noted some worsening of her muscle aches and joint pain.  She also has some advanced arthritis.  After stopping the statin for about a week she noted some improvement.  Previously she had been on Repatha with marked improvement in her numbers and achieved an LDL of 42, however she did report side effects including migraine headaches, worsening of IBS symptoms and hair loss which she said improved significantly after stopping the injections 4 months.  She came off of the Repatha back in December and recently had repeat labs in March 2021, that demonstrated total cholesterol 177, triglycerides 225, HDL 45 and LDL 94.  This represents therapy on rosuvastatin 10 mg twice weekly.  She reports diet has been stable if not become somewhat healthier as she has gotten older.  08/13/2020  [name] [name] returns today for office follow-up.  I last seen her virtually.  Plan was to adjust her statin regimen due to side effects and I recommended taking rosuvastatin 5 mg every Monday Wednesday and  Friday and adding ezetimibe 10 mg daily to her regimen.  This has resulted in overall reduction in her lipids.  Total cholesterol now 165, triglycerides 223, HDL 40 and LDL 87.  Her hemoglobin A1c is 5.9.  She still remains above target LDL and her triglycerides are higher than ideal.  We discussed other possible options including adding Vascepa.  She seemed interested but was concerned about issues as particular swallowing pills given her history of IBS and other side effects.   02/18/2021  [name] [name] returns for follow-up.  Unfortunately she could not tolerate Repatha, rosuvastatin or Vascepa due to side effects. Cholesterol is much higher. Total is up to 271 from 165 and her LDL is up to 169 from 87.  She will need additional therapies.  We discussed the possibility of Praluent which although is very similar can be tolerated slightly different.  This may be an option for her.  We could also consider evaluation for inclisiran.   12/15/2023  [name] [name] returns today for follow-up.  Overall she seems to be doing pretty well.  She had lipids back in September which showed total cholesterol 186, triglycerides 277, HDL 50 and LDL 96.  Her LDL has been creeping up somewhat.  Vitamin D levels are normal at 52.9.  She discussed this with her PCP and decided she wanted to add in Zetia again.  She thought she might have some side effects with her irritable bowel syndrome, but seems to be tolerated.  She did have repeat lipid testing  yesterday however that has not yet resulted.  Overall she seems to be tolerating the combination therapy.  PMHx:  Past Medical History:  Diagnosis Date   Aortic stenosis, severe 08/2018   progressed from moderate by ECHO 01-03-17    Arthritis    Cancer (HCC)    basal cell skin cancer   Carotid artery bruit    Cataract    bilateral repaired   Claustrophobia    Coronary artery disease    Diverticulosis    Family history of heart disease    GERD (gastroesophageal reflux disease)     Gout    Heart murmur    repaired   History of hiatal hernia    History of kidney stones    Hyperlipemia    Hypertension    Irritable bowel syndrome    Stroke [facility])    old- 25 yrs ago found on a test.   Thyroid disease    UTI (urinary tract infection) 03/09/14    Past Surgical History:  Procedure Laterality Date   AORTIC VALVE REPLACEMENT N/A 09/24/2018   Procedure: AORTIC VALVE REPLACEMENT (AVR) using  an INSPIRIS RESILIA  Prosthetic Valve (Model #: 11500A, Size: , Serial #: [id number]);  Surgeon: Loreli Slot, MD;  Location: [facility] OR;  Service: Open Heart Surgery;  Laterality: N/A;   APPENDECTOMY     taken out with hysterectomy   BLADDER SURGERY     Lift    CARDIAC CATHETERIZATION     COLONOSCOPY  2015   CORONARY ARTERY BYPASS GRAFT N/A 09/24/2018   Procedure: CORONARY ARTERY BYPASS GRAFTING (CABG) x 2; (LIMA to LAD, SVG to PDA) using RIGHT THIGH GREATER SAPHENOUS VEIN GRAFT (SVG) and LEFT INTERNAL MAMMARY ARTERY (LIMA): LIMA to LAD, SVG to PDA ;  Surgeon: Loreli Slot, MD;  Location: [facility] OR;  Service: Open Heart Surgery;  Laterality: N/A;   EYE SURGERY Bilateral    cataract surgery with lens implants   lithotripsy   within last 10 years   OOPHORECTOMY     PARTIAL HYSTERECTOMY     RIGHT/LEFT HEART CATH AND CORONARY ANGIOGRAPHY N/A 09/10/2018   Procedure: RIGHT/LEFT HEART CATH AND CORONARY ANGIOGRAPHY;  Surgeon: Runell Gess, MD;  Location: [facility] INVASIVE CV LAB;  Service: Cardiovascular;  Laterality: N/A;   TEE WITHOUT CARDIOVERSION N/A 09/24/2018   Procedure: TRANSESOPHAGEAL ECHOCARDIOGRAM (TEE);  Surgeon: Loreli Slot, MD;  Location: [facility] OR;  Service: Open Heart Surgery;  Laterality: N/A;   TOTAL KNEE ARTHROPLASTY Right 07/24/2017   Procedure: RIGHT TOTAL KNEE ARTHROPLASTY;  Surgeon: Ollen Gross, MD;  Location: [facility] ORS;  Service: Orthopedics;  Laterality: Right;  Adductor Block    FAMHx:  Family History  Problem Relation Age of Onset   Heart  disease Mother    Heart disease Father    Colon cancer Neg Hx    Colon polyps Neg Hx    Esophageal cancer Neg Hx    Rectal cancer Neg Hx    Stomach cancer Neg Hx     SOCHx:   reports that she quit smoking about 36 years ago. Her smoking use included cigarettes. She started smoking about 56 years ago. She has a 16 pack-year smoking history. She has never used smokeless tobacco. She reports that she does not drink alcohol and does not use drugs.  ALLERGIES:  Allergies  Allergen Reactions   Keflex [Cephalexin] Itching   Repatha [Evolocumab]     Migraine, IBS flaire up, and hair loss   Icosapent Ethyl (Epa Ethyl  Ester) (Fish) Other (See Comments)      patient states gives  "gas"   Rosuvastatin Other (See Comments)    Muscle aches   Amoxicillin Rash    Has patient had a PCN reaction causing immediate rash, facial/tongue/throat swelling, SOB or lightheadedness with hypotension: No Has patient had a PCN reaction causing severe rash involving mucus membranes or skin necrosis: No Has patient had a PCN reaction that required hospitalization: No Has patient had a PCN reaction occurring within the last 10 years: No If all of the above answers are "NO", then may proceed with Cephalosporin use.     Amoxicillin-Pot Clavulanate Rash    Has patient had a PCN reaction causing immediate rash, facial/tongue/throat swelling, SOB or lightheadedness with hypotension: No Has patient had a PCN reaction causing severe rash involving mucus membranes or skin necrosis: No Has patient had a PCN reaction that required hospitalization: No Has patient had a PCN reaction occurring within the last 10 years: No If all of the above answers are "NO", then may proceed with Cephalosporin use.    Ampicillin Rash   Clarithromycin Rash   Clopidogrel Bisulfate Rash   Nitrofurantoin Itching   Plavix [Clopidogrel Bisulfate] Rash   Zestril [Lisinopril] Rash    ROS: Pertinent items noted in HPI and remainder of  comprehensive ROS otherwise negative.  HOME MEDS: Current Outpatient Medications on File Prior to Visit  Medication Sig Dispense Refill   Alirocumab 150 MG/ML SOAJ Inject 1 Dose into the skin every 14 (fourteen) days. 6 mL 3   allopurinol (ZYLOPRIM) 100 MG tablet Take 1 tablet (100 mg total) by mouth daily. 30 tablet 0   amLODipine (NORVASC) 10 MG tablet Take 5 mg by mouth daily.     aspirin 81 MG EC tablet Take 1 tablet (81 mg total) by mouth daily.     azithromycin (ZITHROMAX) 500 MG tablet Take 1 tablet (500 mg total) by mouth as needed (for dental procedures). Take 1 hour prior to dental procedure. 1 tablet 4   Cholecalciferol (VITAMIN D) 2000 units tablet Take 1 tablet by mouth daily.     dicyclomine (BENTYL) 20 MG tablet Take 20 mg by mouth 2 (two) times daily as needed for spasms.     hydrochlorothiazide (MICROZIDE) 12.5 MG capsule Take 1 capsule by mouth once daily 90 capsule 1   levothyroxine (SYNTHROID) 100 MCG tablet Take 100 mcg by mouth daily before breakfast.     losartan (COZAAR) 50 MG tablet Take 1 tablet (50 mg total) by mouth daily. 90 tablet 0   meclizine (ANTIVERT) 25 MG tablet Take 25 mg by mouth 3 (three) times daily as needed for dizziness.     metoprolol tartrate (LOPRESSOR) 25 MG tablet TAKE 1 TABLET BY MOUTH TWICE DAILY . APPOINTMENT REQUIRED FOR FUTURE REFILLS 180 tablet 0   traMADol (ULTRAM) 50 MG tablet Take 50 mg by mouth every 4-6 hours PRN severe pain 30 tablet 0   diclofenac Sodium (VOLTAREN) 1 % GEL Apply topically. (Patient not taking: Reported on 12/15/2023)     No current facility-administered medications on file prior to visit.    LABS/IMAGING: Results for orders placed or performed in visit on 07/12/23 (from the past 48 hours)  Vitamin D (25 hydroxy)     Status: None   Collection Time: 12/14/23  9:43 AM  Result Value Ref Range   Vit D, 25-Hydroxy 52.9 30.0 - 100.0 ng/mL    Comment: Vitamin D deficiency has been defined by the Institute of  Medicine  and an Endocrine Society practice guideline as a level of serum 25-OH vitamin D less than 20 ng/mL (1,2). The Endocrine Society went on to further define vitamin D insufficiency as a level between 21 and 29 ng/mL (2). 1. IOM (Institute of Medicine). 2010. Dietary reference    intakes for calcium and D. [place]: The    [employer]. 2. Holick MF, Binkley Bourbon, Bischoff-Ferrari HA, et al.    Evaluation, treatment, and prevention of vitamin D    deficiency: an Endocrine Society clinical practice    guideline. JCEM. 2011 Jul; 96(7):1911-30.    No results found.  LIPID PANEL:    Component Value Date/Time   CHOL 171 01/05/2023 0836   TRIG 279 (H) 01/05/2023 0836   HDL 45 01/05/2023 0836   CHOLHDL 3.8 01/05/2023 0836   LDLCALC 80 01/05/2023 0836    WEIGHTS: Wt Readings from Last 3 Encounters:  12/15/23 216 lb 9.6 oz (98.2 kg)  05/24/23 220 lb 12.8 oz (100.2 kg)  01/10/23 215 lb (97.5 kg)    VITALS: BP 124/84 (BP Location: Right Arm, Patient Position: Sitting, Cuff Size: Normal)   Pulse 80   Ht 5\' 5"  (1.651 m)   Wt 216 lb 9.6 oz (98.2 kg)   SpO2 98%   BMI 36.04 kg/m   EXAM: Deferred  EKG: Deferred  ASSESSMENT: Mixed dyslipidemia, goal LDL less than 70 Relative statin intolerance-myalgias Repatha intolerant Coronary artery disease status post CABG x2 Aortic stenosis status post AVR  PLAN: 1.   [name] [name] has had an increase in her lipids over the past 6 months.  She started herself back on Zetia after talking with her primary care provider.  Labs were drawn yesterday but have not yet resulted.  Hopefully this will help additionally with her cholesterol.  I encouraged her to continue to work on exercise and dietary modifications to lower saturated fats.  Plan follow-up in 6 months with repeat lipid testing.  Chrystie Nose, MD, [facility], FACP  [place]  [facility]  Medical Director of the Advanced Lipid Disorders &  Cardiovascular Risk Reduction  Clinic Diplomate of the American Board of Clinical Lipidology Attending Cardiologist  Direct Dial: [phone]  Fax: [phone]  Website:  [email or web address] Nicely Yariah Selvey 12/15/2023, 9:12 AM

## 2023-12-21 ENCOUNTER — Telehealth: Payer: Self-pay | Admitting: Internal Medicine

## 2023-12-21 NOTE — Telephone Encounter (Signed)
Patient is calling to follow up on lab results. Please advise 

## 2023-12-21 NOTE — Telephone Encounter (Signed)
Called and spoke to patient. Verified name and DOB. She's calling to get lab results done on 12/19. Informed her results are back but has not been reviewed by the provider. Will send to provider for review. Please advise.

## 2023-12-25 NOTE — Telephone Encounter (Signed)
Results sent to MyChart

## 2023-12-28 ENCOUNTER — Telehealth: Payer: Self-pay | Admitting: Internal Medicine

## 2023-12-28 NOTE — Telephone Encounter (Signed)
 Pt calling to speak with nurse about her results. Please advise

## 2023-12-28 NOTE — Telephone Encounter (Signed)
 Called and spoke to patient. Verified name and DOB. Below message relayed per Dr Rennis Golden.    Chrystie Nose, MD 12/24/2023  2:01 PM EST   Lipids are improved - Vit D level is normal.

## 2024-01-02 ENCOUNTER — Other Ambulatory Visit (HOSPITAL_COMMUNITY): Payer: Self-pay

## 2024-01-02 ENCOUNTER — Other Ambulatory Visit: Payer: Self-pay

## 2024-01-02 MED ORDER — ALIROCUMAB 150 MG/ML ~~LOC~~ SOAJ
150.0000 mg | SUBCUTANEOUS | 3 refills | Status: AC
  Filled 2024-01-02: qty 6, 84d supply, fill #0

## 2024-01-03 ENCOUNTER — Telehealth: Payer: Self-pay | Admitting: Internal Medicine

## 2024-01-03 ENCOUNTER — Other Ambulatory Visit (HOSPITAL_COMMUNITY): Payer: Self-pay

## 2024-01-03 MED ORDER — ALIROCUMAB 150 MG/ML ~~LOC~~ SOAJ: 150.0000 mg | SUBCUTANEOUS | 3 refills | Status: AC

## 2024-01-03 NOTE — Telephone Encounter (Signed)
 Pt c/o medication issue:  1. Name of Medication: Alirocumab  150 MG/ML SOAJ   2. How are you currently taking this medication (dosage and times per day)?   3. Are you having a reaction (difficulty breathing--STAT)?   4. What is your medication issue?  Patient would like for this prescript to go to [facility] - [place], [place] - [address], it was sent to [facility] pharmacy.

## 2024-01-03 NOTE — Telephone Encounter (Signed)
 Patient identification verified by 2 forms. Bertina Cooks, RN    Called and spoke to patient  Informed patient Rx sent to [facility] pharmacy  Informed patient she can confirm with [facility] pharmacy regarding cost/pick up  Patient verbalized understanding, no questions at this time

## 2024-02-28 ENCOUNTER — Telehealth: Payer: Self-pay | Admitting: Cardiovascular Disease

## 2024-02-28 MED ORDER — AZITHROMYCIN 500 MG PO TABS: 500.0000 mg | ORAL_TABLET | ORAL | 4 refills | Status: DC | PRN

## 2024-02-28 NOTE — Telephone Encounter (Signed)
 Pt's medication was sent to pt's pharmacy as requested. Confirmation received.

## 2024-02-28 NOTE — Telephone Encounter (Signed)
*  STAT* If patient is at the pharmacy, call can be transferred to refill team.   1. Which medications need to be refilled? (please list name of each medication and dose if known)  Azithromycin 500 mg- need this today before she goes to the dentist tomorrow   2. Would you like to learn more about the convenience, safety, & potential cost savings by using the [facility]?   3. Are you open to using the [facility] (Type [facility].    4. Which pharmacy/location (including street and city if local pharmacy) is medication to be sent to?[facility]     5. Do they need a 30 day or 90 day supply? # 4  please call in today

## 2024-04-01 ENCOUNTER — Other Ambulatory Visit: Payer: Self-pay | Admitting: Cardiovascular Disease

## 2024-04-01 DIAGNOSIS — I359 Nonrheumatic aortic valve disorder, unspecified: Secondary | ICD-10-CM

## 2024-04-01 DIAGNOSIS — I10 Essential (primary) hypertension: Secondary | ICD-10-CM

## 2024-04-01 DIAGNOSIS — E7800 Pure hypercholesterolemia, unspecified: Secondary | ICD-10-CM

## 2024-04-01 DIAGNOSIS — I7121 Aneurysm of the ascending aorta, without rupture: Secondary | ICD-10-CM

## 2024-04-01 DIAGNOSIS — Z953 Presence of xenogenic heart valve: Secondary | ICD-10-CM

## 2024-04-18 ENCOUNTER — Ambulatory Visit (HOSPITAL_BASED_OUTPATIENT_CLINIC_OR_DEPARTMENT_OTHER)

## 2024-04-18 ENCOUNTER — Ambulatory Visit (HOSPITAL_BASED_OUTPATIENT_CLINIC_OR_DEPARTMENT_OTHER)
Admission: RE | Admit: 2024-04-18 | Discharge: 2024-04-18 | Disposition: A | Discharge status: Home / Self Care | Source: Ambulatory Visit | Attending: Cardiovascular Disease | Admitting: Cardiovascular Disease

## 2024-04-18 DIAGNOSIS — I7121 Aneurysm of the ascending aorta, without rupture: Secondary | ICD-10-CM | POA: Insufficient documentation

## 2024-04-18 DIAGNOSIS — I359 Nonrheumatic aortic valve disorder, unspecified: Secondary | ICD-10-CM | POA: Diagnosis not present

## 2024-04-18 LAB — ECHOCARDIOGRAM COMPLETE
AR max vel: 1.87 cm2
AV Area VTI: 1.82 cm2
AV Area mean vel: 1.85 cm2
AV Mean grad: 8.5 mmHg
AV Peak grad: 13.5 mmHg
Ao pk vel: 1.84 m/s
Area-P 1/2: 4.63 cm2
Calc EF: 64 %
MV M vel: 4.78 m/s
MV Peak grad: 91.4 mmHg
S' Lateral: 2.7 cm
Single Plane A2C EF: 61 %
Single Plane A4C EF: 68 %

## 2024-04-22 ENCOUNTER — Other Ambulatory Visit (HOSPITAL_COMMUNITY)

## 2024-05-07 ENCOUNTER — Encounter: Payer: Self-pay | Admitting: Cardiovascular Disease

## 2024-05-07 ENCOUNTER — Ambulatory Visit: Attending: Cardiovascular Disease | Admitting: Cardiovascular Disease

## 2024-05-07 VITALS — BP 120/70 | HR 77 | Ht 65.0 in | Wt 217.0 lb

## 2024-05-07 DIAGNOSIS — Z952 Presence of prosthetic heart valve: Secondary | ICD-10-CM

## 2024-05-07 DIAGNOSIS — Z951 Presence of aortocoronary bypass graft: Secondary | ICD-10-CM

## 2024-05-07 DIAGNOSIS — I10 Essential (primary) hypertension: Secondary | ICD-10-CM | POA: Diagnosis not present

## 2024-05-07 DIAGNOSIS — I7121 Aneurysm of the ascending aorta, without rupture: Secondary | ICD-10-CM

## 2024-05-07 DIAGNOSIS — Z953 Presence of xenogenic heart valve: Secondary | ICD-10-CM

## 2024-05-07 NOTE — Patient Instructions (Signed)
 Medication Instructions:  Your physician recommends that you continue on your current medications as directed. Please refer to the Current Medication list given to you today.    *If you need a refill on your cardiac medications before your next appointment, please call your pharmacy*   Lab Work: None    If you have labs (blood work) drawn today and your tests are completely normal, you will receive your results only by: MyChart Message (if you have MyChart) OR A paper copy in the mail If you have any lab test that is abnormal or we need to change your treatment, we will call you to review the results.   Testing/Procedures: Your physician has requested that you have an echocardiogram IN 1 YEAR BEFORE NEXT APPOINTMENT.  Echocardiography is a painless test that uses sound waves to create images of your heart. It provides your doctor with information about the size and shape of your heart and how well your heart's chambers and valves are working. This procedure takes approximately one hour. There are no restrictions for this procedure. Please do NOT wear cologne, perfume, aftershave, or lotions (deodorant is allowed). Please arrive 15 minutes prior to your appointment time.  Please note: We ask at that you not bring children with you during ultrasound (echo/ vascular) testing. Due to room size and safety concerns, children are not allowed in the ultrasound rooms during exams. Our front office staff cannot provide observation of children in our lobby area while testing is being conducted. An adult accompanying a patient to their appointment will only be allowed in the ultrasound room at the discretion of the ultrasound technician under special circumstances. We apologize for any inconvenience.    Follow-Up: At [facility], you and your health needs are our priority.  As part of our continuing mission to provide you with exceptional heart care, we have created designated Provider Care Teams.   These Care Teams include your primary Cardiologist (physician) and Advanced Practice Providers (APPs -  Physician Assistants and Nurse Practitioners) who all work together to provide you with the care you need, when you need it.  We recommend signing up for the patient portal called "MyChart".  Sign up information is provided on this After Visit Summary.  MyChart is used to connect with patients for Virtual Visits (Telemedicine).  Patients are able to view lab/test results, encounter notes, upcoming appointments, etc.  Non-urgent messages can be sent to your provider as well.   To learn more about what you can do with MyChart, go to [email or web address].    Your next appointment:   1 year(s)  The format for your next appointment:   In Person  Provider:   Lauro Portal, MD   Other Instructions

## 2024-05-07 NOTE — Assessment & Plan Note (Signed)
 History of hyperlipidemia on Praluent  and Zetia  followed by Dr. Maximo Spar in the lipid clinic with recent lipid profile performed 12/14/2023 revealing total cholesterol 138, LDL 61 and HDL of 51.

## 2024-05-07 NOTE — Assessment & Plan Note (Signed)
 Status post aortic valve replacement with a 23 mm Inspira's valve by Dr. Teofilo Fellers September 2019 at the time of CABG x 2.  2D echo performed/20/25 revealed normal LV systolic function, grade 2 diastolic dysfunction and a well-functioning aortic bioprosthesis.

## 2024-05-07 NOTE — Assessment & Plan Note (Signed)
 CTA performed 02/14/2021 revealed her ascending aorta measured 38 mm.

## 2024-05-07 NOTE — Assessment & Plan Note (Signed)
 History of CABG x 2 and AVR for severe aortic stenosis by Dr. Luna Salinas September 2019 after cardiac cath performed by myself 09/10/2018 revealed a total mid RCA with high-grade proximal LAD disease and severe aortic stenosis.  She had a pericardial tissue valve replaced as well for severe aortic stenosis.  She denies chest pain.

## 2024-05-07 NOTE — Assessment & Plan Note (Signed)
 History of essential hypertension her blood pressure measured today 120/70.  She is on metoprolol , losartan  and hydrochlorothiazide .

## 2024-05-07 NOTE — Progress Notes (Signed)
 05/07/2024 [name] [name]   1946/12/15  [id number]  Primary Physician Bentley Bray, PA Primary Cardiologist: Avanell Leigh MD FACP, [place], [place], [place]  HPI:  [name] [name] is a 78 y.o.    mildly overweight married Caucasian female mother of 3 children, grandmother to 6 grandchildren who I last saw in the office 05/24/2023.  She is accompanied by one of her daughters [name] today.  She was self-referred to be established in our practice, a friend of hers is my patient. 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 Demaris Fillers. Her cardiovascular risk factor profile is notable for family history of heart disease with both parents died of myocardial infarctions in their 57s and 80s and a brother who died of an MI at 2. She has never had a heart attack or a clinical stroke. Risk factors otherwise remarkable for treated hypertension and hyperlipidemia. She does have GERD.  She had negative nuclear stress test and 2-D echocardiogram at [facility] 3 years ago. She does have a history of a murmur and had a recent 2-D echo performed 01/30/18 revealing progression of her aortic stenosis now with a peak gradient of 70 and a valve area 1.17 cm. She does complain of some dyspnea on exertion. She denies chest pain. Because of symptoms of dyspnea as well as a 2D echo that revealed severe lAS.I performed outpatient cardiac cath on her 09/10/2018 revealing a total mid RCA with high-grade proximal LAD disease and severe aortic stenosis.  She ultimately underwent CABG x2 with a LIMA to her LAD and a vein to the PDA as well as pericardial tissue aortic valve replacement by Dr. Luna Salinas 09/24/2018 and was discharged a week later.  She is recuperating nicely.  Her symptoms preoperative dyspnea have resolved.   She had a 2D echo performed 01/21/2019 that revealed normal LV systolic function  with a well-functioning aortic bioprosthesis.  The thoracic aorta measured 4.7 cm on that study and therefore follow-up CT of her chest was done to confirm this which only showed the aortic dimension of 41 mm.  She still still complains of some shortness of breath but markedly improved since her aortic valve replacement.  She recently returned from a 9-day trip to [facility] [place]  which was therapeutic for her.   Since I saw her a year ago she continues to do well.  She walks with a cane.  She denies chest pain or shortness of breath.  Her lipid profile has been optimized by Dr. Maximo Spar on Praluent  and Zetia  with an LDL of 61.  2D echo performed/20/25 revealed normal LV systolic function with a well-functioning aortic bioprosthesis.   Current Meds  Medication Sig   Alirocumab  150 MG/ML SOAJ Inject 1 mL (150 mg total) into the skin every 14 (fourteen) days.   Alirocumab  150 MG/ML SOAJ Inject 1 mL (150 mg total) into the skin every 14 (fourteen) days.   allopurinol  (ZYLOPRIM ) 100 MG tablet Take 1 tablet (100 mg total) by mouth daily.   amLODipine  (NORVASC ) 10 MG tablet Take 5 mg by mouth daily.   aspirin  81 MG EC tablet Take 1 tablet (81 mg total) by mouth daily.   azithromycin  (ZITHROMAX ) 500 MG tablet Take 1 tablet (500 mg total) by mouth as needed (for dental procedures). Take 1 hour prior to dental procedure.   Cholecalciferol (VITAMIN D ) 2000 units  tablet Take 1 tablet by mouth daily.   Cyanocobalamin (VITAMIN B 12 PO) Take 1 tablet by mouth daily.   diclofenac Sodium (VOLTAREN) 1 % GEL Apply topically.   dicyclomine  (BENTYL ) 20 MG tablet Take 20 mg by mouth 2 (two) times daily as needed for spasms.   ezetimibe  (ZETIA ) 10 MG tablet Take 10 mg by mouth daily.   hydrochlorothiazide  (MICROZIDE ) 12.5 MG capsule Take 1 capsule by mouth once daily   levothyroxine  (SYNTHROID ) 100 MCG tablet Take 100 mcg by mouth daily before breakfast.   losartan  (COZAAR ) 50 MG tablet Take 1 tablet (50 mg total) by  mouth daily.   meclizine  (ANTIVERT ) 25 MG tablet Take 25 mg by mouth 3 (three) times daily as needed for dizziness.   metoprolol  tartrate (LOPRESSOR ) 25 MG tablet TAKE 1 TABLET BY MOUTH TWICE DAILY . APPOINTMENT REQUIRED FOR FUTURE REFILLS   traMADol  (ULTRAM ) 50 MG tablet Take 50 mg by mouth every 4-6 hours PRN severe pain     Allergies  Allergen Reactions   Keflex  [Cephalexin ] Itching   Repatha  [Evolocumab ]     Migraine, IBS flaire up, and hair loss   Icosapent  Ethyl (Epa Ethyl Ester) (Fish) Other (See Comments)      patient states gives  "gas"   Rosuvastatin  Other (See Comments)    Muscle aches   Amoxicillin Rash    Has patient had a PCN reaction causing immediate rash, facial/tongue/throat swelling, SOB or lightheadedness with hypotension: No Has patient had a PCN reaction causing severe rash involving mucus membranes or skin necrosis: No Has patient had a PCN reaction that required hospitalization: No Has patient had a PCN reaction occurring within the last 10 years: No If all of the above answers are "NO", then may proceed with Cephalosporin use.     Amoxicillin-Pot Clavulanate Rash    Has patient had a PCN reaction causing immediate rash, facial/tongue/throat swelling, SOB or lightheadedness with hypotension: No Has patient had a PCN reaction causing severe rash involving mucus membranes or skin necrosis: No Has patient had a PCN reaction that required hospitalization: No Has patient had a PCN reaction occurring within the last 10 years: No If all of the above answers are "NO", then may proceed with Cephalosporin use.    Ampicillin Rash   Clarithromycin Rash   Clopidogrel Bisulfate Rash   Nitrofurantoin Itching   Plavix [Clopidogrel Bisulfate] Rash   Zestril [Lisinopril] Rash    Social History   Socioeconomic History   Marital status: Widowed    Spouse name: Not on file   Number of children: Not on file   Years of education: Not on file   Highest education level:  Not on file  Occupational History   Occupation: Retired  Tobacco Use   Smoking status: Former    Current packs/day: 0.00    Average packs/day: 0.8 packs/day for 20.0 years (16.0 ttl pk-yrs)    Types: Cigarettes    Start date: 09/21/1967    Quit date: 09/21/1987    Years since quitting: 36.6   Smokeless tobacco: Never   Tobacco comments:    socially  Vaping Use   Vaping status: Never Used  Substance and Sexual Activity   Alcohol  use: No   Drug use: No   Sexual activity: Not on file  Other Topics Concern   Not on file  Social History Narrative   Not on file   Social Drivers of Health   Financial Resource Strain: Not on file  Food Insecurity: Not on  file  Transportation Needs: No Transportation Needs (01/22/2019)   PRAPARE - Administrator, Civil Service (Medical): No    Lack of Transportation (Non-Medical): No  Physical Activity: Inactive (01/22/2019)   Exercise Vital Sign    Days of Exercise per Week: 0 days    Minutes of Exercise per Session: 0 min  Stress: No Stress Concern Present (01/22/2019)   [employer] of Occupational Health - Occupational Stress Questionnaire    Feeling of Stress : Only a little  Social Connections: Not on file  Intimate Partner Violence: Not on file     Review of Systems: General: negative for chills, fever, night sweats or weight changes.  Cardiovascular: negative for chest pain, dyspnea on exertion, edema, orthopnea, palpitations, paroxysmal nocturnal dyspnea or shortness of breath Dermatological: negative for rash Respiratory: negative for cough or wheezing Urologic: negative for hematuria Abdominal: negative for nausea, vomiting, diarrhea, bright red blood per rectum, melena, or hematemesis Neurologic: negative for visual changes, syncope, or dizziness All other systems reviewed and are otherwise negative except as noted above.    Blood pressure 120/70, pulse 77, height 5\' 5"  (1.651 m), weight 217 lb (98.4 kg), SpO2  92%.  General appearance: alert and no distress Neck: no adenopathy, no carotid bruit, no JVD, supple, symmetrical, trachea midline, and thyroid  not enlarged, symmetric, no tenderness/mass/nodules Lungs: clear to auscultation bilaterally Heart: regular rate and rhythm, S1, S2 normal, no murmur, click, rub or gallop Extremities: extremities normal, atraumatic, no cyanosis or edema Pulses: 2+ and symmetric Skin: Skin color, texture, turgor normal. No rashes or lesions Neurologic: Grossly normal  EKG EKG Interpretation Date/Time:  Tuesday May 07 2024 16:34:28 EDT Ventricular Rate:  77 PR Interval:  172 QRS Duration:  88 QT Interval:  378 QTC Calculation: 427 R Axis:   12  Text Interpretation: Normal sinus rhythm Nonspecific ST and T wave abnormality When compared with ECG of 25-Sep-2018 06:52, Premature ventricular complexes are no longer Present Premature atrial complexes are no longer Present Right bundle branch block is no longer Present Confirmed by Lauro Portal [phone]) on 05/07/2024 4:40:47 PM    ASSESSMENT AND PLAN:   HYPERCHOLESTEROLEMIA History of hyperlipidemia on Praluent  and Zetia  followed by Dr. Maximo Spar in the lipid clinic with recent lipid profile performed 12/14/2023 revealing total cholesterol 138, LDL 61 and HDL of 51.  Essential hypertension History of essential hypertension her blood pressure measured today 120/70.  She is on metoprolol , losartan  and hydrochlorothiazide .  S/P CABG x 2 History of CABG x 2 and AVR for severe aortic stenosis by Dr. Luna Salinas September 2019 after cardiac cath performed by myself 09/10/2018 revealed a total mid RCA with high-grade proximal LAD disease and severe aortic stenosis.  She had a pericardial tissue valve replaced as well for severe aortic stenosis.  She denies chest pain.  S/P aortic valve replacement with bioprosthetic valve Status post aortic valve replacement with a 23 mm Inspira's valve by Dr. Teofilo Fellers September 2019 at the  time of CABG x 2.  2D echo performed/20/25 revealed normal LV systolic function, grade 2 diastolic dysfunction and a well-functioning aortic bioprosthesis.  Thoracic aortic aneurysm (HCC) CTA performed 02/14/2021 revealed her ascending aorta measured 38 mm.     Avanell Leigh MD FACP,FACC,FAHA, [facility] 05/07/2024 4:52 PM

## 2024-05-13 ENCOUNTER — Ambulatory Visit: Admitting: Cardiovascular Disease

## 2024-06-04 ENCOUNTER — Other Ambulatory Visit: Payer: Self-pay | Admitting: Cardiovascular Disease

## 2024-06-13 LAB — NMR, LIPOPROFILE
Cholesterol, Total: 137 mg/dL (ref 100–199)
HDL Particle Number: 38.1 umol/L (ref >=30.5)
HDL-C: 45 mg/dL (ref >39)
LDL Particle Number: 652 nmol/L (ref <1000)
LDL Size: 19.7 nm — ABNORMAL LOW (ref >20.5)
LDL-C (NIH Calc): 48 mg/dL (ref 0–99)
LP-IR Score: 67 — ABNORMAL HIGH (ref <=45)
Small LDL Particle Number: 555 nmol/L — ABNORMAL HIGH (ref <=527)
Triglycerides: 285 mg/dL — ABNORMAL HIGH (ref 0–149)

## 2024-06-14 ENCOUNTER — Ambulatory Visit (HOSPITAL_BASED_OUTPATIENT_CLINIC_OR_DEPARTMENT_OTHER): Payer: Self-pay | Admitting: Internal Medicine

## 2024-06-17 NOTE — Telephone Encounter (Signed)
 Patient's is calling to follow up on lab results. Please advise.

## 2024-06-18 ENCOUNTER — Ambulatory Visit: Payer: PPO | Attending: Internal Medicine | Admitting: Internal Medicine

## 2024-06-18 ENCOUNTER — Encounter: Payer: Self-pay | Admitting: Internal Medicine

## 2024-06-18 VITALS — BP 146/92 | HR 68 | Ht 65.0 in | Wt 218.4 lb

## 2024-06-18 DIAGNOSIS — Z952 Presence of prosthetic heart valve: Secondary | ICD-10-CM | POA: Diagnosis not present

## 2024-06-18 DIAGNOSIS — E785 Hyperlipidemia, unspecified: Secondary | ICD-10-CM | POA: Diagnosis not present

## 2024-06-18 DIAGNOSIS — Z951 Presence of aortocoronary bypass graft: Secondary | ICD-10-CM

## 2024-06-18 DIAGNOSIS — T466X5D Adverse effect of antihyperlipidemic and antiarteriosclerotic drugs, subsequent encounter: Secondary | ICD-10-CM

## 2024-06-18 DIAGNOSIS — M791 Myalgia, unspecified site: Secondary | ICD-10-CM

## 2024-06-18 NOTE — Progress Notes (Signed)
 LIPID CLINIC CONSULT NOTE  Chief Complaint:  Follow-up dyslipidemia  Primary Care Physician: Duwayne Katheryn HERO, [place]  Primary Cardiologist:  Dorn Lesches, MD  HPI:  [name] [name] is a 78 y.o. female who is being seen today for the evaluation of dyslipidemia at the request of Dorn Lesches, MD. WANDALEE [name] is a 78 y.o. female who presents via audio/video conferencing for a telehealth visit today.  This is a pleasant 78 year old female with longstanding dyslipidemia.  She has a history of high LDL cholesterol as well as high triglycerides, once over 800.  More recently she had progressive aortic valve disease and developed severe aortic stenosis and was found to have two-vessel coronary disease.  She underwent CABG x2, with LIMA to LAD and SVG to PDA and replacement of the aortic valve.  She has been relatively intolerant to statins but currently is on low-dose rosuvastatin  10 mg twice weekly.  She said even with this she noted some worsening of her muscle aches and joint pain.  She also has some advanced arthritis.  After stopping the statin for about a week she noted some improvement.  Previously she had been on Repatha  with marked improvement in her numbers and achieved an LDL of 42, however she did report side effects including migraine headaches, worsening of IBS symptoms and hair loss which she said improved significantly after stopping the injections 4 months.  She came off of the Repatha  back in December and recently had repeat labs in March 2021, that demonstrated total cholesterol 177, triglycerides 225, HDL 45 and LDL 94.  This represents therapy on rosuvastatin  10 mg twice weekly.  She reports diet has been stable if not become somewhat healthier as she has gotten older.  08/13/2020  [name] [name] returns today for office follow-up.  I last seen her virtually.  Plan was to adjust her statin regimen due to side effects and I recommended taking rosuvastatin  5 mg every Monday Wednesday and  Friday and adding ezetimibe  10 mg daily to her regimen.  This has resulted in overall reduction in her lipids.  Total cholesterol now 165, triglycerides 223, HDL 40 and LDL 87.  Her hemoglobin A1c is 5.9.  She still remains above target LDL and her triglycerides are higher than ideal.  We discussed other possible options including adding Vascepa .  She seemed interested but was concerned about issues as particular swallowing pills given her history of IBS and other side effects.   02/18/2021  [name] [name] returns for follow-up.  Unfortunately she could not tolerate Repatha , rosuvastatin  or Vascepa  due to side effects. Cholesterol is much higher. Total is up to 271 from 165 and her LDL is up to 169 from 87.  She will need additional therapies.  We discussed the possibility of Praluent  which although is very similar can be tolerated slightly different.  This may be an option for her.  We could also consider evaluation for inclisiran.   12/15/2023  [name] [name] returns today for follow-up.  Overall she seems to be doing pretty well.  She had lipids back in September which showed total cholesterol 186, triglycerides 277, HDL 50 and LDL 96.  Her LDL has been creeping up somewhat.  Vitamin D  levels are normal at 52.9.  She discussed this with her PCP and decided she wanted to add in Zetia  again.  She thought she might have some side effects with her irritable bowel syndrome, but seems to be tolerated.  She did have repeat lipid testing  yesterday however that has not yet resulted.  Overall she seems to be tolerating the combination therapy.  06/18/2024  [name] [name] is seen today in follow-up.  She has done well on lipid-lowering therapy with Praluent  and ezetimibe  which was briefly stopped but then restarted.  Repeat labs now showed LDL particle number of 652 with an LDL 48, HDL 45 and triglycerides 285.  She has a history of high triglycerides which again is likely genetic.  She could not tolerate Vascepa  and is not  interested in additional medications.  I encouraged exercise and activity although she has a number of limitations to that.  PMHx:  Past Medical History:  Diagnosis Date   Aortic stenosis, severe 08/2018   progressed from moderate by ECHO 01-03-17    Arthritis    Cancer (HCC)    basal cell skin cancer   Carotid artery bruit    Cataract    bilateral repaired   Claustrophobia    Coronary artery disease    Diverticulosis    Family history of heart disease    GERD (gastroesophageal reflux disease)    Gout    Heart murmur    repaired   History of hiatal hernia    History of kidney stones    Hyperlipemia    Hypertension    Irritable bowel syndrome    Stroke [facility])    old- 25 yrs ago found on a test.   Thyroid  disease    UTI (urinary tract infection) 03/09/14    Past Surgical History:  Procedure Laterality Date   AORTIC VALVE REPLACEMENT N/A 09/24/2018   Procedure: AORTIC VALVE REPLACEMENT (AVR) using  an INSPIRIS RESILIA  Prosthetic Valve (Model #: 11500A, Size: , Serial #: [id number]);  Surgeon: Kerrin Elspeth BROCKS, MD;  Location: [facility] OR;  Service: Open Heart Surgery;  Laterality: N/A;   APPENDECTOMY     taken out with hysterectomy   BLADDER SURGERY     Lift    CARDIAC CATHETERIZATION     COLONOSCOPY  2015   CORONARY ARTERY BYPASS GRAFT N/A 09/24/2018   Procedure: CORONARY ARTERY BYPASS GRAFTING (CABG) x 2; (LIMA to LAD, SVG to PDA) using RIGHT THIGH GREATER SAPHENOUS VEIN GRAFT (SVG) and LEFT INTERNAL MAMMARY ARTERY (LIMA): LIMA to LAD, SVG to PDA ;  Surgeon: Kerrin Elspeth BROCKS, MD;  Location: [facility] OR;  Service: Open Heart Surgery;  Laterality: N/A;   EYE SURGERY Bilateral    cataract surgery with lens implants   lithotripsy   within last 10 years   OOPHORECTOMY     PARTIAL HYSTERECTOMY     RIGHT/LEFT HEART CATH AND CORONARY ANGIOGRAPHY N/A 09/10/2018   Procedure: RIGHT/LEFT HEART CATH AND CORONARY ANGIOGRAPHY;  Surgeon: Court Dorn PARAS, MD;  Location: [facility] INVASIVE CV LAB;   Service: Cardiovascular;  Laterality: N/A;   TEE WITHOUT CARDIOVERSION N/A 09/24/2018   Procedure: TRANSESOPHAGEAL ECHOCARDIOGRAM (TEE);  Surgeon: Kerrin Elspeth BROCKS, MD;  Location: [facility] OR;  Service: Open Heart Surgery;  Laterality: N/A;   TOTAL KNEE ARTHROPLASTY Right 07/24/2017   Procedure: RIGHT TOTAL KNEE ARTHROPLASTY;  Surgeon: Melodi Lerner, MD;  Location: [facility] ORS;  Service: Orthopedics;  Laterality: Right;  Adductor Block    FAMHx:  Family History  Problem Relation Age of Onset   Heart disease Mother    Heart disease Father    Colon cancer Neg Hx    Colon polyps Neg Hx    Esophageal cancer Neg Hx    Rectal cancer Neg Hx    Stomach cancer  Neg Hx     SOCHx:   reports that she quit smoking about 36 years ago. Her smoking use included cigarettes. She started smoking about 56 years ago. She has a 16 pack-year smoking history. She has never used smokeless tobacco. She reports that she does not drink alcohol  and does not use drugs.  ALLERGIES:  Allergies  Allergen Reactions   Keflex  [Cephalexin ] Itching   Repatha  [Evolocumab ]     Migraine, IBS flaire up, and hair loss   Icosapent  Ethyl (Epa Ethyl Ester) (Fish) Other (See Comments)      patient states gives  gas   Rosuvastatin  Other (See Comments)    Muscle aches   Amoxicillin Rash    Has patient had a PCN reaction causing immediate rash, facial/tongue/throat swelling, SOB or lightheadedness with hypotension: No Has patient had a PCN reaction causing severe rash involving mucus membranes or skin necrosis: No Has patient had a PCN reaction that required hospitalization: No Has patient had a PCN reaction occurring within the last 10 years: No If all of the above answers are NO, then may proceed with Cephalosporin use.     Amoxicillin-Pot Clavulanate Rash    Has patient had a PCN reaction causing immediate rash, facial/tongue/throat swelling, SOB or lightheadedness with hypotension: No Has patient had a PCN reaction causing  severe rash involving mucus membranes or skin necrosis: No Has patient had a PCN reaction that required hospitalization: No Has patient had a PCN reaction occurring within the last 10 years: No If all of the above answers are NO, then may proceed with Cephalosporin use.    Ampicillin Rash   Clarithromycin Rash   Clopidogrel Bisulfate Rash   Nitrofurantoin Itching   Plavix [Clopidogrel Bisulfate] Rash   Zestril [Lisinopril] Rash    ROS: Pertinent items noted in HPI and remainder of comprehensive ROS otherwise negative.  HOME MEDS: Current Outpatient Medications on File Prior to Visit  Medication Sig Dispense Refill   Alirocumab  150 MG/ML SOAJ Inject 1 mL (150 mg total) into the skin every 14 (fourteen) days. 6 mL 3   allopurinol  (ZYLOPRIM ) 100 MG tablet Take 1 tablet (100 mg total) by mouth daily. 30 tablet 0   amLODipine  (NORVASC ) 10 MG tablet Take 5 mg by mouth daily.     aspirin  81 MG EC tablet Take 1 tablet (81 mg total) by mouth daily.     Cholecalciferol (VITAMIN D ) 2000 units tablet Take 1 tablet by mouth daily.     Cyanocobalamin (VITAMIN B 12 PO) Take 1 tablet by mouth daily.     dicyclomine  (BENTYL ) 20 MG tablet Take 20 mg by mouth 2 (two) times daily as needed for spasms.     ezetimibe  (ZETIA ) 10 MG tablet Take 10 mg by mouth daily.     hydrochlorothiazide  (MICROZIDE ) 12.5 MG capsule Take 1 capsule by mouth once daily 90 capsule 3   levothyroxine  (SYNTHROID ) 100 MCG tablet Take 100 mcg by mouth daily before breakfast.     losartan  (COZAAR ) 50 MG tablet Take 1 tablet (50 mg total) by mouth daily. 90 tablet 0   meclizine  (ANTIVERT ) 25 MG tablet Take 25 mg by mouth 3 (three) times daily as needed for dizziness.     metoprolol  tartrate (LOPRESSOR ) 25 MG tablet TAKE 1 TABLET BY MOUTH TWICE DAILY . APPOINTMENT REQUIRED FOR FUTURE REFILLS 180 tablet 0   traMADol  (ULTRAM ) 50 MG tablet Take 50 mg by mouth every 4-6 hours PRN severe pain 30 tablet 0   Alirocumab  150  MG/ML SOAJ  Inject 1 mL (150 mg total) into the skin every 14 (fourteen) days. (Patient not taking: Reported on 06/18/2024) 6 mL 3   azithromycin  (ZITHROMAX ) 500 MG tablet Take 1 tablet (500 mg total) by mouth as needed (for dental procedures). Take 1 hour prior to dental procedure. (Patient not taking: Reported on 06/18/2024) 1 tablet 4   diclofenac Sodium (VOLTAREN) 1 % GEL Apply topically. (Patient not taking: Reported on 06/18/2024)     No current facility-administered medications on file prior to visit.    LABS/IMAGING: No results found for this or any previous visit (from the past 48 hours).  No results found.  LIPID PANEL:    Component Value Date/Time   CHOL 171 01/05/2023 0836   TRIG 279 (H) 01/05/2023 0836   HDL 45 01/05/2023 0836   CHOLHDL 3.8 01/05/2023 0836   LDLCALC 80 01/05/2023 0836    WEIGHTS: Wt Readings from Last 3 Encounters:  06/18/24 218 lb 6.4 oz (99.1 kg)  05/07/24 217 lb (98.4 kg)  12/15/23 216 lb 9.6 oz (98.2 kg)    VITALS: BP (!) 146/92 (BP Location: Left Arm, Patient Position: Sitting, Cuff Size: Normal)   Pulse 68   Ht 5' 5 (1.651 m)   Wt 218 lb 6.4 oz (99.1 kg)   SpO2 96%   BMI 36.34 kg/m   EXAM: Deferred  EKG: Deferred  ASSESSMENT: Mixed dyslipidemia, goal LDL less than 70 Relative statin intolerance-myalgias Repatha  intolerant Coronary artery disease status post CABG x2 Aortic stenosis status post AVR  PLAN: 1.   Ms. Sassone has good control of her lipids although her triglycerides are still somewhat elevated.  She could not tolerate Vascepa  there is little evidence for cardiovascular risk reduction on other therapies to lower triglycerides.  Exercise would be helpful although there are some limitations to that.  Overall she is doing fairly well.  I believe that follow-up refills of her Praluent  or ezetimibe  could be facilitated by Dr. Court and our pharmacy tech staff.  Follow-up with me as needed.  Vinie KYM Maxcy, MD, [facility], FNLA, FACP  [facility]  Medical Director of the Advanced Lipid Disorders &  Cardiovascular Risk Reduction Clinic Diplomate of the American Board of Clinical Lipidology Attending Cardiologist  Direct Dial: [phone]  Fax: [phone]  Website:  [email or web address] Vinie BROCKS Danis [place] 06/18/2024, 10:01 AM

## 2024-06-18 NOTE — Patient Instructions (Signed)
 Medication Instructions:  Your physician recommends that you continue on your current medications as directed. Please refer to the Current Medication list given to you today.  *If you need a refill on your cardiac medications before your next appointment, please call your pharmacy*  Follow-Up: At [facility], you and your health needs are our priority.  As part of our continuing mission to provide you with exceptional heart care, our providers are all part of one team.  This team includes your primary Cardiologist (physician) and Advanced Practice Providers or APPs (Physician Assistants and Nurse Practitioners) who all work together to provide you with the care you need, when you need it.  Your next appointment:   AS NEEDED

## 2024-06-20 ENCOUNTER — Telehealth: Payer: Self-pay | Admitting: Internal Medicine

## 2024-06-20 NOTE — Telephone Encounter (Signed)
 Patient just wanted to let Dr. Mona know that she appreciates everything he has done for her. She mentions that she is signing up for a gym that has a pool to exercise in as he suggested.--FYI.

## 2024-09-12 ENCOUNTER — Telehealth: Payer: Self-pay | Admitting: Cardiovascular Disease

## 2024-09-12 MED ORDER — AZITHROMYCIN 500 MG PO TABS: 500.0000 mg | ORAL_TABLET | ORAL | 4 refills | Status: AC | PRN

## 2024-09-12 NOTE — Telephone Encounter (Signed)
*  STAT* If patient is at the pharmacy, call can be transferred to refill team.   1. Which medications need to be refilled? (please list name of each medication and dose if known) azithromycin  (ZITHROMAX ) 500 MG tablet    2. Would you like to learn more about the convenience, safety, & potential cost savings by using the [facility]? no    3. Are you open to using the [facility] (Type [facility].  ).no   4. Which pharmacy/location (including street and city if local pharmacy) is medication to be sent to?  [facility] - [place], [place] - [address]    5. Do they need a 30 day or 90 day supply? 5 day

## 2024-09-12 NOTE — Telephone Encounter (Signed)
 Pt's medication was sent to pt's pharmacy as requested. Confirmation received.
